# Patient Record
Sex: Female | Born: 1945 | Race: White | Hispanic: No | Smoking: Never smoker
Health system: Southern US, Community
[De-identification: ages and names within clinical notes are randomized; demographics above are authoritative.]

## PROBLEM LIST (undated history)

## (undated) DIAGNOSIS — K219 Gastro-esophageal reflux disease without esophagitis: Secondary | ICD-10-CM

## (undated) DIAGNOSIS — J9690 Respiratory failure, unspecified, unspecified whether with hypoxia or hypercapnia: Secondary | ICD-10-CM

## (undated) DIAGNOSIS — I517 Cardiomegaly: Secondary | ICD-10-CM

## (undated) DIAGNOSIS — S82852A Displaced trimalleolar fracture of left lower leg, initial encounter for closed fracture: Secondary | ICD-10-CM

## (undated) DIAGNOSIS — F429 Obsessive-compulsive disorder, unspecified: Secondary | ICD-10-CM

## (undated) DIAGNOSIS — T8142XA Infection following a procedure, deep incisional surgical site, initial encounter: Secondary | ICD-10-CM

## (undated) DIAGNOSIS — D649 Anemia, unspecified: Secondary | ICD-10-CM

## (undated) DIAGNOSIS — R7989 Other specified abnormal findings of blood chemistry: Secondary | ICD-10-CM

## (undated) DIAGNOSIS — J189 Pneumonia, unspecified organism: Secondary | ICD-10-CM

## (undated) DIAGNOSIS — R000 Tachycardia, unspecified: Secondary | ICD-10-CM

## (undated) DIAGNOSIS — A419 Sepsis, unspecified organism: Secondary | ICD-10-CM

## (undated) DIAGNOSIS — R6520 Severe sepsis without septic shock: Secondary | ICD-10-CM

## (undated) DIAGNOSIS — Z8614 Personal history of Methicillin resistant Staphylococcus aureus infection: Secondary | ICD-10-CM

## (undated) DIAGNOSIS — F1110 Opioid abuse, uncomplicated: Secondary | ICD-10-CM

## (undated) DIAGNOSIS — M1990 Unspecified osteoarthritis, unspecified site: Secondary | ICD-10-CM

## (undated) DIAGNOSIS — R7881 Bacteremia: Secondary | ICD-10-CM

## (undated) DIAGNOSIS — E876 Hypokalemia: Secondary | ICD-10-CM

## (undated) DIAGNOSIS — F419 Anxiety disorder, unspecified: Secondary | ICD-10-CM

## (undated) DIAGNOSIS — R42 Dizziness and giddiness: Secondary | ICD-10-CM

## (undated) DIAGNOSIS — D62 Acute posthemorrhagic anemia: Secondary | ICD-10-CM

## (undated) DIAGNOSIS — N179 Acute kidney failure, unspecified: Secondary | ICD-10-CM

## (undated) DIAGNOSIS — S72142A Displaced intertrochanteric fracture of left femur, initial encounter for closed fracture: Secondary | ICD-10-CM

## (undated) DIAGNOSIS — D72829 Elevated white blood cell count, unspecified: Secondary | ICD-10-CM

## (undated) DIAGNOSIS — I10 Essential (primary) hypertension: Secondary | ICD-10-CM

## (undated) DIAGNOSIS — B019 Varicella without complication: Secondary | ICD-10-CM

## (undated) DIAGNOSIS — R079 Chest pain, unspecified: Secondary | ICD-10-CM

## (undated) DIAGNOSIS — W19XXXA Unspecified fall, initial encounter: Secondary | ICD-10-CM

## (undated) DIAGNOSIS — R0602 Shortness of breath: Secondary | ICD-10-CM

## (undated) DIAGNOSIS — F329 Major depressive disorder, single episode, unspecified: Secondary | ICD-10-CM

## (undated) DIAGNOSIS — F32A Depression, unspecified: Secondary | ICD-10-CM

## (undated) DIAGNOSIS — R55 Syncope and collapse: Secondary | ICD-10-CM

## (undated) HISTORY — DX: Depression, unspecified: F32.A

## (undated) HISTORY — DX: Hypokalemia: E87.6

## (undated) HISTORY — DX: Major depressive disorder, single episode, unspecified: F32.9

## (undated) HISTORY — DX: Unspecified fall, initial encounter: W19.XXXA

## (undated) HISTORY — DX: Varicella without complication: B01.9

## (undated) HISTORY — DX: Personal history of Methicillin resistant Staphylococcus aureus infection: Z86.14

## (undated) HISTORY — DX: Other specified abnormal findings of blood chemistry: R79.89

## (undated) HISTORY — DX: Sepsis, unspecified organism: R65.20

## (undated) HISTORY — DX: Chest pain, unspecified: R07.9

## (undated) HISTORY — DX: Dizziness and giddiness: R42

## (undated) HISTORY — DX: Elevated white blood cell count, unspecified: D72.829

## (undated) HISTORY — DX: Anemia, unspecified: D64.9

## (undated) HISTORY — DX: Sepsis, unspecified organism: A41.9

## (undated) HISTORY — PX: APPENDECTOMY: SHX54

## (undated) HISTORY — DX: Shortness of breath: R06.02

## (undated) HISTORY — PX: JOINT REPLACEMENT: SHX530

## (undated) HISTORY — PX: CHOLECYSTECTOMY OPEN: SUR202

## (undated) HISTORY — DX: Syncope and collapse: R55

## (undated) HISTORY — DX: Displaced trimalleolar fracture of left lower leg, initial encounter for closed fracture: S82.852A

## (undated) HISTORY — DX: Respiratory failure, unspecified, unspecified whether with hypoxia or hypercapnia: J96.90

## (undated) SURGERY — Surgical Case
Anesthesia: *Unknown

---

## 1983-02-01 HISTORY — PX: TOTAL ABDOMINAL HYSTERECTOMY: SHX209

## 1999-03-19 ENCOUNTER — Other Ambulatory Visit: Admission: RE | Admit: 1999-03-19 | Discharge: 1999-03-19 | Payer: Self-pay | Admitting: Obstetrics & Gynecology

## 1999-03-23 ENCOUNTER — Encounter: Payer: Self-pay | Admitting: Obstetrics & Gynecology

## 1999-03-23 ENCOUNTER — Ambulatory Visit (HOSPITAL_COMMUNITY): Admission: RE | Admit: 1999-03-23 | Discharge: 1999-03-23 | Payer: Self-pay | Admitting: Obstetrics & Gynecology

## 1999-09-20 ENCOUNTER — Encounter: Admission: RE | Admit: 1999-09-20 | Discharge: 1999-09-20 | Payer: Self-pay | Admitting: Orthopaedic Surgery

## 1999-09-20 ENCOUNTER — Encounter: Payer: Self-pay | Admitting: Orthopaedic Surgery

## 2009-11-27 ENCOUNTER — Ambulatory Visit: Payer: Self-pay | Admitting: Ophthalmology

## 2009-12-08 ENCOUNTER — Ambulatory Visit: Payer: Self-pay | Admitting: Ophthalmology

## 2009-12-17 ENCOUNTER — Encounter: Admission: RE | Admit: 2009-12-17 | Discharge: 2009-12-17 | Payer: Self-pay | Admitting: Neurology

## 2011-06-15 DIAGNOSIS — W19XXXA Unspecified fall, initial encounter: Secondary | ICD-10-CM

## 2011-06-15 HISTORY — DX: Unspecified fall, initial encounter: W19.XXXA

## 2011-06-17 ENCOUNTER — Encounter: Payer: Self-pay | Admitting: Cardiology

## 2011-07-20 ENCOUNTER — Ambulatory Visit (INDEPENDENT_AMBULATORY_CARE_PROVIDER_SITE_OTHER): Payer: Medicare Other | Admitting: Cardiology

## 2011-07-20 VITALS — BP 130/54 | HR 74 | Ht 62.0 in | Wt 121.0 lb

## 2011-07-20 DIAGNOSIS — Z8249 Family history of ischemic heart disease and other diseases of the circulatory system: Secondary | ICD-10-CM

## 2011-07-20 DIAGNOSIS — I2089 Other forms of angina pectoris: Secondary | ICD-10-CM

## 2011-07-20 DIAGNOSIS — R55 Syncope and collapse: Secondary | ICD-10-CM

## 2011-07-20 DIAGNOSIS — R002 Palpitations: Secondary | ICD-10-CM

## 2011-07-20 DIAGNOSIS — E7800 Pure hypercholesterolemia, unspecified: Secondary | ICD-10-CM

## 2011-07-20 DIAGNOSIS — R079 Chest pain, unspecified: Secondary | ICD-10-CM

## 2011-07-20 DIAGNOSIS — I208 Other forms of angina pectoris: Secondary | ICD-10-CM

## 2011-07-20 NOTE — Assessment & Plan Note (Signed)
Her symptoms are very concerning for coronary disease. Her primary risk factors are her family history and mild hypercholesterolemia. We will schedule her for a stress echo to further stratify her risk. If abnormal we will need to proceed with cardiac catheterization. It is possible that her chest pain symptoms could be related to an arrhythmia. If her stress test is normal we will place an event monitor.

## 2011-07-20 NOTE — Progress Notes (Signed)
[name] Date of Birth: 1945/12/28 Medical Record #[id number]  History of Present Illness: [name] date is seen at the request of Elin Claggett [facility] for evaluation of chest pain and palpitations. She is a 66 year old white female who has a strong family history of early coronary disease. She reports that over the past 2 weeks she has had problems with her heart racing fast. This is associated with severe dizziness and near syncope. Also associated with sweating. This is worse with exertion. With these symptoms she is also experienced severe chest discomfort like something is sitting on her chest. This is a pressure in her left precordium. If she stops and rests her symptoms resolved within 15-20 minutes. The symptoms also are associated with exertion. She has noticed this when she is mowing grass when she walks out to feed her dogs. She has and prescribe nitroglycerin has tried this on 3 occasions with improvement in her chest pain symptoms. She complains that she get short of breath very easily. She feels like she has run a marathon. She denies any prior cardiac history. She denies a history of hypertension, diabetes, hyperlipidemia, or tobacco use. She reports she had a stress test 10 years ago for some similar symptoms and that it was unremarkable.  Current Outpatient Prescriptions on File Prior to Visit  Medication Sig Dispense Refill  . FLUVOXAMINE MALEATE PO Take 50 mg by mouth at bedtime.      . nitroGLYCERIN (NITROSTAT) 0.4 MG SL tablet Place 0.4 mg under the tongue every 5 (five) minutes as needed.      . sertraline (ZOLOFT) 50 MG tablet Take 50 mg by mouth daily.        Allergies  Allergen Reactions  . Sulfa Antibiotics     Past Medical History  Diagnosis Date  . Chicken pox   . Chest pain   . SOB (shortness of breath) on exertion   . Fall 06/15/2011    Caused discomfort in lower left rib and left lateral hip area  . H/O: hysterectomy 1985  . Depressive disorder   .  Pre-syncope   . Dizziness     Past Surgical History  Procedure Date  . Gallbladder surgery   . Appendectomy   . Total abdominal hysterectomy     History  Smoking status  . Never Smoker   Smokeless tobacco  . Not on file    History  Alcohol Use No    Family History  Problem Relation Age of Onset  . Heart attack Mother   . Colon cancer Father   . Diabetes Brother   . Heart attack Brother 19    MI    Review of Systems: The review of systems is positive for a recent cyst removal from her left anterior chest.  All other systems were reviewed and are negative.  Physical Exam: BP 130/54  Pulse 74  Ht 5\' 2"  (1.575 m)  Wt 121 lb (54.885 kg)  BMI 22.13 kg/m2 She is a pleasant, thin white female in no acute distress. She is normocephalic, atraumatic. Pupils are equal round and reactive to light accommodation. Extraocular movements are intact. Oropharynx is clear. Neck is supple no JVD, adenopathy, thyromegaly, or bruits. Lungs are clear. Cardiac exam reveals a regular rate and rhythm without gallop, murmur, or click. She has a bandage on her left anterior chest. Abdomen is soft and nontender without masses or bruits. Bowel sounds are positive. There is no hepatosplenomegaly. Femoral and pedal pulses are 2+ and  symmetric. She has no edema. Skin is warm and dry. She is alert oriented x3. Cranial nerves II through XII are intact.   LABORATORY DATA: ECG today is normal. Recent CBC reveals a white count of 7300, hemoglobin 11.9, platelet count 217,000. Total cholesterol was 186, triglycerides 71, LDL 119, HDL 53. Complete chemistry panel was normal. Recent x-ray left hip showed mild degenerative changes. X-ray of the left ribs showed a minimally displaced fracture of the ninth rib.  Assessment / Plan:

## 2011-07-20 NOTE — Patient Instructions (Signed)
We will schedule you for a stress Echo. If abnormal we will need to do a cardiac cath. If normal we will have you wear a monitor.  Take an ASA 81 mg daily.

## 2011-07-22 ENCOUNTER — Encounter: Payer: Self-pay | Admitting: Cardiology

## 2011-07-22 ENCOUNTER — Ambulatory Visit (HOSPITAL_COMMUNITY): Payer: Medicare Other | Attending: Cardiovascular Disease

## 2011-07-22 DIAGNOSIS — Z8249 Family history of ischemic heart disease and other diseases of the circulatory system: Secondary | ICD-10-CM | POA: Insufficient documentation

## 2011-07-22 DIAGNOSIS — R079 Chest pain, unspecified: Secondary | ICD-10-CM

## 2011-07-22 DIAGNOSIS — R072 Precordial pain: Secondary | ICD-10-CM

## 2011-07-22 DIAGNOSIS — R55 Syncope and collapse: Secondary | ICD-10-CM | POA: Insufficient documentation

## 2011-07-22 DIAGNOSIS — R002 Palpitations: Secondary | ICD-10-CM | POA: Insufficient documentation

## 2011-07-22 NOTE — Progress Notes (Signed)
Echocardiogram performed.  

## 2011-07-25 ENCOUNTER — Encounter: Payer: Self-pay | Admitting: Cardiology

## 2011-07-27 ENCOUNTER — Other Ambulatory Visit: Payer: Self-pay

## 2011-07-27 DIAGNOSIS — R002 Palpitations: Secondary | ICD-10-CM

## 2011-07-29 ENCOUNTER — Telehealth: Payer: Self-pay | Admitting: Cardiology

## 2011-07-29 NOTE — Telephone Encounter (Signed)
Please return call to patient at [phone].

## 2011-07-29 NOTE — Telephone Encounter (Signed)
Patient came to office event monitor was put on.Patient called back to tell me monitor serial # [id number].

## 2011-07-31 ENCOUNTER — Encounter: Payer: Self-pay | Admitting: Cardiology

## 2011-07-31 DIAGNOSIS — R002 Palpitations: Secondary | ICD-10-CM

## 2011-09-20 ENCOUNTER — Ambulatory Visit (INDEPENDENT_AMBULATORY_CARE_PROVIDER_SITE_OTHER): Payer: Medicare Other | Admitting: Cardiology

## 2011-09-20 ENCOUNTER — Encounter: Payer: Self-pay | Admitting: Cardiology

## 2011-09-20 VITALS — BP 140/68 | HR 70 | Ht 62.0 in | Wt 123.4 lb

## 2011-09-20 DIAGNOSIS — R002 Palpitations: Secondary | ICD-10-CM

## 2011-09-20 DIAGNOSIS — R079 Chest pain, unspecified: Secondary | ICD-10-CM

## 2011-09-20 NOTE — Patient Instructions (Signed)
Start regular aerobic walking daily to build up your stamina  I will see you back as needed.

## 2011-09-20 NOTE — Progress Notes (Signed)
[name] Date of Birth: 02-Sep-1945 Medical Record #[id number]  History of Present Illness: [name] date is seen  today to followup on her cardiac studies. She does note that when she were the event monitor she had several episodes were her heart felt like it was racing and she activated the monitor. She denies any significant chest pain today.  Current Outpatient Prescriptions on File Prior to Visit  Medication Sig Dispense Refill  . diazepam (VALIUM) 5 MG tablet Take 5 mg by mouth as directed.      Marland Kitchen FLUoxetine (PROZAC) 40 MG capsule Take 40 mg by mouth as directed.       Marland Kitchen FLUVOXAMINE MALEATE PO Take 50 mg by mouth at bedtime.      . nitroGLYCERIN (NITROSTAT) 0.4 MG SL tablet Place 0.4 mg under the tongue every 5 (five) minutes as needed.      . sertraline (ZOLOFT) 50 MG tablet Take 50 mg by mouth daily.        Allergies  Allergen Reactions  . Sulfa Antibiotics     Past Medical History  Diagnosis Date  . Chicken pox   . Chest pain   . SOB (shortness of breath) on exertion   . Fall 06/15/2011    Caused discomfort in lower left rib and left lateral hip area  . H/O: hysterectomy 1985  . Depressive disorder   . Pre-syncope   . Dizziness     Past Surgical History  Procedure Date  . Gallbladder surgery   . Appendectomy   . Total abdominal hysterectomy     History  Smoking status  . Never Smoker   Smokeless tobacco  . Not on file    History  Alcohol Use No    Family History  Problem Relation Age of Onset  . Heart attack Mother   . Colon cancer Father   . Diabetes Brother   . Heart attack Brother 35    MI    Review of Systems: As noted in history of present illness.  All other systems were reviewed and are negative.  Physical Exam: BP 140/68  Pulse 70  Ht 5\' 2"  (1.575 m)  Wt 55.974 kg (123 lb 6.4 oz)  BMI 22.57 kg/m2  SpO2 99% She is a pleasant, thin white female in no acute distress. She is normocephalic, atraumatic. Pupils are equal round  and reactive to light accommodation. Extraocular movements are intact. Oropharynx is clear. Neck is supple no JVD, adenopathy, thyromegaly, or bruits. Lungs are clear. Cardiac exam reveals a regular rate and rhythm without gallop, murmur, or click. She has a bandage on her left anterior chest. Abdomen is soft and nontender without masses or bruits. Bowel sounds are positive. There is no hepatosplenomegaly. Femoral and pedal pulses are 2+ and symmetric. She has no edema. Skin is warm and dry. She is alert oriented x3. Cranial nerves II through XII are intact.   LABORATORY DATA: Her event monitor showed multiple recordings withdrawal sinus rhythm with sinus tachycardia. Maximal pulse rate was 117.  Stress echo was normal. Ejection fraction was normal. Patient is able to exercise into stage III.  Assessment / Plan:  1. Palpitations. Event monitor demonstrates only sinus rhythm with sinus tachycardia. I've reassured her concerning these findings.  2. Chest pain. Stress echo was normal. I recommend continued risk factor modification. I would like for her to begin a regular aerobic walking program to try and build up her stamina and conditioning. Continue to work on risk  factor modification. If she notices a significant change in her pattern of chest pain I asked that she call me.  Peter Swaziland MD, [facility]

## 2013-07-23 ENCOUNTER — Emergency Department (HOSPITAL_COMMUNITY)
Admission: EM | Admit: 2013-07-23 | Discharge: 2013-07-23 | Disposition: A | Discharge status: Home / Self Care | Payer: Medicare Other | Attending: Emergency Medicine | Admitting: Emergency Medicine

## 2013-07-23 ENCOUNTER — Emergency Department (HOSPITAL_COMMUNITY): Payer: Medicare Other

## 2013-07-23 ENCOUNTER — Encounter (HOSPITAL_COMMUNITY): Payer: Self-pay | Admitting: Emergency Medicine

## 2013-07-23 DIAGNOSIS — R002 Palpitations: Secondary | ICD-10-CM | POA: Insufficient documentation

## 2013-07-23 DIAGNOSIS — R51 Headache: Secondary | ICD-10-CM | POA: Insufficient documentation

## 2013-07-23 DIAGNOSIS — F329 Major depressive disorder, single episode, unspecified: Secondary | ICD-10-CM | POA: Diagnosis not present

## 2013-07-23 DIAGNOSIS — Z79899 Other long term (current) drug therapy: Secondary | ICD-10-CM | POA: Insufficient documentation

## 2013-07-23 DIAGNOSIS — R5383 Other fatigue: Principal | ICD-10-CM

## 2013-07-23 DIAGNOSIS — R5381 Other malaise: Secondary | ICD-10-CM | POA: Diagnosis present

## 2013-07-23 DIAGNOSIS — Z8619 Personal history of other infectious and parasitic diseases: Secondary | ICD-10-CM | POA: Diagnosis not present

## 2013-07-23 DIAGNOSIS — R42 Dizziness and giddiness: Secondary | ICD-10-CM | POA: Insufficient documentation

## 2013-07-23 DIAGNOSIS — R110 Nausea: Secondary | ICD-10-CM | POA: Insufficient documentation

## 2013-07-23 DIAGNOSIS — R531 Weakness: Secondary | ICD-10-CM

## 2013-07-23 DIAGNOSIS — R55 Syncope and collapse: Secondary | ICD-10-CM | POA: Insufficient documentation

## 2013-07-23 DIAGNOSIS — F3289 Other specified depressive episodes: Secondary | ICD-10-CM | POA: Diagnosis not present

## 2013-07-23 LAB — URINALYSIS, ROUTINE W REFLEX MICROSCOPIC
Bilirubin Urine: NEGATIVE
Glucose, UA: NEGATIVE mg/dL
Ketones, ur: NEGATIVE mg/dL
NITRITE: NEGATIVE
Protein, ur: NEGATIVE mg/dL
SPECIFIC GRAVITY, URINE: 1.01 (ref 1.005–1.030)
UROBILINOGEN UA: 0.2 mg/dL (ref 0.0–1.0)
pH: 6 (ref 5.0–8.0)

## 2013-07-23 LAB — CBC WITH DIFFERENTIAL/PLATELET
Basophils Absolute: 0 10*3/uL (ref 0.0–0.1)
Basophils Relative: 0 % (ref 0–1)
EOS ABS: 0.1 10*3/uL (ref 0.0–0.7)
EOS PCT: 1 % (ref 0–5)
HEMATOCRIT: 36.7 % (ref 36.0–46.0)
Hemoglobin: 12.4 g/dL (ref 12.0–15.0)
LYMPHS ABS: 1.4 10*3/uL (ref 0.7–4.0)
LYMPHS PCT: 19 % (ref 12–46)
MCH: 30.3 pg (ref 26.0–34.0)
MCHC: 33.8 g/dL (ref 30.0–36.0)
MCV: 89.7 fL (ref 78.0–100.0)
MONO ABS: 0.6 10*3/uL (ref 0.1–1.0)
Monocytes Relative: 8 % (ref 3–12)
Neutro Abs: 5.4 10*3/uL (ref 1.7–7.7)
Neutrophils Relative %: 72 % (ref 43–77)
PLATELETS: 221 10*3/uL (ref 150–400)
RBC: 4.09 MIL/uL (ref 3.87–5.11)
RDW: 13.2 % (ref 11.5–15.5)
WBC: 7.6 10*3/uL (ref 4.0–10.5)

## 2013-07-23 LAB — COMPREHENSIVE METABOLIC PANEL
ALT: 12 U/L (ref 0–35)
AST: 17 U/L (ref 0–37)
Albumin: 4.2 g/dL (ref 3.5–5.2)
Alkaline Phosphatase: 131 U/L — ABNORMAL HIGH (ref 39–117)
BUN: 21 mg/dL (ref 6–23)
CALCIUM: 9.3 mg/dL (ref 8.4–10.5)
CO2: 26 mEq/L (ref 19–32)
Chloride: 101 mEq/L (ref 96–112)
Creatinine, Ser: 1.08 mg/dL (ref 0.50–1.10)
GFR calc Af Amer: 60 mL/min — ABNORMAL LOW (ref >90)
GFR, EST NON AFRICAN AMERICAN: 52 mL/min — AB (ref >90)
GLUCOSE: 117 mg/dL — AB (ref 70–99)
Potassium: 4.2 mEq/L (ref 3.7–5.3)
Sodium: 140 mEq/L (ref 137–147)
Total Bilirubin: 0.3 mg/dL (ref 0.3–1.2)
Total Protein: 7.5 g/dL (ref 6.0–8.3)

## 2013-07-23 LAB — URINE MICROSCOPIC-ADD ON

## 2013-07-23 LAB — TROPONIN I: Troponin I: <0.3 ng/mL (ref <0.30)

## 2013-07-23 MED ORDER — ACETAMINOPHEN 325 MG PO TABS
650.0000 mg | ORAL_TABLET | Freq: Once | ORAL | Status: AC
  Administered 2013-07-23: 650 mg via ORAL
  Filled 2013-07-23: qty 2

## 2013-07-23 MED ORDER — ONDANSETRON HCL 4 MG/2ML IJ SOLN
4.0000 mg | Freq: Once | INTRAMUSCULAR | Status: AC
  Administered 2013-07-23: 4 mg via INTRAVENOUS
  Filled 2013-07-23: qty 2

## 2013-07-23 MED ORDER — SODIUM CHLORIDE 0.9 % IV BOLUS (SEPSIS)
1000.0000 mL | Freq: Once | INTRAVENOUS | Status: AC
  Administered 2013-07-23: 1000 mL via INTRAVENOUS

## 2013-07-23 NOTE — ED Provider Notes (Signed)
CSN: [id number]     Arrival date & time 07/23/13  0857 History   First MD Initiated Contact with Patient 07/23/13 [phone]     Chief Complaint  Patient presents with  . Weakness     (Consider location/radiation/quality/duration/timing/severity/associated sxs/prior Treatment) HPI Comments: [name] is a 68 y.o. Female with a prior history of dizziness of unclear etiology presenting with dizziness and near syncope occuring 1 hour before arrival.  She woke this morning without symptoms but reports about after 20 minutes outside while feeding her dogs she developed lightheadedness "almost drunk feeling" and fell to the ground without loosing consciousness.  At the same time,  She developed a frontal headache, nausea and felt her heart was racing.  She denies chest pain and shortness of breath, also is without abdominal pain.  She has had no po intake this morning, but ate a normal dinner last night.  She denies any reason to be dehydrated, but was very hot while outside this morning.  She denies injury or pain secondary to the fall.  She has had no medicines prior to arrival.   Review of chart indicates she had cardiac w/u by Dr SwazilandJordan August 2013 for palpitations.  An event recorder documented several episodes of sinus tachycardia with maximum rate of 117.  Stress echo was normal. Ejection fraction was normal. Patient was able to exercise into stage III.      The history is provided by the patient.    Past Medical History  Diagnosis Date  . Chicken pox   . Chest pain   . SOB (shortness of breath) on exertion   . Fall 06/15/2011    Caused discomfort in lower left rib and left lateral hip area  . H/O: hysterectomy 1985  . Depressive disorder   . Pre-syncope   . Dizziness    Past Surgical History  Procedure Laterality Date  . Gallbladder surgery    . Appendectomy    . Total abdominal hysterectomy     Family History  Problem Relation Age of Onset  . Heart attack Mother   . Colon  cancer Father   . Diabetes Brother   . Heart attack Brother 1961    MI   History  Substance Use Topics  . Smoking status: Never Smoker   . Smokeless tobacco: Not on file  . Alcohol Use: No   OB History   Grav Para Term Preterm Abortions TAB SAB Ect Mult Living                 Review of Systems  Constitutional: Negative for fever.  HENT: Negative for congestion and sore throat.   Eyes: Negative.   Respiratory: Negative for chest tightness and shortness of breath.   Cardiovascular: Positive for palpitations. Negative for chest pain.  Gastrointestinal: Positive for nausea. Negative for vomiting and abdominal pain.  Genitourinary: Negative.   Musculoskeletal: Negative for arthralgias, joint swelling and neck pain.  Skin: Negative.  Negative for rash and wound.  Neurological: Positive for dizziness, weakness and headaches. Negative for light-headedness and numbness.  Psychiatric/Behavioral: Negative.       Allergies  Sulfa antibiotics  Home Medications   Prior to Admission medications   Medication Sig Start Date End Date Taking? Authorizing Shafter Jupin  diazepam (VALIUM) 5 MG tablet Take 5 mg by mouth as directed.    Historical Atlee Kluth, MD  FLUoxetine (PROZAC) 40 MG capsule Take 40 mg by mouth as directed.  09/05/11   Historical Kristain Hu, MD  FLUVOXAMINE  MALEATE PO Take 50 mg by mouth at bedtime.    Historical Khylan Sawyer, MD  nitroGLYCERIN (NITROSTAT) 0.4 MG SL tablet Place 0.4 mg under the tongue every 5 (five) minutes as needed.    Historical Colton Engdahl, MD  sertraline (ZOLOFT) 50 MG tablet Take 50 mg by mouth daily.    Historical Emi Lymon, MD   BP 131/49  Pulse 63  Temp(Src) 97.6 F (36.4 C) (Oral)  Resp 16  Ht 5\' 2"  (1.575 m)  Wt 119 lb (53.978 kg)  BMI 21.76 kg/m2  SpO2 100% Physical Exam  Nursing note and vitals reviewed. Constitutional: She is oriented to person, place, and time. She appears well-developed and well-nourished.  HENT:  Head: Normocephalic and  atraumatic.  Eyes: Conjunctivae and EOM are normal. Pupils are equal, round, and reactive to light.  Neck: Normal range of motion.  Cardiovascular: Normal rate, regular rhythm, normal heart sounds and intact distal pulses.   Pulmonary/Chest: Effort normal and breath sounds normal. She has no wheezes. She has no rales. She exhibits no tenderness.  Abdominal: Soft. Bowel sounds are normal. There is no tenderness.  Musculoskeletal: Normal range of motion. She exhibits no edema.  Neurological: She is alert and oriented to person, place, and time. She has normal strength. No sensory deficit. She exhibits normal muscle tone.  Equal grip strength.  Skin: Skin is warm and dry.  Psychiatric: She has a normal mood and affect.    ED Course  Procedures (including critical care time) Labs Review Labs Reviewed  CBC WITH DIFFERENTIAL  COMPREHENSIVE METABOLIC PANEL  TROPONIN I  URINALYSIS, ROUTINE W REFLEX MICROSCOPIC    Imaging Review No results found.   EKG Interpretation   Date/Time:  Tuesday July 23 2013 09:18:26 EDT Ventricular Rate:  67 PR Interval:  128 QRS Duration: 83 QT Interval:  393 QTC Calculation: 415 R Axis:   82 Text Interpretation:  Sinus rhythm Borderline right axis deviation  Confirmed by ZAMMIT  MD, JOSEPH ([id number]) on 07/23/2013 9:25:54 AM      MDM   Final diagnoses:  None    9:46 AM Husband now at bedside,  Stating he was out with his wife when the event happened.  He believes she passed out as she did not respond to him for a second.  He states they were in the shade and it was particularly hot where they were working.  10:54 AM Husband adds at this time that patient has a history of psych problems.  She is currently taking valium and zoloft for this but ran out of her valium early,  Last dose was taken 7 days ago.  She cannot get this refilled until the 28th. He also states that there have been a few beers missing from the refrig.  Patient denies consuming any  etoh.    UA, orthostatics, second troponin completed with no abnormal findings.  Discussed pt with Dr Estell HarpinZammit who agrees patient is stable for dc home.  She was given instruction to f/u with pcp this week for a recheck,  In the interim,  Advised to increase fluid intake,  Avoid heat.  Return here with any worsened sx in the interim.  The patient appears reasonably screened and/or stabilized for discharge and I doubt any other medical condition or other [facility] further screening, evaluation, or treatment in the ED at this time prior to discharge.   Burgess AmorJulie Idol, PA-C 07/23/13 1652

## 2013-07-23 NOTE — Discharge Instructions (Signed)
Near-Syncope Near-syncope (commonly known as near fainting) is sudden weakness, dizziness, or feeling like you might pass out. During an episode of near-syncope, you may also develop pale skin, have tunnel vision, or feel sick to your stomach (nauseous). Near-syncope may occur when getting up after sitting or while standing for a long time. It is caused by a sudden decrease in blood flow to the brain. This decrease can result from various causes or triggers, most of which are not serious. However, because near-syncope can sometimes be a sign of something serious, a medical evaluation is required. The specific cause is often not determined. HOME CARE INSTRUCTIONS  Monitor your condition for any changes. The following actions may help to alleviate any discomfort you are experiencing:  Have someone stay with you until you feel stable.  Lie down right away and prop your feet up if you start feeling like you might faint. Breathe deeply and steadily. Wait until all the symptoms have passed. Most of these episodes last only a few minutes. You may feel tired for several hours.   Drink enough fluids to keep your urine clear or pale yellow.   If you are taking blood pressure or heart medicine, get up slowly when seated or lying down. Take several minutes to sit and then stand. This can reduce dizziness.  Follow up with your health care provider as directed. SEEK IMMEDIATE MEDICAL CARE IF:   You have a severe headache.   You have unusual pain in the chest, abdomen, or back.   You are bleeding from the mouth or rectum, or you have black or tarry stool.   You have an irregular or very fast heartbeat.   You have repeated fainting or have seizure-like jerking during an episode.   You faint when sitting or lying down.   You have confusion.   You have difficulty walking.   You have severe weakness.   You have vision problems.  MAKE SURE YOU:   Understand these instructions.  Will  watch your condition.  Will get help right away if you are not doing well or get worse. Document Released: 01/17/2005 Document Revised: 01/22/2013 Document Reviewed: 06/22/2012 [facility] Patient Information 2015 [employer] [place]. This information is not intended to replace advice given to you by your health care provider. Make sure you discuss any questions you have with your health care provider.   Make sure you are drinking plenty of fluids.  Stay out of the heat.  followup with your doctor a recheck of your symptoms this week.

## 2013-07-23 NOTE — ED Notes (Signed)
Patient did state that she was dizzy upon sitting and standing.  Patient did not have any difficulty standing while vital signs being taken.

## 2013-07-23 NOTE — ED Notes (Signed)
Pt reports she woke up this morning with a feeling of weakness, "like I'm gonna fall out", and chest palpitations. Denies chest pain/pressure and SOB.

## 2013-07-24 NOTE — ED Provider Notes (Signed)
Medical screening examination/treatment/procedure(s) were performed by non-physician practitioner and as supervising physician I was immediately available for consultation/collaboration.   EKG Interpretation   Date/Time:  Tuesday July 23 2013 09:18:26 EDT Ventricular Rate:  67 PR Interval:  128 QRS Duration: 83 QT Interval:  393 QTC Calculation: 415 R Axis:   82 Text Interpretation:  Sinus rhythm Borderline right axis deviation  Confirmed by ZAMMIT  MD, JOSEPH ([id number]) on 07/23/2013 9:25:54 AM        Benny LennertJoseph L Zammit, MD 07/24/13 [phone]

## 2014-03-21 ENCOUNTER — Emergency Department: Payer: Self-pay | Admitting: Emergency Medicine

## 2014-05-18 ENCOUNTER — Inpatient Hospital Stay (HOSPITAL_COMMUNITY)
Admission: EM | Admit: 2014-05-18 | Discharge: 2014-05-18 | DRG: 312 | Disposition: A | Discharge status: Home / Self Care | Payer: Medicare Other | Attending: Emergency Medicine | Admitting: Emergency Medicine

## 2014-05-18 ENCOUNTER — Encounter (HOSPITAL_COMMUNITY): Payer: Self-pay | Admitting: Emergency Medicine

## 2014-05-18 DIAGNOSIS — F329 Major depressive disorder, single episode, unspecified: Secondary | ICD-10-CM | POA: Diagnosis present

## 2014-05-18 DIAGNOSIS — Z79899 Other long term (current) drug therapy: Secondary | ICD-10-CM | POA: Diagnosis not present

## 2014-05-18 DIAGNOSIS — E860 Dehydration: Secondary | ICD-10-CM | POA: Diagnosis present

## 2014-05-18 DIAGNOSIS — R0902 Hypoxemia: Secondary | ICD-10-CM | POA: Diagnosis present

## 2014-05-18 DIAGNOSIS — R55 Syncope and collapse: Secondary | ICD-10-CM | POA: Diagnosis not present

## 2014-05-18 DIAGNOSIS — F42 Obsessive-compulsive disorder: Secondary | ICD-10-CM | POA: Diagnosis present

## 2014-05-18 DIAGNOSIS — F419 Anxiety disorder, unspecified: Secondary | ICD-10-CM | POA: Diagnosis present

## 2014-05-18 HISTORY — DX: Obsessive-compulsive disorder, unspecified: F42.9

## 2014-05-18 HISTORY — DX: Opioid abuse, uncomplicated: F11.10

## 2014-05-18 HISTORY — DX: Anxiety disorder, unspecified: F41.9

## 2014-05-18 LAB — I-STAT TROPONIN, ED: TROPONIN I, POC: 0.01 ng/mL (ref 0.00–0.08)

## 2014-05-18 LAB — CBC WITH DIFFERENTIAL/PLATELET
Basophils Absolute: 0 10*3/uL (ref 0.0–0.1)
Basophils Relative: 0 % (ref 0–1)
Eosinophils Absolute: 0 10*3/uL (ref 0.0–0.7)
Eosinophils Relative: 0 % (ref 0–5)
HCT: 33.2 % — ABNORMAL LOW (ref 36.0–46.0)
Hemoglobin: 11.2 g/dL — ABNORMAL LOW (ref 12.0–15.0)
LYMPHS ABS: 0.7 10*3/uL (ref 0.7–4.0)
LYMPHS PCT: 9 % — AB (ref 12–46)
MCH: 30.5 pg (ref 26.0–34.0)
MCHC: 33.7 g/dL (ref 30.0–36.0)
MCV: 90.5 fL (ref 78.0–100.0)
Monocytes Absolute: 0.4 10*3/uL (ref 0.1–1.0)
Monocytes Relative: 6 % (ref 3–12)
NEUTROS PCT: 85 % — AB (ref 43–77)
Neutro Abs: 6.8 10*3/uL (ref 1.7–7.7)
PLATELETS: 224 10*3/uL (ref 150–400)
RBC: 3.67 MIL/uL — AB (ref 3.87–5.11)
RDW: 12.9 % (ref 11.5–15.5)
WBC: 8 10*3/uL (ref 4.0–10.5)

## 2014-05-18 LAB — I-STAT CHEM 8, ED
BUN: 17 mg/dL (ref 6–23)
Calcium, Ion: 1.12 mmol/L — ABNORMAL LOW (ref 1.13–1.30)
Chloride: 98 mmol/L (ref 96–112)
Creatinine, Ser: 0.9 mg/dL (ref 0.50–1.10)
Glucose, Bld: 128 mg/dL — ABNORMAL HIGH (ref 70–99)
HCT: 43 % (ref 36.0–46.0)
Hemoglobin: 14.6 g/dL (ref 12.0–15.0)
Potassium: 4.4 mmol/L (ref 3.5–5.1)
Sodium: 133 mmol/L — ABNORMAL LOW (ref 135–145)
TCO2: 20 mmol/L (ref 0–100)

## 2014-05-18 LAB — BASIC METABOLIC PANEL
ANION GAP: 13 (ref 5–15)
BUN: 13 mg/dL (ref 6–23)
CALCIUM: 8.7 mg/dL (ref 8.4–10.5)
CO2: 20 mmol/L (ref 19–32)
Chloride: 98 mmol/L (ref 96–112)
Creatinine, Ser: 0.95 mg/dL (ref 0.50–1.10)
GFR calc non Af Amer: 60 mL/min — ABNORMAL LOW (ref >90)
GFR, EST AFRICAN AMERICAN: 69 mL/min — AB (ref >90)
Glucose, Bld: 127 mg/dL — ABNORMAL HIGH (ref 70–99)
Potassium: 4.5 mmol/L (ref 3.5–5.1)
Sodium: 131 mmol/L — ABNORMAL LOW (ref 135–145)

## 2014-05-18 LAB — CBG MONITORING, ED: Glucose-Capillary: 96 mg/dL (ref 70–99)

## 2014-05-18 MED ORDER — SODIUM CHLORIDE 0.9 % IV BOLUS (SEPSIS)
1000.0000 mL | Freq: Once | INTRAVENOUS | Status: AC
  Administered 2014-05-18: 1000 mL via INTRAVENOUS

## 2014-05-18 NOTE — ED Notes (Signed)
Pt cbg 96 

## 2014-05-18 NOTE — ED Provider Notes (Signed)
CSN: [id number]     Arrival date & time 05/18/14  1141 History   First MD Initiated Contact with Patient 05/18/14 1141     Chief Complaint  Patient presents with  . Loss of Consciousness     (Consider location/radiation/quality/duration/timing/severity/associated sxs/prior Treatment) HPI Comments: Patient with no cardiac history -- presents with complaint of syncopal episode while at church today. Patient reports feeling lightheaded like she was going to pass out for several seconds and then awoke with several other people around her assisting her. Patient daughter at bedside states that she was told she was nonresponsive for approximately 5 minutes. No seizure-like activity noted. No head injury. She did not have chest pain, shortness of breath at any time. She does not have a history of congestive heart failure. Patient had a workup for palpitations in 2013 and had a normal stress test and an event monitor which only showed sinus tachycardia at that time. Patient with no other cardiac history. She had a similar episode of syncope and lightheadedness last year and again this February. She was treated for dehydration at that time. Patient states that she does not probably drink as much as she should. There is a question of alcohol use per the patient's daughter. Patient denies any recent alcohol use. Daughter is also concerned that the patient is not taking care of herself as well as she should. The onset of this condition was acute. The course is resolved. Aggravating factors: none. Alleviating factors: none.    Patient is a 69 y.o. female presenting with syncope. The history is provided by the patient and a relative.  Loss of Consciousness Associated symptoms: no chest pain, no diaphoresis, no fever, no headaches, no nausea, no palpitations, no seizures, no shortness of breath and no vomiting     Past Medical History  Diagnosis Date  . Chicken pox   . Chest pain   . SOB (shortness of breath)  on exertion   . Fall 06/15/2011    Caused discomfort in lower left rib and left lateral hip area  . H/O: hysterectomy 1985  . Depressive disorder   . Pre-syncope   . Dizziness   . OCD (obsessive compulsive disorder)   . Anxiety   . Opioid abuse    Past Surgical History  Procedure Laterality Date  . Gallbladder surgery    . Appendectomy    . Total abdominal hysterectomy     Family History  Problem Relation Age of Onset  . Heart attack Mother   . Colon cancer Father   . Diabetes Brother   . Heart attack Brother 10    MI   History  Substance Use Topics  . Smoking status: Never Smoker   . Smokeless tobacco: Not on file  . Alcohol Use: No   OB History    No data available     Review of Systems  Constitutional: Negative for fever and diaphoresis.  Eyes: Negative for redness.  Respiratory: Negative for cough and shortness of breath.   Cardiovascular: Positive for syncope. Negative for chest pain, palpitations and leg swelling.  Gastrointestinal: Negative for nausea, vomiting and abdominal pain.  Genitourinary: Negative for dysuria.  Musculoskeletal: Negative for back pain and neck pain.  Skin: Negative for rash.  Neurological: Positive for syncope and light-headedness. Negative for seizures and headaches.  Psychiatric/Behavioral: The patient is not nervous/anxious.     Allergies  Sulfa antibiotics  Home Medications   Prior to Admission medications   Medication Sig Start Date End  Date Taking? Authorizing Provider  Aspirin-Salicylamide-Caffeine (BC HEADACHE POWDER PO) Take 2 each by mouth as needed (headaches).   Yes Historical Provider, MD  FLUoxetine (PROZAC) 20 MG capsule Take 20 mg by mouth daily.   Yes Historical Provider, MD  naltrexone (DEPADE) 50 MG tablet Take 50 mg by mouth daily.   Yes Historical Provider, MD   BP 143/63 mmHg  Pulse 75  Temp(Src) 97.8 F (36.6 C) (Oral)  Resp 18  SpO2 95%   Physical Exam  Constitutional: She appears well-developed  and well-nourished.  HENT:  Head: Normocephalic and atraumatic.  Mouth/Throat: Mucous membranes are normal. Mucous membranes are not dry.  Eyes: Conjunctivae are normal.  Neck: Trachea normal and normal range of motion. Neck supple. Normal carotid pulses and no JVD present. No muscular tenderness present. Carotid bruit is not present. No tracheal deviation present.  Cardiovascular: Normal rate, regular rhythm, S1 normal, S2 normal, normal heart sounds and intact distal pulses.  Exam reveals no decreased pulses.   No murmur heard. Pulmonary/Chest: Effort normal. No respiratory distress. She has no wheezes. She exhibits no tenderness.  Abdominal: Soft. Normal aorta and bowel sounds are normal. There is no tenderness. There is no rebound and no guarding.  Musculoskeletal: Normal range of motion.  Neurological: She is alert.  Skin: Skin is warm and dry. She is not diaphoretic. No cyanosis. No pallor.  Psychiatric: She has a normal mood and affect.  Nursing note and vitals reviewed.   ED Course  Procedures (including critical care time) Labs Review Labs Reviewed  BASIC METABOLIC PANEL - Abnormal; Notable for the following:    Sodium 131 (*)    Glucose, Bld 127 (*)    GFR calc non Af Amer 60 (*)    GFR calc Af Amer 69 (*)    All other components within normal limits  CBC WITH DIFFERENTIAL/PLATELET - Abnormal; Notable for the following:    RBC 3.67 (*)    Hemoglobin 11.2 (*)    HCT 33.2 (*)    Neutrophils Relative % 85 (*)    Lymphocytes Relative 9 (*)    All other components within normal limits  I-STAT CHEM 8, ED - Abnormal; Notable for the following:    Sodium 133 (*)    Glucose, Bld 128 (*)    Calcium, Ion 1.12 (*)    All other components within normal limits  CBG MONITORING, ED  I-STAT TROPOININ, ED    Imaging Review No results found.   12:55 PM Patient seen and examined. Work-up initiated. Medications ordered.   Vital signs reviewed and are as follows: BP 143/63 mmHg   Pulse 75  Temp(Src) 97.8 F (36.6 C) (Oral)  Resp 18  SpO2 95%  ED ECG REPORT   Date: 05/18/2014  Rate: 85  Rhythm: normal sinus rhythm  QRS Axis: normal  Intervals: normal  ST/T Wave abnormalities: normal  Conduction Disutrbances:none  Narrative Interpretation:   Old EKG Reviewed: unchanged  I have personally reviewed the EKG tracing and agree with the computerized printout as noted.   3:42 PM Patient discussed with and seen by Dr. Gwendolyn Grant prior to discharge.   Patient has done well during ED stay. She is not orthostatic and not symptomatic. Three family members at bedside. We discussed results. They are comfortable with monitoring patient at home, ensuring that she eats and drinks well. They were willing sure that she follows up with PCP early this coming week for recheck. They were told to return to the emergency Department  immediately with additional episodes of syncope or lightheadedness, chest pain, shortness of breath, new symptoms or other concerns. Patient and family state understanding and agrees with plan.  MDM   Final diagnoses:  Syncope, unspecified syncope type   Patient with syncopal episode while at church with positive prodrome. SF syncope negative. Episode is not suggestive of cardiac syncope. Patient has had cardiac workup in the past couple of years. She has had several similar episodes to this. EKG unremarkable. Do not feel strongly that patient requires inpatient admission for observation at this time. She has not had any chest pain, shortness of breath, or other concerning accompanying symptoms. Patient has PCP follow-up. She has family that can monitor her and ensure that she hydrates and does not have return of symptoms. Will d/c to home.  No dangerous or life-threatening conditions suspected or identified by history, physical exam, and by work-up. No indications for hospitalization identified.     Renne CriglerJoshua Joni Norrod, PA-C 05/18/14 1549  Elwin MochaBlair Walden,  MD 05/18/14 [phone]

## 2014-05-18 NOTE — Discharge Instructions (Signed)
Please read and follow all provided instructions.  Your diagnoses today include:  1. Syncope, unspecified syncope type    Tests performed today include:  An EKG of your heart - no significant changes  Cardiac enzymes - a blood test for heart muscle damage  Blood counts and electrolytes  Vital signs. See below for your results today.   Medications prescribed:   None  Take any prescribed medications only as directed.  Follow-up instructions: Please follow-up with your primary care provider as soon as you can for further evaluation of your symptoms.   Please hydrate yourself well and rest over the next 24-48 hours.   Return instructions:  SEEK IMMEDIATE MEDICAL ATTENTION IF:  You have severe chest pain, especially if the pain is crushing or pressure-like and spreads to the arms, back, neck, or jaw, or if you have sweating, nausea (feeling sick to your stomach), or shortness of breath. THIS IS AN EMERGENCY. Don't wait to see if the pain will go away. Get medical help at once. Call 911 or 0 (operator). DO NOT drive yourself to the hospital.   Your chest pain gets worse and does not go away with rest.   You have an attack of chest pain lasting longer than usual, despite rest and treatment with the medications your caregiver has prescribed.   You wake from sleep with chest pain or shortness of breath.  You feel dizzy or faint again.   You have chest pain not typical of your usual pain for which you originally saw your caregiver.   You have any other emergent concerns regarding your health.    Your vital signs today were: BP 161/63 mmHg   Pulse 90   Temp(Src) 97.8 F (36.6 C) (Oral)   Resp 18   SpO2 99% If your blood pressure (BP) was elevated above 135/85 this visit, please have this repeated by your doctor within one month. --------------

## 2014-05-18 NOTE — ED Notes (Signed)
Pt from church via [facility] with c/o syncopal episode while sitting in Sunday school, no precipitating factors.  Pt has a hx of syncopy preceded by dizziness caused by dehydtration 3-4 months ago.  Pt given 300 mL NS, was not orthostatic per EMS.  Pt remembers waking up from people calling her name.  Pt in NAD, A&O.

## 2014-05-18 NOTE — ED Notes (Signed)
Pt reports feeling dizzy while sitting prior to the syncope for approx 10 mins.

## 2014-08-07 ENCOUNTER — Observation Stay (HOSPITAL_COMMUNITY)
Admission: EM | Admit: 2014-08-07 | Discharge: 2014-08-08 | Disposition: A | Discharge status: Home / Self Care | Payer: Medicare Other | Attending: Family Medicine | Admitting: Family Medicine

## 2014-08-07 ENCOUNTER — Encounter (HOSPITAL_COMMUNITY): Payer: Self-pay | Admitting: Emergency Medicine

## 2014-08-07 ENCOUNTER — Emergency Department (HOSPITAL_COMMUNITY): Payer: Medicare Other

## 2014-08-07 DIAGNOSIS — R112 Nausea with vomiting, unspecified: Secondary | ICD-10-CM | POA: Diagnosis not present

## 2014-08-07 DIAGNOSIS — R0789 Other chest pain: Secondary | ICD-10-CM | POA: Diagnosis not present

## 2014-08-07 DIAGNOSIS — F419 Anxiety disorder, unspecified: Secondary | ICD-10-CM | POA: Diagnosis not present

## 2014-08-07 DIAGNOSIS — F42 Obsessive-compulsive disorder: Secondary | ICD-10-CM | POA: Diagnosis not present

## 2014-08-07 DIAGNOSIS — E7800 Pure hypercholesterolemia, unspecified: Secondary | ICD-10-CM | POA: Diagnosis present

## 2014-08-07 DIAGNOSIS — Z79899 Other long term (current) drug therapy: Secondary | ICD-10-CM | POA: Insufficient documentation

## 2014-08-07 DIAGNOSIS — F329 Major depressive disorder, single episode, unspecified: Secondary | ICD-10-CM | POA: Insufficient documentation

## 2014-08-07 DIAGNOSIS — N179 Acute kidney failure, unspecified: Secondary | ICD-10-CM

## 2014-08-07 DIAGNOSIS — E871 Hypo-osmolality and hyponatremia: Secondary | ICD-10-CM

## 2014-08-07 DIAGNOSIS — R0602 Shortness of breath: Secondary | ICD-10-CM | POA: Diagnosis not present

## 2014-08-07 DIAGNOSIS — Z8619 Personal history of other infectious and parasitic diseases: Secondary | ICD-10-CM | POA: Insufficient documentation

## 2014-08-07 DIAGNOSIS — D649 Anemia, unspecified: Secondary | ICD-10-CM | POA: Diagnosis not present

## 2014-08-07 DIAGNOSIS — R079 Chest pain, unspecified: Principal | ICD-10-CM | POA: Insufficient documentation

## 2014-08-07 DIAGNOSIS — R739 Hyperglycemia, unspecified: Secondary | ICD-10-CM | POA: Diagnosis not present

## 2014-08-07 HISTORY — DX: Acute kidney failure, unspecified: N17.9

## 2014-08-07 LAB — BASIC METABOLIC PANEL
ANION GAP: 10 (ref 5–15)
BUN: 14 mg/dL (ref 6–20)
CHLORIDE: 97 mmol/L — AB (ref 101–111)
CO2: 23 mmol/L (ref 22–32)
Calcium: 8.8 mg/dL — ABNORMAL LOW (ref 8.9–10.3)
Creatinine, Ser: 1.29 mg/dL — ABNORMAL HIGH (ref 0.44–1.00)
GFR calc Af Amer: 48 mL/min — ABNORMAL LOW (ref >60)
GFR calc non Af Amer: 41 mL/min — ABNORMAL LOW (ref >60)
Glucose, Bld: 122 mg/dL — ABNORMAL HIGH (ref 65–99)
POTASSIUM: 4.7 mmol/L (ref 3.5–5.1)
Sodium: 130 mmol/L — ABNORMAL LOW (ref 135–145)

## 2014-08-07 LAB — IRON AND TIBC
IRON: 30 ug/dL (ref 28–170)
Saturation Ratios: 8 % — ABNORMAL LOW (ref 10.4–31.8)
TIBC: 389 ug/dL (ref 250–450)
UIBC: 359 ug/dL

## 2014-08-07 LAB — I-STAT TROPONIN, ED: Troponin i, poc: 0 ng/mL (ref 0.00–0.08)

## 2014-08-07 LAB — GLUCOSE, CAPILLARY: Glucose-Capillary: 122 mg/dL — ABNORMAL HIGH (ref 65–99)

## 2014-08-07 LAB — LIPID PANEL
CHOLESTEROL: 152 mg/dL (ref 0–200)
HDL: 76 mg/dL (ref >40)
LDL Cholesterol: 69 mg/dL (ref 0–99)
Total CHOL/HDL Ratio: 2 RATIO
Triglycerides: 36 mg/dL (ref <150)
VLDL: 7 mg/dL (ref 0–40)

## 2014-08-07 LAB — CBC
HCT: 34.3 % — ABNORMAL LOW (ref 36.0–46.0)
HEMATOCRIT: 34 % — AB (ref 36.0–46.0)
HEMOGLOBIN: 11.7 g/dL — AB (ref 12.0–15.0)
Hemoglobin: 11.5 g/dL — ABNORMAL LOW (ref 12.0–15.0)
MCH: 31.3 pg (ref 26.0–34.0)
MCH: 31.2 pg (ref 26.0–34.0)
MCHC: 34.1 g/dL (ref 30.0–36.0)
MCHC: 33.8 g/dL (ref 30.0–36.0)
MCV: 91.7 fL (ref 78.0–100.0)
MCV: 92.1 fL (ref 78.0–100.0)
PLATELETS: 277 10*3/uL (ref 150–400)
PLATELETS: 240 10*3/uL (ref 150–400)
RBC: 3.74 MIL/uL — ABNORMAL LOW (ref 3.87–5.11)
RBC: 3.69 MIL/uL — AB (ref 3.87–5.11)
RDW: 13.6 % (ref 11.5–15.5)
RDW: 13.7 % (ref 11.5–15.5)
WBC: 7.1 10*3/uL (ref 4.0–10.5)
WBC: 8.9 10*3/uL (ref 4.0–10.5)

## 2014-08-07 LAB — TROPONIN I

## 2014-08-07 LAB — CREATININE, SERUM
CREATININE: 1.14 mg/dL — AB (ref 0.44–1.00)
GFR calc Af Amer: 56 mL/min — ABNORMAL LOW (ref >60)
GFR calc non Af Amer: 48 mL/min — ABNORMAL LOW (ref >60)

## 2014-08-07 LAB — VITAMIN B12: Vitamin B-12: 194 pg/mL (ref 180–914)

## 2014-08-07 MED ORDER — ONDANSETRON HCL 4 MG/2ML IJ SOLN: 4.0000 mg | Freq: Four times a day (QID) | INTRAMUSCULAR | Status: DC | PRN

## 2014-08-07 MED ORDER — GI COCKTAIL ~~LOC~~: 30.0000 mL | Freq: Four times a day (QID) | ORAL | Status: DC | PRN

## 2014-08-07 MED ORDER — ASPIRIN EC 81 MG PO TBEC
81.0000 mg | DELAYED_RELEASE_TABLET | Freq: Every day | ORAL | Status: DC
  Administered 2014-08-08: 81 mg via ORAL
  Filled 2014-08-07: qty 1

## 2014-08-07 MED ORDER — MORPHINE SULFATE 2 MG/ML IJ SOLN: 2.0000 mg | INTRAMUSCULAR | Status: DC | PRN

## 2014-08-07 MED ORDER — ACETAMINOPHEN 325 MG PO TABS
650.0000 mg | ORAL_TABLET | ORAL | Status: DC | PRN
  Administered 2014-08-07 – 2014-08-08 (×2): 650 mg via ORAL
  Filled 2014-08-07 (×2): qty 2

## 2014-08-07 MED ORDER — ASPIRIN 325 MG PO TABS
325.0000 mg | ORAL_TABLET | ORAL | Status: AC
  Administered 2014-08-07: 325 mg via ORAL
  Filled 2014-08-07: qty 1

## 2014-08-07 MED ORDER — INSULIN ASPART 100 UNIT/ML ~~LOC~~ SOLN: 0.0000 [IU] | Freq: Three times a day (TID) | SUBCUTANEOUS | Status: DC

## 2014-08-07 MED ORDER — HYDROXYZINE HCL 25 MG PO TABS
50.0000 mg | ORAL_TABLET | Freq: Two times a day (BID) | ORAL | Status: DC | PRN
  Administered 2014-08-07 – 2014-08-08 (×2): 50 mg via ORAL
  Filled 2014-08-07 (×3): qty 2

## 2014-08-07 MED ORDER — MORPHINE SULFATE 4 MG/ML IJ SOLN
4.0000 mg | Freq: Once | INTRAMUSCULAR | Status: AC
  Administered 2014-08-07: 4 mg via INTRAVENOUS
  Filled 2014-08-07: qty 1

## 2014-08-07 MED ORDER — ZOLPIDEM TARTRATE 5 MG PO TABS: 5.0000 mg | ORAL_TABLET | Freq: Every evening | ORAL | Status: DC | PRN

## 2014-08-07 MED ORDER — IBUPROFEN 600 MG PO TABS
600.0000 mg | ORAL_TABLET | Freq: Four times a day (QID) | ORAL | Status: DC | PRN
  Administered 2014-08-07 – 2014-08-08 (×2): 600 mg via ORAL
  Filled 2014-08-07 (×2): qty 1

## 2014-08-07 MED ORDER — SODIUM CHLORIDE 0.9 % IV SOLN
INTRAVENOUS | Status: DC
  Administered 2014-08-07: 22:00:00 via INTRAVENOUS

## 2014-08-07 MED ORDER — FLUOXETINE HCL 20 MG PO CAPS
20.0000 mg | ORAL_CAPSULE | Freq: Every day | ORAL | Status: DC
  Administered 2014-08-08: 20 mg via ORAL
  Filled 2014-08-07 (×3): qty 1

## 2014-08-07 MED ORDER — LORAZEPAM 1 MG PO TABS
1.0000 mg | ORAL_TABLET | Freq: Once | ORAL | Status: AC
  Administered 2014-08-07: 1 mg via ORAL
  Filled 2014-08-07: qty 1

## 2014-08-07 NOTE — ED Notes (Signed)
Spoke with Dr. Welton FlakesKhan about patients continued chest pain since she has a telemetry bed ordered, advised that he does not feel it is cardiac and that if [facility] is concerned about taking her with chest pain upon this RN calling report to have them page him.

## 2014-08-07 NOTE — ED Notes (Signed)
Pt arrives from home via [facility] for c/o n/v/dizziness, sob, headache and left sided chest pain. Pt reports symptoms began yesterday and chest pain has gotten worse today. Pt alert, orientedx4, nad.

## 2014-08-07 NOTE — ED Notes (Signed)
Attempted report to [facility], patient has not been assigned to a nurse yet, secretary advised someone would call back to get report soon.

## 2014-08-07 NOTE — Progress Notes (Signed)
RN notified this provider about pt's severe anxiety and report of chest pain. Pt has already received Morphine IV 4mg  and Ativan 1mg . Pt history is pertinent for multiple provider visits with chest pain, diaphoresis, and palpitations with apparent anxiety and depression. Chart review clarifies that those visits resulted in ruling out cardiac origin. Pt has a history of psychotropic medication management with changes of meds indicating probably ineffectiveness of regimen and persistent depression/anxiety. Nursing staff feels pt is somatic/anxious. Additionally, family report history of such. Pt and family trying to avoid morphine.   Plan: -Vistaril 50mg  po bid prn severe anxiety -Ibuprofen 600mg  po q6h prn mild-moderate pain  Beau FannyWithrow, Jamaria Amborn C, FNP 08/07/2014    11:28PM

## 2014-08-07 NOTE — ED Provider Notes (Signed)
CSN: [id number]     Arrival date & time 08/07/14  1628 History   First MD Initiated Contact with Patient 08/07/14 1628     Chief Complaint  Patient presents with  . Chest Pain     (Consider location/radiation/quality/duration/timing/severity/associated sxs/prior Treatment) HPI Comments: Patient presents to the ED with a chief complaint of chest pain.  She states that the symptoms started yesterday.  They have been progressively worsening.  She reports associated SOB, dizziness, n/v, and diaphoresis.  She has taken aspirin today.  She has not taken anything else for her symptoms.  She states that the pain is described as a tightness.  She states that she has been out of her Prozac.    The history is provided by the patient. No language interpreter was used.    Past Medical History  Diagnosis Date  . Chicken pox   . Chest pain   . SOB (shortness of breath) on exertion   . Fall 06/15/2011    Caused discomfort in lower left rib and left lateral hip area  . H/O: hysterectomy 1985  . Depressive disorder   . Pre-syncope   . Dizziness   . OCD (obsessive compulsive disorder)   . Anxiety   . Opioid abuse    Past Surgical History  Procedure Laterality Date  . Gallbladder surgery    . Appendectomy    . Total abdominal hysterectomy     Family History  Problem Relation Age of Onset  . Heart attack Mother   . Colon cancer Father   . Diabetes Brother   . Heart attack Brother [id number]    MI   History  Substance Use Topics  . Smoking status: Never Smoker   . Smokeless tobacco: Not on file  . Alcohol Use: No   OB History    No data available     Review of Systems  Constitutional: Negative for fever and chills.  Respiratory: Positive for shortness of breath.   Cardiovascular: Positive for chest pain.  Gastrointestinal: Positive for nausea and vomiting. Negative for diarrhea and constipation.  Genitourinary: Negative for dysuria.  Neurological: Positive for dizziness.  All other  systems reviewed and are negative.     Allergies  Sulfa antibiotics  Home Medications   Prior to Admission medications   Medication Sig Start Date End Date Taking? Authorizing Provider  Aspirin-Salicylamide-Caffeine (BC HEADACHE POWDER PO) Take 2 each by mouth as needed (headaches).    Historical Provider, MD  FLUoxetine (PROZAC) 20 MG capsule Take 20 mg by mouth daily.    Historical Provider, MD  naltrexone (DEPADE) 50 MG tablet Take 50 mg by mouth daily.    Historical Provider, MD   BP 115/70 mmHg  Pulse 95  Temp(Src) 98.8 F (37.1 C) (Oral)  Resp 20  Ht 5\' 2"  (1.575 m)  Wt 110 lb (49.896 kg)  BMI 20.11 kg/m2  SpO2 100% Physical Exam  Constitutional: She is oriented to person, place, and time. She appears well-developed and well-nourished.  HENT:  Head: Normocephalic and atraumatic.  Eyes: Conjunctivae and EOM are normal. Pupils are equal, round, and reactive to light.  Neck: Normal range of motion. Neck supple.  Cardiovascular: Normal rate and regular rhythm.  Exam reveals no gallop and no friction rub.   No murmur heard. Pulmonary/Chest: Effort normal and breath sounds normal. No respiratory distress. She has no wheezes. She has no rales. She exhibits no tenderness.  Abdominal: Soft. Bowel sounds are normal. She exhibits no distension and no mass.  There is no tenderness. There is no rebound and no guarding.  Musculoskeletal: Normal range of motion. She exhibits no edema or tenderness.  Neurological: She is alert and oriented to person, place, and time.  Skin: Skin is warm and dry.  Psychiatric: She has a normal mood and affect. Her behavior is normal. Judgment and thought content normal.  Nursing note and vitals reviewed.   ED Course  Procedures (including critical care time) Results for orders placed or performed during the hospital encounter of 08/07/14  CBC  Result Value Ref Range   WBC 7.1 4.0 - 10.5 K/uL   RBC 3.74 (L) 3.87 - 5.11 MIL/uL   Hemoglobin 11.7 (L)  12.0 - 15.0 g/dL   HCT 40.9 (L) 81.1 - 91.4 %   MCV 91.7 78.0 - 100.0 fL   MCH 31.3 26.0 - 34.0 pg   MCHC 34.1 30.0 - 36.0 g/dL   RDW 78.2 95.6 - 21.3 %   Platelets 277 150 - 400 K/uL  Basic metabolic panel  Result Value Ref Range   Sodium 130 (L) 135 - 145 mmol/L   Potassium 4.7 3.5 - 5.1 mmol/L   Chloride 97 (L) 101 - 111 mmol/L   CO2 23 22 - 32 mmol/L   Glucose, Bld 122 (H) 65 - 99 mg/dL   BUN 14 6 - 20 mg/dL   Creatinine, Ser 0.86 (H) 0.44 - 1.00 mg/dL   Calcium 8.8 (L) 8.9 - 10.3 mg/dL   GFR calc non Af Amer 41 (L) >60 mL/min   GFR calc Af Amer 48 (L) >60 mL/min   Anion gap 10 5 - 15  Lipid panel  Result Value Ref Range   Cholesterol 152 0 - 200 mg/dL   Triglycerides 36 <578 mg/dL   HDL 76 >46 mg/dL   Total CHOL/HDL Ratio 2.0 RATIO   VLDL 7 0 - 40 mg/dL   LDL Cholesterol 69 0 - 99 mg/dL  Iron and TIBC  Result Value Ref Range   Iron 30 28 - 170 ug/dL   TIBC 962 952 - 841 ug/dL   Saturation Ratios 8 (L) 10.4 - 31.8 %   UIBC 359 ug/dL  Vitamin L24  Result Value Ref Range   Vitamin B-12 194 180 - 914 pg/mL  Troponin I-serum (0, 3, 6 hours)  Result Value Ref Range   Troponin I <0.03 <0.031 ng/mL  CBC  Result Value Ref Range   WBC 8.9 4.0 - 10.5 K/uL   RBC 3.69 (L) 3.87 - 5.11 MIL/uL   Hemoglobin 11.5 (L) 12.0 - 15.0 g/dL   HCT 40.1 (L) 02.7 - 25.3 %   MCV 92.1 78.0 - 100.0 fL   MCH 31.2 26.0 - 34.0 pg   MCHC 33.8 30.0 - 36.0 g/dL   RDW 66.4 40.3 - 47.4 %   Platelets 240 150 - 400 K/uL  Creatinine, serum  Result Value Ref Range   Creatinine, Ser 1.14 (H) 0.44 - 1.00 mg/dL   GFR calc non Af Amer 48 (L) >60 mL/min   GFR calc Af Amer 56 (L) >60 mL/min  Glucose, capillary  Result Value Ref Range   Glucose-Capillary 122 (H) 65 - 99 mg/dL  I-stat troponin, ED  (not at [facility], [facility])  Result Value Ref Range   Troponin i, poc 0.00 0.00 - 0.08 ng/mL   Comment 3           Dg Chest Port 1 View  08/07/2014   CLINICAL DATA:  Shortness of breath and  chest pain, left chest   EXAM: PORTABLE CHEST - 1 VIEW  COMPARISON:  July 23, 2013  FINDINGS: Lungs are borderline hyperexpanded. There is mild atelectasis in the left base. Lungs elsewhere clear. Heart size and pulmonary vascularity are normal. No adenopathy. No bone lesions.  IMPRESSION: Lungs borderline hyperexpanded. Mild atelectasis left base. Lungs otherwise clear.   Electronically Signed   By: Bretta Bang III M.D.   On: 08/07/2014 17:26     Imaging Review No results found.   EKG Interpretation   Date/Time:  Thursday August 07 2014 16:33:07 EDT Ventricular Rate:  94 PR Interval:  107 QRS Duration: 98 QT Interval:  359 QTC Calculation: 449 R Axis:   78 Text Interpretation:  Sinus rhythm Short PR interval RSR' in V1 or V2,  right VCD or RVH No significant change since last tracing Confirmed by  [facility]  MD, BLAIR [phone]) on 08/07/2014 4:45:27 PM      MDM   Final diagnoses:  Chest pain, unspecified chest pain type     Patient with chest pain, shortness of breath, and diaphoresis. Symptoms concerning for ACS. Will admit the patient for observation and chest pain rule out.    Roxy Horseman, PA-C 08/08/14 9604  Elwin Mocha, MD 08/08/14 [phone]

## 2014-08-07 NOTE — H&P (Signed)
[facility] History and Physical  Madison AbtsJudy L Sallie ZOX:[id number] DOB: 07/03/1945 DOA: 08/07/2014  Referring physician: Elwin MochaBlair Walden, MD PCP: Theodora BlowHall, Michelle D, FNP   Chief Complaint: Chest Pain  HPI: [name] is a 69 y.o. female with history of hyperlipidemia presents with chest pain. She states that the pain started yesterday and has been going on all day today as well. She states that the pain is on the left side of her chest. Pain seems to radiate to the left arm and also seems to go to the neck. She states there has been some dizziness and some shortness of breath. She states that she has had a headache also with the pain. She has not had any syncope. She did feel dizziness and faint. She states that she has had pain in the past and also has had dizzy spells in the past. She states the she had morphine in the ED as well as ativan and there has been no improvement. She did not have any diaphoresis and she actually looks quite comfortable. She does have chronic headaches and has been taking a lot of [employer] Powder. She states that she does take the [employer] Powder twice a day and this is almost daily. She does not smoke but is exposed to second hand smoke. She does drink beer regularly every chance she gets. Her daughter states that she does have OCD also.   Review of Systems:  Constitutional:  No weight loss, night sweats, Fevers +fatigue.  HEENT:  +headaches, itching, ear ache, nasal congestion, post nasal drip,  Cardio-vascular:  +chest pain, no swelling in lower extremities, anasarca, +dizziness, +palpitations  GI:  +heartburn, +indigestion, +nausea no diarrhea  Resp:  +shortness of breath No coughing up of blood.No wheezing  Skin:  no rash or lesions.  GU:  no dysuria, change in color of urine Musculoskeletal:  No joint pain or swelling Psych:  No change in mood or affect. +anxiety  Past Medical History  Diagnosis Date  . Chicken pox   . Chest pain   . SOB (shortness of  breath) on exertion   . Fall 06/15/2011    Caused discomfort in lower left rib and left lateral hip area  . H/O: hysterectomy 1985  . Depressive disorder   . Pre-syncope   . Dizziness   . OCD (obsessive compulsive disorder)   . Anxiety   . Opioid abuse    Past Surgical History  Procedure Laterality Date  . Gallbladder surgery    . Appendectomy    . Total abdominal hysterectomy     Social History:  reports that she has never smoked. She does not have any smokeless tobacco history on file. She reports that she does not drink alcohol or use illicit drugs.  Allergies  Allergen Reactions  . Sulfa Antibiotics     Hives     Family History  Problem Relation Age of Onset  . Heart attack Mother   . Colon cancer Father   . Diabetes Brother   . Heart attack Brother [id number]    MI     Prior to Admission medications   Medication Sig Start Date End Date Taking? Authorizing Provider  Aspirin-Salicylamide-Caffeine (BC HEADACHE POWDER PO) Take 2 each by mouth as needed (headaches).   Yes Historical Provider, MD  FLUoxetine (PROZAC) 20 MG capsule Take 20 mg by mouth daily.   Yes Historical Provider, MD   Physical Exam: Filed Vitals:   08/07/14 1815 08/07/14 1830 08/07/14 1900 08/07/14 1916  BP: 145/57 149/51 140/54 117/49  Pulse: 83 87 83 86  Temp:      TempSrc:      Resp:   20 15  Height:      Weight:      SpO2: 100% 99% 99% 100%    Wt Readings from Last 3 Encounters:  08/07/14 49.896 kg (110 lb)  07/23/13 53.978 kg (119 lb)  09/20/11 55.974 kg (123 lb 6.4 oz)    General:  Appears calm and comfortable Eyes: PERRL, normal lids, irises & conjunctiva ENT: grossly normal hearing, lips & tongue Neck: no LAD, masses or thyromegaly Cardiovascular: RRR, no m/r/g. No LE edema. Respiratory: CTA bilaterally, no w/r Abdomen: soft, ntnd Skin: no rash or induration seen on limited exam Musculoskeletal: grossly normal tone BUE/BLE Psychiatric: grossly normal mood and affect Neurologic:  grossly non-focal.          Labs on Admission:  Basic Metabolic Panel:  Recent Labs Lab 08/07/14 1714  NA 130*  K 4.7  CL 97*  CO2 23  GLUCOSE 122*  BUN 14  CREATININE 1.29*  CALCIUM 8.8*   Liver Function Tests: No results for input(s): AST, ALT, ALKPHOS, BILITOT, PROT, ALBUMIN in the last 168 hours. No results for input(s): LIPASE, AMYLASE in the last 168 hours. No results for input(s): AMMONIA in the last 168 hours. CBC:  Recent Labs Lab 08/07/14 1714  WBC 7.1  HGB 11.7*  HCT 34.3*  MCV 91.7  PLT 277   Cardiac Enzymes: No results for input(s): CKTOTAL, CKMB, CKMBINDEX, TROPONINI in the last 168 hours.  BNP (last 3 results) No results for input(s): BNP in the last 8760 hours.  ProBNP (last 3 results) No results for input(s): PROBNP in the last 8760 hours.  CBG: No results for input(s): GLUCAP in the last 168 hours.  Radiological Exams on Admission: Dg Chest Port 1 View  08/07/2014   CLINICAL DATA:  Shortness of breath and chest pain, left chest  EXAM: PORTABLE CHEST - 1 VIEW  COMPARISON:  July 23, 2013  FINDINGS: Lungs are borderline hyperexpanded. There is mild atelectasis in the left base. Lungs elsewhere clear. Heart size and pulmonary vascularity are normal. No adenopathy. No bone lesions.  IMPRESSION: Lungs borderline hyperexpanded. Mild atelectasis left base. Lungs otherwise clear.   Electronically Signed   By: Bretta Bang III M.D.   On: 08/07/2014 17:26      Assessment/Plan Principal Problem:   Chest pain Active Problems:   Hypercholesterolemia   Anemia   AKI (acute kidney injury)   Hyperglycemia   Hyponatremia   1. Chest Pain -atypical pain with no changes noted on ECG and negative enzymes and has had prior cardiac workup but due to her underlying Psych history will need further evaluation -will admit for observation -check serial enzymes -will get echo in am  2. Hyperlipidemia -will check lipid panel -not on any meds  presently  3. Anemia -check stool occult blood -will check iron studies B12 Folate  4. AKI -this is a new finding from prior labs -will hydrate and repeat labs family notes that she has had poor fluid intake  5. Hyperglycemia -not known to be diabetic -will check A1C -will monitor FSBS  6. Hyponatremia -started on IV NS  -repeat labs in am     Code Status: Full code (must indicate code status--if unknown or must be presumed, indicate so) DVT Prophylaxis:SCD Family Communication: none (indicate person spoken with, if applicable, with phone number if by telephone) Disposition Plan:  Home (indicate anticipated LOS)  Time spent: Obs  [facility] A [facility] Pager [phone]

## 2014-08-08 ENCOUNTER — Ambulatory Visit (HOSPITAL_BASED_OUTPATIENT_CLINIC_OR_DEPARTMENT_OTHER): Payer: Medicare Other

## 2014-08-08 DIAGNOSIS — D649 Anemia, unspecified: Secondary | ICD-10-CM | POA: Diagnosis not present

## 2014-08-08 DIAGNOSIS — R0789 Other chest pain: Secondary | ICD-10-CM

## 2014-08-08 DIAGNOSIS — E780 Pure hypercholesterolemia: Secondary | ICD-10-CM | POA: Diagnosis not present

## 2014-08-08 DIAGNOSIS — R079 Chest pain, unspecified: Secondary | ICD-10-CM | POA: Diagnosis not present

## 2014-08-08 DIAGNOSIS — F4322 Adjustment disorder with anxiety: Secondary | ICD-10-CM

## 2014-08-08 DIAGNOSIS — F419 Anxiety disorder, unspecified: Secondary | ICD-10-CM | POA: Diagnosis not present

## 2014-08-08 DIAGNOSIS — F450 Somatization disorder: Secondary | ICD-10-CM

## 2014-08-08 LAB — TROPONIN I
Troponin I: <0.03 ng/mL (ref <0.031)
Troponin I: <0.03 ng/mL (ref <0.031)

## 2014-08-08 LAB — GLUCOSE, CAPILLARY
GLUCOSE-CAPILLARY: 95 mg/dL (ref 65–99)
Glucose-Capillary: 95 mg/dL (ref 65–99)

## 2014-08-08 MED ORDER — FLUOXETINE HCL 20 MG PO CAPS: 20.0000 mg | ORAL_CAPSULE | Freq: Every day | ORAL | Status: DC

## 2014-08-08 MED ORDER — ASPIRIN 81 MG PO TBEC: 81.0000 mg | DELAYED_RELEASE_TABLET | Freq: Every day | ORAL | Status: DC

## 2014-08-08 MED ORDER — BOOST PLUS PO LIQD
237.0000 mL | Freq: Two times a day (BID) | ORAL | Status: DC
  Filled 2014-08-08 (×3): qty 237

## 2014-08-08 NOTE — Consult Note (Signed)
CARDIOLOGY CONSULT NOTE   Patient ID: [name] MRN: [id number], DOB/AGE: 70-02-1945   Admit date: 08/07/2014 Date of Consult: 08/08/2014   Primary Physician: Theodora Blow, FNP Primary Cardiologist: Dr. Peter Swaziland  Pt. Profile  69 year old woman admitted with atypical chest pain  Problem List  Past Medical History  Diagnosis Date  . Chicken pox   . Chest pain   . SOB (shortness of breath) on exertion   . Fall 06/15/2011    Caused discomfort in lower left rib and left lateral hip area  . H/O: hysterectomy 1985  . Depressive disorder   . Pre-syncope   . Dizziness   . OCD (obsessive compulsive disorder)   . Anxiety   . Opioid abuse     Past Surgical History  Procedure Laterality Date  . Gallbladder surgery    . Appendectomy    . Total abdominal hysterectomy       Allergies  Allergies  Allergen Reactions  . Sulfa Antibiotics     Hives     HPI   This 69 year old woman was admitted on 08/07/14 with chest pain.  She states that she initially noted that her heart was beating very rapidly and then she developed some episodic chest discomfort.  She also complained of a severe headache.  She does not have any history of known coronary disease.  She had a stress echocardiogram in 2013 which was normal and showed no evidence of ischemia.  On this admission her EKG shows no ischemic changes and troponins are normal 3.  Inpatient Medications  . aspirin EC  81 mg Oral Daily  . FLUoxetine  20 mg Oral Daily  . insulin aspart  0-15 Units Subcutaneous TID WC    Family History Family History  Problem Relation Age of Onset  . Heart attack Mother   . Colon cancer Father   . Diabetes Brother   . Heart attack Brother 49    MI     Social History History   Social History  . Marital Status: Married    Spouse Name: N/A  . Number of Children: 3  . Years of Education: N/A   Occupational History  . retail-retired    Social History Main Topics  . Smoking status:  Never Smoker   . Smokeless tobacco: Not on file  . Alcohol Use: No  . Drug Use: No  . Sexual Activity: Not on file   Other Topics Concern  . Not on file   Social History Narrative     Review of Systems  General:  No chills, fever, night sweats or weight changes.  Cardiovascular:  No chest pain, dyspnea on exertion, edema, orthopnea, palpitations, paroxysmal nocturnal dyspnea. Dermatological: No rash, lesions/masses Respiratory: No cough, dyspnea Urologic: No hematuria, dysuria Abdominal:   No nausea, vomiting, diarrhea, bright red blood per rectum, melena, or hematemesis Neurologic:  No visual changes, wkns, changes in mental status. All other systems reviewed and are otherwise negative except as noted above.  Physical Exam  Blood pressure 127/47, pulse 68, temperature 97.7 F (36.5 C), temperature source Oral, resp. rate 16, height  (1.575 m), weight 111 lb (50.349 kg), SpO2 100 %.  General: Pleasant, NAD.  Appears older than her stated age Psych: Normal affect. Neuro: Alert and oriented X 3. Moves all extremities spontaneously. HEENT: Normal  Neck: Supple without bruits or JVD. Lungs:  Resp regular and unlabored, CTA.  The patient is tender along the left anterior chest. Heart: RRR no s3,  s4, or murmurs. Abdomen: Soft, non-tender, non-distended, BS + x 4.  Extremities: No clubbing, cyanosis or edema. DP/PT/Radials 2+ and equal bilaterally.  Labs   Recent Labs  08/07/14 2130 08/08/14 0020 08/08/14 0219  TROPONINI <0.03 <0.03 <0.03   Lab Results  Component Value Date   WBC 8.9 08/07/2014   HGB 11.5* 08/07/2014   HCT 34.0* 08/07/2014   MCV 92.1 08/07/2014   PLT 240 08/07/2014     Recent Labs Lab 08/07/14 1714 08/07/14 2130  NA 130*  --   K 4.7  --   CL 97*  --   CO2 23  --   BUN 14  --   CREATININE 1.29* 1.14*  CALCIUM 8.8*  --   GLUCOSE 122*  --    Lab Results  Component Value Date   CHOL 152 08/07/2014   HDL 76 08/07/2014   LDLCALC 69  08/07/2014   TRIG 36 08/07/2014   No results found for: DDIMER  Radiology/Studies  Dg Chest Port 1 View  08/07/2014   CLINICAL DATA:  Shortness of breath and chest pain, left chest  EXAM: PORTABLE CHEST - 1 VIEW  COMPARISON:  July 23, 2013  FINDINGS: Lungs are borderline hyperexpanded. There is mild atelectasis in the left base. Lungs elsewhere clear. Heart size and pulmonary vascularity are normal. No adenopathy. No bone lesions.  IMPRESSION: Lungs borderline hyperexpanded. Mild atelectasis left base. Lungs otherwise clear.   Electronically Signed   By: Bretta BangWilliam  Woodruff III M.D.   On: 08/07/2014 17:26    ECG  Normal sinus rhythm.  No ischemic changes.  Personally reviewed  ASSESSMENT AND PLAN  1.  Atypical chest pain, low likelihood of coronary ischemia. 2.  History of OCD  Recommendation: She will get an echocardiogram today.  If echocardiogram is unremarkable and no segmental wall motion abnormalities, no further inpatient cardiac workup.  Outpatient follow-up with Dr. Peter SwazilandJordan.   Karie SchwalbeSigned, Loeta Herst MD  08/08/2014, 7:51 AM

## 2014-08-08 NOTE — Progress Notes (Signed)
  Echocardiogram 2D Echocardiogram has been performed.  Janalyn HarderWest, Clell Trahan R 08/08/2014, 11:19 AM

## 2014-08-08 NOTE — Progress Notes (Signed)
Initial Nutrition Assessment  DOCUMENTATION CODES:  Non-severe (moderate) malnutrition in context of social or environmental circumstances  INTERVENTION:   (Boost Plus BID)  NUTRITION DIAGNOSIS:  Malnutrition related to social / environmental circumstances as evidenced by mild depletion of body fat, mild depletion of muscle mass.  GOAL:  Patient will meet greater than or equal to 90% of their needs  MONITOR:  PO intake, Supplement acceptance, Labs, Weight trends, Skin, I & O's  REASON FOR ASSESSMENT:  Malnutrition Screening Tool    ASSESSMENT: [name] is a 69 y.o. female with history of hyperlipidemia presents with chest pain. She states that the pain started yesterday and has been going on all day today as well. She states that the pain is on the left side of her chest. Pain seems to radiate to the left arm and also seems to go to the neck. She states there has been some dizziness and some shortness of breath.  Pt admitted with chest pain.   Pt's biggest complaint at time of visit was a headache. She confirms poor appetite and weight loss PTA, however, is unsure of duration of quantity. She reports her UBW is 120# and started noticing weight loss in March, when her husband passed away.   Pt reports she consumed a half of a biscuit and eggs for breakfast. She endorses poor appetite admitting "I was barely eating" PTA. She consumed Boost twice daily PTA and would like to continue supplements during hospitalization. Discussed importance of consuming meals and supplements to promote healing and encouraged continuing supplements at home.  Boost BID  Nutrition-Focused physical exam completed. Findings are mild to moderate fat depletion, mild to moderate muscle depletion, and no edema.   Height:  Ht Readings from Last 1 Encounters:  08/07/14 5\' 2"  (1.575 m)    Weight:  Wt Readings from Last 1 Encounters:  08/08/14 111 lb (50.349 kg)    Ideal Body Weight:  59  kg  Wt Readings from Last 10 Encounters:  08/08/14 111 lb (50.349 kg)  07/23/13 119 lb (53.978 kg)  09/20/11 123 lb 6.4 oz (55.974 kg)  07/20/11 121 lb (54.885 kg)    BMI:  Body mass index is 20.3 kg/(m^2).  Estimated Nutritional Needs:  Kcal:  1500-1700  Protein:  70-80 grams  Fluid:  1.5-1.7 L  Skin:  Reviewed, no issues  Diet Order:  Diet Carb Modified Fluid consistency:: Thin; Room service appropriate?: Yes  EDUCATION NEEDS:  Education needs addressed   Intake/Output Summary (Last 24 hours) at 08/08/14 1212 Last data filed at 08/08/14 0830  Gross per 24 hour  Intake    240 ml  Output    600 ml  Net   -360 ml    Last BM:  08/08/14  Meridian Scherger A. Mayford KnifeWilliams, RD, LDN, CDE Pager: [phone] After hours Pager: [phone]

## 2014-08-08 NOTE — Discharge Summary (Signed)
Physician Discharge Summary  Melbourne AbtsJudy L Dowse ZOX:[id number] DOB: 04/26/1945 DOA: 08/07/2014  PCP: Theodora BlowHall, Michelle D, FNP  Admit date: 08/07/2014 Discharge date: 08/08/2014  Time spent: 40 minutes  Recommendations for Outpatient Follow-up:  1. Refilled Prozac this admission-Her anxiety was etiology of CP  Discharge Diagnoses:  Principal Problem:   Chest pain Active Problems:   Hypercholesterolemia   Anemia   AKI (acute kidney injury)   Hyperglycemia   Hyponatremia   Discharge Condition: fair  Diet recommendation: hh low salt  Filed Weights   08/07/14 1635 08/07/14 2035 08/08/14 0518  Weight: 49.896 kg (110 lb) 50.7 kg (111 lb 12.4 oz) 50.349 kg (111 lb)    History of present illness:  69 y/o ? known h/o manic predominant bipolar with prior h/o 2013 of Event monitor under Dr. SwazilandJOrdan for Tachycardia admitted 08/08/14 for CP r/o with somatic CP and subj ? palpitations SHe ruled out by CE's x 3 for MI Cardiology felt she was low overall riska as she had a nl Stress echo 2013 EKG was wnl NO WM anomaly on echo She ran out of her  Prozac 1 mo prior to admission and hasn't been seen by Dr. [?]Tsu? Her psychaitrist but has an upcoming appt July 26th She was sent home with an Rx of Prozec, 3 refills  Discharge Exam: Filed Vitals:   08/08/14 1144  BP: 139/58  Pulse: 77  Temp: 98.3 F (36.8 C)  Resp: 16    General: alert flat affect no rale rhnchi Cardiovascular:  s1 s 2slightly tachy Respiratory:  clear  Discharge Instructions   Discharge Instructions    Diet - low sodium heart healthy    Complete by:  As directed      Discharge instructions    Complete by:  As directed   NO GOODYS with Asapirin I have refilled your Prozac-try not to run out again if possible See Dr. Patty SermonsBrackbill or SwazilandJordan in follow up Take care and good luck     Increase activity slowly    Complete by:  As directed           Current Discharge Medication List    START taking these medications   Details   aspirin EC 81 MG EC tablet Take 1 tablet (81 mg total) by mouth daily. Qty: 30 tablet, Refills: 0      CONTINUE these medications which have CHANGED   Details  FLUoxetine (PROZAC) 20 MG capsule Take 1 capsule (20 mg total) by mouth daily. Qty: 30 capsule, Refills: 3      STOP taking these medications     Aspirin-Salicylamide-Caffeine (BC HEADACHE POWDER PO)        Allergies  Allergen Reactions  . Sulfa Antibiotics     Hives    Follow-up Information    Follow up with Ronny FlurryBrackbill, Tom, MD. Schedule an appointment as soon as possible for a visit in 1 week.   Specialty:  Cardiology   Contact information:   [address] [place] [address] [phone]       Follow up with Peter SwazilandJordan, MD. Schedule an appointment as soon as possible for a visit in 1 week.   Specialty:  Cardiology   Contact information:   [address] [place] [address] [phone]        The results of significant diagnostics from this hospitalization (including imaging, microbiology, ancillary and laboratory) are listed below for reference.    Significant Diagnostic Studies: Dg Chest Port 1  View  08/07/2014   CLINICAL DATA:  Shortness of breath and chest pain, left chest  EXAM: PORTABLE CHEST - 1 VIEW  COMPARISON:  July 23, 2013  FINDINGS: Lungs are borderline hyperexpanded. There is mild atelectasis in the left base. Lungs elsewhere clear. Heart size and pulmonary vascularity are normal. No adenopathy. No bone lesions.  IMPRESSION: Lungs borderline hyperexpanded. Mild atelectasis left base. Lungs otherwise clear.   Electronically Signed   By: Bretta Bang III M.D.   On: 08/07/2014 17:26    Microbiology: No results found for this or any previous visit (from the past 240 hour(s)).   Labs: Basic Metabolic Panel:  Recent Labs Lab 08/07/14 1714 08/07/14 2130  NA 130*  --   K 4.7  --   CL 97*  --   CO2 23  --   GLUCOSE 122*  --   BUN 14  --   CREATININE  1.29* 1.14*  CALCIUM 8.8*  --    Liver Function Tests: No results for input(s): AST, ALT, ALKPHOS, BILITOT, PROT, ALBUMIN in the last 168 hours. No results for input(s): LIPASE, AMYLASE in the last 168 hours. No results for input(s): AMMONIA in the last 168 hours. CBC:  Recent Labs Lab 08/07/14 1714 08/07/14 2130  WBC 7.1 8.9  HGB 11.7* 11.5*  HCT 34.3* 34.0*  MCV 91.7 92.1  PLT 277 240   Cardiac Enzymes:  Recent Labs Lab 08/07/14 2130 08/08/14 0020 08/08/14 0219  TROPONINI <0.03 <0.03 <0.03   BNP: BNP (last 3 results) No results for input(s): BNP in the last 8760 hours.  ProBNP (last 3 results) No results for input(s): PROBNP in the last 8760 hours.  CBG:  Recent Labs Lab 08/07/14 2057 08/08/14 0726 08/08/14 1142  GLUCAP 122* 95 95       Signed:  Rhetta Mura  [facility] 08/08/2014, 1:17 PM

## 2014-08-09 LAB — HEMOGLOBIN A1C
Hgb A1c MFr Bld: 5.3 % (ref 4.8–5.6)
MEAN PLASMA GLUCOSE: 105 mg/dL

## 2014-08-11 LAB — FOLATE RBC
FOLATE, RBC: 1604 ng/mL (ref >498)
Folate, Hemolysate: 548.7 ng/mL
HEMATOCRIT: 34.2 % (ref 34.0–46.6)

## 2014-11-20 LAB — HEPATIC FUNCTION PANEL
ALK PHOS: 309 U/L — AB (ref 25–125)
ALT: 28 U/L (ref 7–35)
AST: 25 U/L (ref 13–35)

## 2014-11-20 LAB — BASIC METABOLIC PANEL
BUN: 25 mg/dL — AB (ref 4–21)
CREATININE: 0.8 mg/dL (ref 0.5–1.1)
Glucose: 103 mg/dL
POTASSIUM: 3.9 mmol/L (ref 3.4–5.3)
SODIUM: 140 mmol/L (ref 137–147)

## 2014-11-20 LAB — CBC AND DIFFERENTIAL
HCT: 26 % — AB (ref 36–46)
HEMOGLOBIN: 8.6 g/dL — AB (ref 12.0–16.0)
Platelets: 368 10*3/uL (ref 150–399)
WBC: 11.8 10*3/mL

## 2014-11-25 ENCOUNTER — Non-Acute Institutional Stay (SKILLED_NURSING_FACILITY): Payer: Medicare Other | Admitting: Adult Health

## 2014-11-25 ENCOUNTER — Encounter: Payer: Self-pay | Admitting: Adult Health

## 2014-11-25 DIAGNOSIS — K219 Gastro-esophageal reflux disease without esophagitis: Secondary | ICD-10-CM

## 2014-11-25 DIAGNOSIS — I10 Essential (primary) hypertension: Secondary | ICD-10-CM

## 2014-11-25 DIAGNOSIS — S82852S Displaced trimalleolar fracture of left lower leg, sequela: Secondary | ICD-10-CM

## 2014-11-25 DIAGNOSIS — J189 Pneumonia, unspecified organism: Secondary | ICD-10-CM | POA: Diagnosis not present

## 2014-11-25 DIAGNOSIS — E43 Unspecified severe protein-calorie malnutrition: Secondary | ICD-10-CM | POA: Diagnosis not present

## 2014-11-25 DIAGNOSIS — F329 Major depressive disorder, single episode, unspecified: Secondary | ICD-10-CM

## 2014-11-25 DIAGNOSIS — F1010 Alcohol abuse, uncomplicated: Secondary | ICD-10-CM

## 2014-11-25 DIAGNOSIS — D62 Acute posthemorrhagic anemia: Secondary | ICD-10-CM | POA: Diagnosis not present

## 2014-11-25 DIAGNOSIS — F32A Depression, unspecified: Secondary | ICD-10-CM

## 2014-11-26 ENCOUNTER — Other Ambulatory Visit: Payer: Self-pay

## 2014-11-26 ENCOUNTER — Non-Acute Institutional Stay (SKILLED_NURSING_FACILITY): Payer: Medicare Other | Admitting: Internal Medicine

## 2014-11-26 DIAGNOSIS — E46 Unspecified protein-calorie malnutrition: Secondary | ICD-10-CM | POA: Diagnosis not present

## 2014-11-26 DIAGNOSIS — F329 Major depressive disorder, single episode, unspecified: Secondary | ICD-10-CM | POA: Diagnosis not present

## 2014-11-26 DIAGNOSIS — I10 Essential (primary) hypertension: Secondary | ICD-10-CM | POA: Insufficient documentation

## 2014-11-26 DIAGNOSIS — D62 Acute posthemorrhagic anemia: Secondary | ICD-10-CM | POA: Diagnosis not present

## 2014-11-26 DIAGNOSIS — Z452 Encounter for adjustment and management of vascular access device: Secondary | ICD-10-CM

## 2014-11-26 DIAGNOSIS — D72829 Elevated white blood cell count, unspecified: Secondary | ICD-10-CM | POA: Diagnosis not present

## 2014-11-26 DIAGNOSIS — S82852S Displaced trimalleolar fracture of left lower leg, sequela: Secondary | ICD-10-CM

## 2014-11-26 DIAGNOSIS — J189 Pneumonia, unspecified organism: Secondary | ICD-10-CM | POA: Diagnosis not present

## 2014-11-26 DIAGNOSIS — R2681 Unsteadiness on feet: Secondary | ICD-10-CM | POA: Diagnosis not present

## 2014-11-26 DIAGNOSIS — K219 Gastro-esophageal reflux disease without esophagitis: Secondary | ICD-10-CM | POA: Diagnosis not present

## 2014-11-26 DIAGNOSIS — F32A Depression, unspecified: Secondary | ICD-10-CM

## 2014-11-26 DIAGNOSIS — S82853A Displaced trimalleolar fracture of unspecified lower leg, initial encounter for closed fracture: Secondary | ICD-10-CM | POA: Insufficient documentation

## 2014-11-26 HISTORY — DX: Pneumonia, unspecified organism: J18.9

## 2014-11-26 MED ORDER — LORAZEPAM 0.5 MG PO TABS: 0.5000 mg | ORAL_TABLET | Freq: Four times a day (QID) | ORAL | Status: DC | PRN

## 2014-11-26 NOTE — Telephone Encounter (Signed)
Rx faxed to [facility] @ [phone], phone number [phone]  

## 2014-11-26 NOTE — Progress Notes (Signed)
Patient ID: [name], female   DOB: 10-22-1945, 69 y.o.   MRN: [id number]    DATE:  11/25/14  MRN:  [id number]  BIRTHDAY: 1945-12-29  Facility:  Nursing Home Location:  [facility]  Nursing Home Room Number: 1004-2  LEVEL OF CARE:  SNF (31)  Contact Information    Name Relation Home Work Mobile   Szychowicz,Elizabeth Daughter [phone]        Chief Complaint  Patient presents with  . Hospitalization Follow-up    Left trimalleolar fracture S/P ORIF, HCAP, anemia, hypertension, depression, alcohol abuse and protein calorie malnutrition    HISTORY OF PRESENT ILLNESS:  This is a 69 year old female who was been admitted to [facility] on 11/24/14 from [facility]. She has PMH of hypertension, osteoporosis, tobacco and alcohol abuse. She had a fall in her front yard and sustained a comminuted fracture of the trimalleolar with displacement of the ankle joint. She had ORIF of left trimalleolar fracture dislocation of the ankle on 11/12/14. On postop day 6, she had hypoxic respiratory failure and was on BiPAP, failing BiPAP, she was intubated. She was extubated after 2 days and was able to transition to O2 [place]. There was concern for HCAP and she was put on linezolid.  She has been admitted for a short-term rehabilitation.  PAST MEDICAL HISTORY:  Past Medical History  Diagnosis Date  . Chicken pox   . Chest pain   . SOB (shortness of breath) on exertion   . Fall 06/15/2011    Caused discomfort in lower left rib and left lateral hip area  . H/O: hysterectomy 1985  . Depressive disorder   . Pre-syncope   . Dizziness   . OCD (obsessive compulsive disorder)   . Anxiety   . Opioid abuse   . Hypokalemia   . Severe sepsis (HCC)   . Anemia   . Azotemia   . Left trimalleolar fracture   . Respiratory failure (HCC)   . History of MRSA infection   . Leukocytosis      CURRENT MEDICATIONS: Reviewed  Patient's Medications  New Prescriptions   No  medications on file  Previous Medications   HEPARIN 5000 UNIT/ML INJECTION    Inject 5,000 Units into the skin every 8 (eight) hours.   IPRATROPIUM-ALBUTEROL (DUONEB) 0.5-2.5 (3) MG/3ML SOLN    Take 3 mLs by nebulization every 6 (six) hours as needed.   LORAZEPAM (ATIVAN) 0.5 MG TABLET    Take 0.5 mg by mouth every 6 (six) hours as needed for anxiety.   METOPROLOL TARTRATE (LOPRESSOR) 25 MG TABLET    Take 12.5 mg by mouth 2 (two) times daily.   MULTIPLE VITAMIN (MULTIVITAMIN) TABLET    Take 1 tablet by mouth daily.   ONDANSETRON HCL (ZOFRAN IJ)    Inject as directed every 6 (six) hours as needed.   PANTOPRAZOLE (PROTONIX) 40 MG TABLET    Take 40 mg by mouth daily. Before breakfast   POTASSIUM CHLORIDE ER 20 MEQ TBCR    Take 40 mEq by mouth daily.   SERTRALINE (ZOLOFT) 100 MG TABLET    Take 100 mg by mouth daily.   THIAMINE (VITAMIN B-1) 100 MG TABLET    Take 100 mg by mouth daily.  Modified Medications   No medications on file  Discontinued Medications   LINEZOLID (ZYVOX) 600 MG TABLET    Take 600 mg by mouth every 12 (twelve) hours.   LORAZEPAM (ATIVAN) 1 MG TABLET  Take 1 mg by mouth every 4 (four) hours as needed for anxiety.     Allergies  Allergen Reactions  . Sulfa Antibiotics     Hives      REVIEW OF SYSTEMS:  GENERAL: no change in appetite, no fatigue, no weight changes, no fever, chills or weakness EYES: Denies change in vision, dry eyes, eye pain, itching or discharge EARS: Denies change in hearing, ringing in ears, or earache NOSE: Denies nasal congestion or epistaxis MOUTH and THROAT: Denies oral discomfort, gingival pain or bleeding, pain from teeth or hoarseness   RESPIRATORY: no cough, SOB, DOE, wheezing, hemoptysis CARDIAC: no chest pain, edema or palpitations GI: no abdominal pain, diarrhea, constipation, heart burn, nausea or vomiting GU: Denies dysuria, frequency, hematuria, incontinence, or discharge PSYCHIATRIC: Denies feeling of depression or anxiety. No  report of hallucinations, insomnia, paranoia, or agitation   PHYSICAL EXAMINATION  GENERAL APPEARANCE: Well nourished. In no acute distress. Normal body habitus HEAD: Normal in size and contour. No evidence of trauma EYES: Lids open and close normally. No blepharitis, entropion or ectropion. PERRL. Conjunctivae are clear and sclerae are white. Lenses are without opacity EARS: Pinnae are normal. Patient hears normal voice tunes of the examiner MOUTH and THROAT: Lips are without lesions. Oral mucosa is moist and without lesions. Tongue is normal in shape, size, and color and without lesions NECK: supple, trachea midline, no neck masses, no thyroid tenderness, no thyromegaly, +IJ triple lumen cath LYMPHATICS: no LAN in the neck, no supraclavicular LAN RESPIRATORY: breathing is even & unlabored, BS CTAB CARDIAC: RRR, no murmur,no extra heart sounds, no edema GI: abdomen soft, normal BS, no masses, no tenderness, no hepatomegaly, no splenomegaly EXTREMITIES:  Left leg on splint wrapped with ACE wrap PSYCHIATRIC: Alert and oriented X 3. Affect and behavior are appropriate  LABS/RADIOLOGY: Labs reviewed: 11/25/14  WBC 8.5 sodium 139 potassium 3.9 glucose 122 BUN 8 creatinine 0.65 calcium 8.3 total protein 5.1 albumin 2.73 total bilirubin 0.34 alkaline phosphatase 157 SGOT 17 SGPT 19 Basic Metabolic Panel:  Recent Labs  82/42/3504/17/16 1205 05/18/14 1222 08/07/14 1714 08/07/14 2130 11/20/14  NA 131* 133* 130*  --  140  K 4.5 4.4 4.7  --  3.9  CL 98 98 97*  --   --   CO2 20  --  23  --   --   GLUCOSE 127* 128* 122*  --   --   BUN 13 17 14   --  25*  CREATININE 0.95 0.90 1.29* 1.14* 0.8  CALCIUM 8.7  --  8.8*  --   --    Liver Function Tests:  Recent Labs  11/20/14  AST 25  ALT 28  ALKPHOS 309*   CBC:  Recent Labs  05/18/14 1322 08/07/14 1714 08/07/14 2130 08/08/14 1125 11/20/14  WBC 8.0 7.1 8.9  --  11.8  NEUTROABS 6.8  --   --   --   --   HGB 11.2* 11.7* 11.5*  --  8.6*  HCT  33.2* 34.3* 34.0* 34.2 26*  MCV 90.5 91.7 92.1  --   --   PLT 224 277 240  --  368   Lipid Panel:  Recent Labs  08/07/14 2130  HDL 76   Cardiac Enzymes:  Recent Labs  08/07/14 2130 08/08/14 0020 08/08/14 0219  TROPONINI <0.03 <0.03 <0.03   CBG:  Recent Labs  08/07/14 2057 08/08/14 0726 08/08/14 1142  GLUCAP 122* 95 95      ASSESSMENT/PLAN:  Left trimalleolar fracture with  displacement of the ankle joint S/P ORIF - for rehabilitation; physiatry consultation ordered and was started on oxycodone 5 mg 1-2 tabs by mouth every 4 hours when necessary and Tylenol 500 mg 1 tab by mouth every 6 hours when necessary for pain; heparin 5000 units subcutaneous every 8 hours for DVT prophylaxis  HCAP - no fever nor SOB was noted; WBC 8.5; she was on linezolid 10 days in the hospital so linezolid will be discontinued; start Robitussin-DM 100 mg/5 mL keep 10 mL = 200 mg by mouth every 4 hours when necessary; DuoNeb 0.5-2.5 mg/3 mL via neb every 6 hours when necessary  Hypertension - continue metoprolol tartrate 12.5 mg by mouth twice a day  Depression - son reported that patient has been on Seroquel and citalopram for 6 months already and has no untoward side effect; mood is stable  Anemia, acute blood loss - S/P transfusion of 1 unit packed RBC; hemoglobin 7.4 became 8.3 after distress fusion  Alcohol abuse - start Ativan 0.5 mg 1 tab by mouth every 6 hours when necessary; check BMP in 1 week  Protein calorie malnutrition 6, severe - albumin 2.73; RD consultation; start Procel 2 scoops by mouth twice a day  GERD - continue Protonix 40 mg by mouth daily    Goals of care:  Short-term rehabilitation    [facility], NP [facility] [phone]

## 2014-11-26 NOTE — Progress Notes (Signed)
Patient ID: [name], female   DOB: November 13, 1945, 69 y.o.   MRN: [id number]       [facility] PCP: Theodora Blow, FNP  Code Status: Full Code  Allergies  Allergen Reactions  . Sulfa Antibiotics     Hives     Chief Complaint  Patient presents with  . New Admit To SNF    New Admission     HPI:  69 y.o. patient is here for short term rehabilitation post hospital admission from 11/11/14-11/24/14 post fall with left comminuted fracture of trimalleolar area with displacement of ankle joint. She underwent ORIF. Post operatively, she had acute respiratory failure and required intubation and was treated for HCAP and possible ARDS. She is now on o2 by nasal canula and has completed her course of linezolid for MRSA in the nares. She has central line left behind on time of arrival to the facility and has it at present. She has been afebrile. Her blood pressure has been stable.    Review of Systems:  Constitutional: Negative for fever, diaphoresis. has cold intolerance HENT: Negative for headache, congestion, nasal discharge, left neck central line in place Eyes: Negative for eye pain, blurred vision, double vision and discharge.  Respiratory: Negative for shortness of breath at rest. On o2 by nasal canula and has cough without phlegm.  Cardiovascular: Negative for chest pain, palpitations, leg swelling.  Gastrointestinal: Negative for heartburn, nausea, vomiting, abdominal pain.  Genitourinary: Negative for dysuria, flank pain.  Musculoskeletal: Negative for back pain, falls. Skin: Negative for itching, rash.  Neurological: positive for generalized weakness and has positional dizziness. Negative for tingling, focal weakness Psychiatric/Behavioral: Negative for depression, memory loss.    Past Medical History  Diagnosis Date  . Chicken pox   . Chest pain   . SOB (shortness of breath) on exertion   . Fall 06/15/2011    Caused discomfort in lower left rib and left  lateral hip area  . H/O: hysterectomy 1985  . Depressive disorder   . Pre-syncope   . Dizziness   . OCD (obsessive compulsive disorder)   . Anxiety   . Opioid abuse   . Hypokalemia   . Severe sepsis (HCC)   . Anemia   . Azotemia   . Left trimalleolar fracture   . Respiratory failure (HCC)   . History of MRSA infection   . Leukocytosis    Past Surgical History  Procedure Laterality Date  . Gallbladder surgery    . Appendectomy    . Total abdominal hysterectomy     Social History:   reports that she has never smoked. She does not have any smokeless tobacco history on file. She reports that she does not drink alcohol or use illicit drugs.  Family History  Problem Relation Age of Onset  . Heart attack Mother   . Colon cancer Father   . Diabetes Brother   . Heart attack Brother 20    MI    Medications:   Medication List       This list is accurate as of: 11/26/14  1:01 PM.  Always use your most recent med list.               acetaminophen 500 MG tablet  Commonly known as:  TYLENOL  Take one tablet by mouth every 6 hours as needed for mild pain     guaiFENesin-dextromethorphan 100-10 MG/5ML syrup  Commonly known as:  ROBITUSSIN DM  10ml by mouth every 4  hours as needed for cough     heparin 5000 UNIT/ML injection  Inject 5,000 Units into the skin every 8 (eight) hours.     ipratropium-albuterol 0.5-2.5 (3) MG/3ML Soln  Commonly known as:  DUONEB  Take 3 mLs by nebulization every 6 (six) hours as needed.     LORazepam 0.5 MG tablet  Commonly known as:  ATIVAN  Take 1 tablet (0.5 mg total) by mouth every 6 (six) hours as needed for anxiety.     metoprolol tartrate 25 MG tablet  Commonly known as:  LOPRESSOR  Take 12.5 mg by mouth 2 (two) times daily.     oxyCODONE 5 MG immediate release tablet  Commonly known as:  Oxy IR/ROXICODONE  Take one to two tablets by mouth every 4 hours as needed for moderate to severe pain     pantoprazole 40 MG tablet    Commonly known as:  PROTONIX  Take 40 mg by mouth daily. Before breakfast     PROCEL PO  Two scoops by mouth twice daily     sertraline 100 MG tablet  Commonly known as:  ZOLOFT  Take 100 mg by mouth daily.         Physical Exam: Filed Vitals:   70/26/16 1212  BP: 136/60  Pulse: 108  Temp: 97.8 F (36.6 C)  TempSrc: Oral  Resp: 16  Height: 5\' 2"  (1.575 m)  Weight: 124 lb (56.246 kg)  SpO2: 96%    General- elderly female, thin built, frail, in no acute distress Head- normocephalic, atraumatic Nose- normal nasal mucosa, no maxillary or frontal sinus tenderness, no nasal discharge Throat- moist mucus membrane Eyes- PERRLA, EOMI, no pallor, no icterus, no discharge, normal conjunctiva, normal sclera Neck- no cervical lymphadenopathy, no thyromegaly, no jugular vein distension, has left IJ central line Cardiovascular- normal s1,s2, no murmurs, palpable dorsalis pedis and radial pulses, no leg edema Respiratory- bilateral clear to auscultation, no wheeze, no rhonchi, no crackles, no use of accessory muscles, on o2 Abdomen- bowel sounds present, soft, non tender Musculoskeletal- NWB to LLE, able to move all other 3 extremities, on wheelchair Neurological- no focal deficit, alert and oriented to person, place and time Skin- warm and dry, left leg in cast, able to move her toes and has good capillary refill Psychiatry- normal mood and affect    Labs reviewed: Basic Metabolic Panel:  Recent Labs  69/62/9504/17/16 1205 05/18/14 1222 08/07/14 1714 08/07/14 2130 11/20/14  NA 131* 133* 130*  --  140  K 4.5 4.4 4.7  --  3.9  CL 98 98 97*  --   --   CO2 20  --  23  --   --   GLUCOSE 127* 128* 122*  --   --   BUN 13 17 14   --  25*  CREATININE 0.95 0.90 1.29* 1.14* 0.8  CALCIUM 8.7  --  8.8*  --   --    Liver Function Tests:  Recent Labs  11/20/14  AST 25  ALT 28  ALKPHOS 309*   CBC:  Recent Labs  05/18/14 1322 08/07/14 1714 08/07/14 2130 08/08/14 1125 11/20/14   WBC 8.0 7.1 8.9  --  11.8  NEUTROABS 6.8  --   --   --   --   HGB 11.2* 11.7* 11.5*  --  8.6*  HCT 33.2* 34.3* 34.0* 34.2 26*  MCV 90.5 91.7 92.1  --   --   PLT 224 277 240  --  368   Cardiac  Enzymes:  Recent Labs  08/07/14 2130 08/08/14 0020 08/08/14 0219  TROPONINI <0.03 <0.03 <0.03   BNP: Invalid input(s): POCBNP CBG:  Recent Labs  08/07/14 2057 08/08/14 0726 08/08/14 1142  GLUCAP 122* 95 95    Radiological Exams: Dg Chest Port 1 View  08/07/2014  CLINICAL DATA:  Shortness of breath and chest pain, left chest EXAM: PORTABLE CHEST - 1 VIEW COMPARISON:  July 23, 2013 FINDINGS: Lungs are borderline hyperexpanded. There is mild atelectasis in the left base. Lungs elsewhere clear. Heart size and pulmonary vascularity are normal. No adenopathy. No bone lesions. IMPRESSION: Lungs borderline hyperexpanded. Mild atelectasis left base. Lungs otherwise clear. Electronically Signed   By: Bretta Bang III M.D.   On: 08/07/2014 17:26    Assessment/Plan  Unsteady gait With recent left ankle fracture. Will have patient work with PT/OT as tolerated to regain strength and restore function.  Fall precautions are in place.  Left ankle fracture S/p ORIF. NWB LLE. Continue heprain sq for dvt prophylaxis and tylenol for mild and oxycodone 5 mg q4h prn for moderate to severe pain. Will have her work with physical therapy and occupational therapy team to help with gait training and muscle strengthening exercises.fall precautions. Skin care. Encourage to be out of bed. To work with physiatry  Leukocytosis Likely from her HCAP. Afebrile at present. Monitor wbc and temp curve  Blood loss anemia S/p 1 u prbc transfusion in the hospital. Monitor cbc  Left neck central line Removed it under aseptic precautions and pressure dressing applied.   HCAP Advised to use incentive spirometer. Completed zyvox. Continue prn robitussin and duoneb with o2, wean o2 as tolerated to keep o2 sat >  90%  gerd Stable, continue pantoprazole 40 mg daily  Protein calorie malnutrition Continue procel supplement, dietary follow up, monitor weight and po intake  HTN Stable, continue lopressor 12.5 mg bid and monitor bp daily for now  Chronic depression  Continue zoloft 100 mg daily for now and monitor   Goals of care: short term rehabilitation   Labs/tests ordered: cbc with diff, cmp 12/01/14  Family/ staff Communication: reviewed care plan with patient and nursing supervisor    Oneal Grout, MD  [facility] [phone] (Monday-Friday 8 am - 5 pm) [phone] (afterhours)

## 2014-12-12 LAB — BASIC METABOLIC PANEL
BUN: 9 mg/dL (ref 4–21)
Creatinine: 1 mg/dL (ref 0.5–1.1)
GLUCOSE: 111 mg/dL
Potassium: 4.9 mmol/L (ref 3.4–5.3)
SODIUM: 137 mmol/L (ref 137–147)

## 2014-12-12 LAB — CBC AND DIFFERENTIAL
HEMATOCRIT: 31 % — AB (ref 36–46)
HEMOGLOBIN: 9.9 g/dL — AB (ref 12.0–16.0)
PLATELETS: 434 10*3/uL — AB (ref 150–399)
WBC: 6.2 10^3/mL

## 2014-12-16 ENCOUNTER — Ambulatory Visit (INDEPENDENT_AMBULATORY_CARE_PROVIDER_SITE_OTHER): Payer: Medicare Other | Admitting: Cardiovascular Disease

## 2014-12-16 ENCOUNTER — Encounter: Payer: Self-pay | Admitting: Cardiovascular Disease

## 2014-12-16 VITALS — BP 110/70 | HR 111 | Ht 62.0 in

## 2014-12-16 DIAGNOSIS — R9431 Abnormal electrocardiogram [ECG] [EKG]: Secondary | ICD-10-CM | POA: Diagnosis not present

## 2014-12-16 DIAGNOSIS — R000 Tachycardia, unspecified: Secondary | ICD-10-CM | POA: Insufficient documentation

## 2014-12-16 DIAGNOSIS — I517 Cardiomegaly: Secondary | ICD-10-CM | POA: Diagnosis not present

## 2014-12-16 DIAGNOSIS — I10 Essential (primary) hypertension: Secondary | ICD-10-CM

## 2014-12-16 HISTORY — DX: Cardiomegaly: I51.7

## 2014-12-16 NOTE — Patient Instructions (Signed)
Medication Instructions:  INCREASE Metoprolol to 25 mg twice daily   Labwork: None Ordered   Testing/Procedures: None Ordered   Follow-Up: Your physician recommends that you schedule a follow-up appointment in: as needed with Dr. Elease HashimotoNahser.    If you need a refill on your cardiac medications before your next appointment, please call your pharmacy.   Thank you for choosing [facility]! Eligha BridegroomMichelle Hasnain Manheim, RN [phone]

## 2014-12-16 NOTE — Progress Notes (Signed)
Cardiology Office Note   Date:  12/16/2014   ID:  [name], DOB 01/24/1946, MRN [id number]  [facility]  [facility]  Cardiologist:   Chizara Mena, Deloris PingPhilip J, MD   Chief Complaint  Patient presents with  . Abnormal ECG   Problem list 1. History of ankle fracture 2. Sepsis syndrome 3.HCAP and ARDS     History of Present Illness: [name] is a 69 y.o. female who presents for evaluation of an abnormal ECG Was seen with Marisue IvanLiz ( daughter )  [place] the [place]  With a left ankle fracture. Just after surgery , she had respiratory failure, intubation, ARDS.  On the vent for 2 days  Has gone to rehab Reported some heart racing last week it felt like her bike her heart might jump out of her chest. She had an EKG which revealed sinus tachycardia and some right ventricular hypertrophy.     Past Medical History  Diagnosis Date  . Chicken pox   . Chest pain   . SOB (shortness of breath) on exertion   . Fall 06/15/2011    Caused discomfort in lower left rib and left lateral hip area  . H/O: hysterectomy 1985  . Depressive disorder   . Pre-syncope   . Dizziness   . OCD (obsessive compulsive disorder)   . Anxiety   . Opioid abuse   . Hypokalemia   . Severe sepsis (HCC)   . Anemia   . Azotemia   . Left trimalleolar fracture   . Respiratory failure (HCC)   . History of MRSA infection   . Leukocytosis     Past Surgical History  Procedure Laterality Date  . Gallbladder surgery    . Appendectomy    . Total abdominal hysterectomy       Current Outpatient Prescriptions  Medication Sig Dispense Refill  . acetaminophen (TYLENOL) 500 MG tablet Take one tablet by mouth every 6 hours as needed for mild pain    . guaiFENesin-dextromethorphan (ROBITUSSIN DM) 100-10 MG/5ML syrup 10ml by mouth every 4 hours as needed for cough    . heparin 5000 UNIT/ML injection Inject 5,000 Units into the skin every 8 (eight) hours.    Marland Kitchen.  ipratropium-albuterol (DUONEB) 0.5-2.5 (3) MG/3ML SOLN Take 3 mLs by nebulization every 6 (six) hours as needed.     Marland Kitchen. LORazepam (ATIVAN) 0.5 MG tablet Take 1 tablet (0.5 mg total) by mouth every 6 (six) hours as needed for anxiety. 120 tablet 5  . metoprolol tartrate (LOPRESSOR) 25 MG tablet Take 12.5 mg by mouth 2 (two) times daily.    Marland Kitchen. oxyCODONE (OXY IR/ROXICODONE) 5 MG immediate release tablet Take one to two tablets by mouth every 4 hours as needed for moderate to severe pain    . pantoprazole (PROTONIX) 40 MG tablet Take 40 mg by mouth daily. Before breakfast    . Protein (PROCEL PO) Two scoops by mouth twice daily    . sertraline (ZOLOFT) 100 MG tablet Take 100 mg by mouth daily.     No current facility-administered medications for this visit.    Allergies:   Sulfa antibiotics    Social History:  The patient  reports that she has never smoked. She does not have any smokeless tobacco history on file. She reports that she does not drink alcohol or use illicit drugs.   Family History:  The patient's family history includes Colon cancer in her father; Diabetes in her brother;  Heart attack in her mother; Heart attack (age of onset: 70) in her brother.    ROS:  Please see the history of present illness.    Review of Systems: Constitutional:  denies fever, chills, diaphoresis, appetite change and fatigue.  HEENT: denies photophobia, eye pain, redness, hearing loss, ear pain, congestion, sore throat, rhinorrhea, sneezing, neck pain, neck stiffness and tinnitus.  Respiratory: denies SOB, DOE, cough, chest tightness, and wheezing.  Cardiovascular: admits to   palpitations    Gastrointestinal: denies nausea, vomiting, abdominal pain, diarrhea, constipation, blood in stool.  Genitourinary: denies dysuria, urgency, frequency, hematuria, flank pain and difficulty urinating.  Musculoskeletal: denies  myalgias, back pain, joint swelling, arthralgias and gait problem.   Skin: denies pallor, rash  and wound.  Neurological: denies dizziness, seizures, syncope, weakness, light-headedness, numbness and headaches.   Hematological: denies adenopathy, easy bruising, personal or family bleeding history.  Psychiatric/ Behavioral: denies suicidal ideation, mood changes, confusion, nervousness, sleep disturbance and agitation.       All other systems are reviewed and negative.    PHYSICAL EXAM: VS:  BP 110/70 mmHg  Pulse 111  Ht  (1.575 m)  Wt  , BMI There is no weight on file to calculate BMI. GEN: Well nourished, well developed, in no acute distress HEENT: normal Neck: no JVD, carotid bruits, or masses Cardiac: RRR;  Tachycardia ,  no murmurs, rubs, or gallops,no edema  Respiratory:  clear to auscultation bilaterally, normal work of breathing GI: soft, nontender, nondistended, + BS MS: left leg is a cast.  Skin: warm and dry, no rash Neuro:  Strength and sensation are intact Psych: normal  EKG:  EKG is ordered today. The ekg ordered today demonstrates sinus tachycardia at 111. Right ventricular hypertrophy. Right ventricular hypertrophy has been seen on previous EKGs   Recent Labs: 11/20/2014: ALT 28; BUN 25*; Creatinine 0.8; Hemoglobin 8.6*; Platelets 368; Potassium 3.9; Sodium 140    Lipid Panel    Component Value Date/Time   CHOL 152 08/07/2014 2130   TRIG 36 08/07/2014 2130   HDL 76 08/07/2014 2130   CHOLHDL 2.0 08/07/2014 2130   VLDL 7 08/07/2014 2130   LDLCALC 69 08/07/2014 2130      Wt Readings from Last 3 Encounters:  11/26/14 124 lb (56.246 kg)  11/25/14 124 lb (56.246 kg)  08/08/14 111 lb (50.349 kg)      Other studies Reviewed: Additional studies/ records that were reviewed today include: . Review of the above records demonstrates:    ASSESSMENT AND PLAN:  1.  Right ventricular hypertrophy: This is been seen on previous EKGs. This appears to be stable  2. Sinus tachycardia: We will increase her metoprolol to 25 mg twice a day. Her medical  doctor can continue to titrate up as needed. I will see her on an  as-needed basis.   Current medicines are reviewed at length with the patient today.  The patient does not have concerns regarding medicines.  The following changes have been made:  no change  Labs/ tests ordered today include:  No orders of the defined types were placed in this encounter.     Disposition:   FU with me as needed     Yecheskel Kurek, Deloris Ping, MD  12/16/2014 10:17 AM    [facility] [address] [place], [place], [place]  [address] Phone: [phone]; Fax: [phone]   [facility]  [address] [place], [place]  [address] [phone]  Fax [phone]

## 2014-12-25 ENCOUNTER — Encounter (HOSPITAL_COMMUNITY): Payer: Self-pay | Admitting: *Deleted

## 2014-12-25 ENCOUNTER — Emergency Department (HOSPITAL_COMMUNITY): Payer: Medicare Other

## 2014-12-25 ENCOUNTER — Inpatient Hospital Stay (HOSPITAL_COMMUNITY): Payer: Medicare Other

## 2014-12-25 ENCOUNTER — Inpatient Hospital Stay (HOSPITAL_COMMUNITY): Payer: Medicare Other | Admitting: Anesthesiology

## 2014-12-25 ENCOUNTER — Inpatient Hospital Stay (HOSPITAL_COMMUNITY)
Admission: EM | Admit: 2014-12-25 | Discharge: 2014-12-28 | DRG: 481 | Disposition: A | Discharge status: Skilled Nursing Facility | Payer: Medicare Other | Attending: Internal Medicine | Admitting: Internal Medicine

## 2014-12-25 ENCOUNTER — Encounter (HOSPITAL_COMMUNITY)
Admission: EM | Disposition: A | Discharge status: Skilled Nursing Facility | Payer: Self-pay | Source: Home / Self Care | Attending: Internal Medicine

## 2014-12-25 DIAGNOSIS — Z90710 Acquired absence of both cervix and uterus: Secondary | ICD-10-CM

## 2014-12-25 DIAGNOSIS — R52 Pain, unspecified: Secondary | ICD-10-CM

## 2014-12-25 DIAGNOSIS — Z882 Allergy status to sulfonamides status: Secondary | ICD-10-CM

## 2014-12-25 DIAGNOSIS — F329 Major depressive disorder, single episode, unspecified: Secondary | ICD-10-CM | POA: Diagnosis not present

## 2014-12-25 DIAGNOSIS — S72142A Displaced intertrochanteric fracture of left femur, initial encounter for closed fracture: Secondary | ICD-10-CM | POA: Diagnosis not present

## 2014-12-25 DIAGNOSIS — R938 Abnormal findings on diagnostic imaging of other specified body structures: Secondary | ICD-10-CM | POA: Diagnosis not present

## 2014-12-25 DIAGNOSIS — F32A Depression, unspecified: Secondary | ICD-10-CM | POA: Diagnosis present

## 2014-12-25 DIAGNOSIS — S82853A Displaced trimalleolar fracture of unspecified lower leg, initial encounter for closed fracture: Secondary | ICD-10-CM | POA: Diagnosis present

## 2014-12-25 DIAGNOSIS — I5032 Chronic diastolic (congestive) heart failure: Secondary | ICD-10-CM | POA: Diagnosis present

## 2014-12-25 DIAGNOSIS — N179 Acute kidney failure, unspecified: Secondary | ICD-10-CM | POA: Diagnosis present

## 2014-12-25 DIAGNOSIS — T148XXA Other injury of unspecified body region, initial encounter: Secondary | ICD-10-CM

## 2014-12-25 DIAGNOSIS — D62 Acute posthemorrhagic anemia: Secondary | ICD-10-CM | POA: Diagnosis not present

## 2014-12-25 DIAGNOSIS — R9389 Abnormal findings on diagnostic imaging of other specified body structures: Secondary | ICD-10-CM

## 2014-12-25 DIAGNOSIS — W1830XA Fall on same level, unspecified, initial encounter: Secondary | ICD-10-CM | POA: Diagnosis present

## 2014-12-25 DIAGNOSIS — Z8701 Personal history of pneumonia (recurrent): Secondary | ICD-10-CM

## 2014-12-25 DIAGNOSIS — Z79899 Other long term (current) drug therapy: Secondary | ICD-10-CM

## 2014-12-25 DIAGNOSIS — I10 Essential (primary) hypertension: Secondary | ICD-10-CM | POA: Diagnosis present

## 2014-12-25 DIAGNOSIS — S82852D Displaced trimalleolar fracture of left lower leg, subsequent encounter for closed fracture with routine healing: Secondary | ICD-10-CM

## 2014-12-25 DIAGNOSIS — Y92000 Kitchen of unspecified non-institutional (private) residence as  the place of occurrence of the external cause: Secondary | ICD-10-CM

## 2014-12-25 DIAGNOSIS — S72002A Fracture of unspecified part of neck of left femur, initial encounter for closed fracture: Secondary | ICD-10-CM | POA: Diagnosis present

## 2014-12-25 DIAGNOSIS — F419 Anxiety disorder, unspecified: Secondary | ICD-10-CM | POA: Diagnosis not present

## 2014-12-25 HISTORY — PX: INTRAMEDULLARY (IM) NAIL INTERTROCHANTERIC: SHX5875

## 2014-12-25 HISTORY — DX: Displaced intertrochanteric fracture of left femur, initial encounter for closed fracture: S72.142A

## 2014-12-25 LAB — COMPREHENSIVE METABOLIC PANEL
ALT: 23 U/L (ref 14–54)
AST: 28 U/L (ref 15–41)
Albumin: 3.5 g/dL (ref 3.5–5.0)
Alkaline Phosphatase: 120 U/L (ref 38–126)
Anion gap: 12 (ref 5–15)
BILIRUBIN TOTAL: 0.8 mg/dL (ref 0.3–1.2)
BUN: 16 mg/dL (ref 6–20)
CALCIUM: 9.7 mg/dL (ref 8.9–10.3)
CO2: 25 mmol/L (ref 22–32)
CREATININE: 1.27 mg/dL — AB (ref 0.44–1.00)
Chloride: 97 mmol/L — ABNORMAL LOW (ref 101–111)
GFR calc Af Amer: 48 mL/min — ABNORMAL LOW (ref >60)
GFR, EST NON AFRICAN AMERICAN: 42 mL/min — AB (ref >60)
Glucose, Bld: 141 mg/dL — ABNORMAL HIGH (ref 65–99)
POTASSIUM: 5.1 mmol/L (ref 3.5–5.1)
Sodium: 134 mmol/L — ABNORMAL LOW (ref 135–145)
TOTAL PROTEIN: 7.1 g/dL (ref 6.5–8.1)

## 2014-12-25 LAB — CBC WITH DIFFERENTIAL/PLATELET
BASOS ABS: 0 10*3/uL (ref 0.0–0.1)
BASOS PCT: 0 %
EOS ABS: 0 10*3/uL (ref 0.0–0.7)
EOS PCT: 0 %
HCT: 35 % — ABNORMAL LOW (ref 36.0–46.0)
Hemoglobin: 11.6 g/dL — ABNORMAL LOW (ref 12.0–15.0)
LYMPHS PCT: 9 %
Lymphs Abs: 1.1 10*3/uL (ref 0.7–4.0)
MCH: 29.8 pg (ref 26.0–34.0)
MCHC: 33.1 g/dL (ref 30.0–36.0)
MCV: 90 fL (ref 78.0–100.0)
Monocytes Absolute: 0.6 10*3/uL (ref 0.1–1.0)
Monocytes Relative: 5 %
Neutro Abs: 10.6 10*3/uL — ABNORMAL HIGH (ref 1.7–7.7)
Neutrophils Relative %: 86 %
PLATELETS: 361 10*3/uL (ref 150–400)
RBC: 3.89 MIL/uL (ref 3.87–5.11)
RDW: 15.1 % (ref 11.5–15.5)
WBC: 12.3 10*3/uL — AB (ref 4.0–10.5)

## 2014-12-25 LAB — URINALYSIS, ROUTINE W REFLEX MICROSCOPIC
Bilirubin Urine: NEGATIVE
GLUCOSE, UA: NEGATIVE mg/dL
Hgb urine dipstick: NEGATIVE
Ketones, ur: NEGATIVE mg/dL
LEUKOCYTES UA: NEGATIVE
NITRITE: NEGATIVE
PH: 5.5 (ref 5.0–8.0)
Protein, ur: NEGATIVE mg/dL
SPECIFIC GRAVITY, URINE: 1.02 (ref 1.005–1.030)

## 2014-12-25 LAB — TYPE AND SCREEN
ABO/RH(D): A POS
Antibody Screen: NEGATIVE

## 2014-12-25 LAB — PROTIME-INR
INR: 1.1 (ref 0.00–1.49)
PROTHROMBIN TIME: 14.4 s (ref 11.6–15.2)

## 2014-12-25 LAB — APTT: aPTT: 30 s (ref 24–37)

## 2014-12-25 LAB — ABO/RH: ABO/RH(D): A POS

## 2014-12-25 SURGERY — FIXATION, FRACTURE, INTERTROCHANTERIC, WITH INTRAMEDULLARY ROD
Anesthesia: General | Site: Hip | Laterality: Left

## 2014-12-25 MED ORDER — ONDANSETRON HCL 4 MG PO TABS
4.0000 mg | ORAL_TABLET | Freq: Four times a day (QID) | ORAL | Status: DC | PRN
  Administered 2014-12-26 (×2): 4 mg via ORAL
  Filled 2014-12-25 (×2): qty 1

## 2014-12-25 MED ORDER — LACTATED RINGERS IV SOLN
INTRAVENOUS | Status: DC | PRN
  Administered 2014-12-25: 17:00:00 via INTRAVENOUS

## 2014-12-25 MED ORDER — NEOSTIGMINE METHYLSULFATE 10 MG/10ML IV SOLN
INTRAVENOUS | Status: DC | PRN
  Administered 2014-12-25: 3 mg via INTRAVENOUS

## 2014-12-25 MED ORDER — SUGAMMADEX SODIUM 200 MG/2ML IV SOLN
INTRAVENOUS | Status: DC | PRN
  Administered 2014-12-25: 100 mg via INTRAVENOUS

## 2014-12-25 MED ORDER — SODIUM CHLORIDE 0.9 % IV SOLN
INTRAVENOUS | Status: AC
  Administered 2014-12-26: 06:00:00 via INTRAVENOUS

## 2014-12-25 MED ORDER — PROPOFOL 10 MG/ML IV BOLUS
INTRAVENOUS | Status: AC
  Filled 2014-12-25: qty 20

## 2014-12-25 MED ORDER — HYDROCODONE-ACETAMINOPHEN 5-325 MG PO TABS
1.0000 | ORAL_TABLET | Freq: Four times a day (QID) | ORAL | Status: DC | PRN
Start: 2014-12-25 — End: 2014-12-26
  Administered 2014-12-25 – 2014-12-26 (×3): 1 via ORAL
  Filled 2014-12-25 (×3): qty 1

## 2014-12-25 MED ORDER — 0.9 % SODIUM CHLORIDE (POUR BTL) OPTIME
TOPICAL | Status: DC | PRN
  Administered 2014-12-25: 1000 mL

## 2014-12-25 MED ORDER — DOCUSATE SODIUM 100 MG PO CAPS
100.0000 mg | ORAL_CAPSULE | Freq: Two times a day (BID) | ORAL | Status: DC
  Administered 2014-12-26 (×2): 100 mg via ORAL
  Filled 2014-12-25 (×5): qty 1

## 2014-12-25 MED ORDER — MAGNESIUM HYDROXIDE 400 MG/5ML PO SUSP: 30.0000 mL | Freq: Every day | ORAL | Status: DC | PRN

## 2014-12-25 MED ORDER — FENTANYL CITRATE (PF) 100 MCG/2ML IJ SOLN
25.0000 ug | INTRAMUSCULAR | Status: DC | PRN
  Administered 2014-12-25 (×2): 25 ug via INTRAVENOUS

## 2014-12-25 MED ORDER — FENTANYL CITRATE (PF) 100 MCG/2ML IJ SOLN
INTRAMUSCULAR | Status: DC | PRN
  Administered 2014-12-25 (×3): 25 ug via INTRAVENOUS
  Administered 2014-12-25: 50 ug via INTRAVENOUS
  Administered 2014-12-25: 25 ug via INTRAVENOUS
  Administered 2014-12-25: 100 ug via INTRAVENOUS

## 2014-12-25 MED ORDER — PHENOL 1.4 % MT LIQD: 1.0000 | OROMUCOSAL | Status: DC | PRN

## 2014-12-25 MED ORDER — CEFAZOLIN SODIUM-DEXTROSE 2-3 GM-% IV SOLR
INTRAVENOUS | Status: DC | PRN
  Administered 2014-12-25: 2 g via INTRAVENOUS

## 2014-12-25 MED ORDER — FENTANYL CITRATE (PF) 250 MCG/5ML IJ SOLN
INTRAMUSCULAR | Status: AC
  Filled 2014-12-25: qty 5

## 2014-12-25 MED ORDER — ENOXAPARIN SODIUM 40 MG/0.4ML ~~LOC~~ SOLN
40.0000 mg | SUBCUTANEOUS | Status: DC
  Administered 2014-12-26 – 2014-12-28 (×3): 40 mg via SUBCUTANEOUS
  Filled 2014-12-25 (×3): qty 0.4

## 2014-12-25 MED ORDER — LIDOCAINE HCL (CARDIAC) 20 MG/ML IV SOLN
INTRAVENOUS | Status: DC | PRN
  Administered 2014-12-25: 60 mg via INTRAVENOUS

## 2014-12-25 MED ORDER — LORAZEPAM 2 MG/ML IJ SOLN: 0.5000 mg | Freq: Two times a day (BID) | INTRAMUSCULAR | Status: DC | PRN

## 2014-12-25 MED ORDER — FENTANYL CITRATE (PF) 100 MCG/2ML IJ SOLN
INTRAMUSCULAR | Status: AC
  Filled 2014-12-25: qty 2

## 2014-12-25 MED ORDER — ROCURONIUM BROMIDE 100 MG/10ML IV SOLN
INTRAVENOUS | Status: DC | PRN
  Administered 2014-12-25: 50 mg via INTRAVENOUS

## 2014-12-25 MED ORDER — ONDANSETRON HCL 4 MG PO TABS: 4.0000 mg | ORAL_TABLET | Freq: Four times a day (QID) | ORAL | Status: DC | PRN

## 2014-12-25 MED ORDER — OXYCODONE-ACETAMINOPHEN 5-325 MG PO TABS
1.0000 | ORAL_TABLET | ORAL | Status: DC | PRN
  Administered 2014-12-25: 2 via ORAL
  Filled 2014-12-25: qty 2

## 2014-12-25 MED ORDER — MORPHINE SULFATE (PF) 2 MG/ML IV SOLN
1.0000 mg | Freq: Once | INTRAVENOUS | Status: AC
  Administered 2014-12-25: 1 mg via INTRAVENOUS
  Filled 2014-12-25: qty 1

## 2014-12-25 MED ORDER — ONDANSETRON HCL 4 MG/2ML IJ SOLN
4.0000 mg | Freq: Once | INTRAMUSCULAR | Status: DC | PRN
Start: 2014-12-25 — End: 2014-12-25

## 2014-12-25 MED ORDER — ACETAMINOPHEN 650 MG RE SUPP: 650.0000 mg | Freq: Four times a day (QID) | RECTAL | Status: DC | PRN

## 2014-12-25 MED ORDER — ONDANSETRON HCL 4 MG/2ML IJ SOLN
4.0000 mg | Freq: Four times a day (QID) | INTRAMUSCULAR | Status: DC | PRN
  Administered 2014-12-27 – 2014-12-28 (×2): 4 mg via INTRAVENOUS
  Filled 2014-12-25 (×2): qty 2

## 2014-12-25 MED ORDER — PHENYLEPHRINE HCL 10 MG/ML IJ SOLN
INTRAMUSCULAR | Status: DC | PRN
  Administered 2014-12-25 (×5): 80 ug via INTRAVENOUS

## 2014-12-25 MED ORDER — ONDANSETRON HCL 4 MG/2ML IJ SOLN
INTRAMUSCULAR | Status: DC | PRN
  Administered 2014-12-25: 4 mg via INTRAVENOUS

## 2014-12-25 MED ORDER — PROPOFOL 10 MG/ML IV BOLUS
INTRAVENOUS | Status: DC | PRN
  Administered 2014-12-25: 100 mg via INTRAVENOUS

## 2014-12-25 MED ORDER — IPRATROPIUM-ALBUTEROL 0.5-2.5 (3) MG/3ML IN SOLN: 3.0000 mL | RESPIRATORY_TRACT | Status: DC | PRN

## 2014-12-25 MED ORDER — BISACODYL 10 MG RE SUPP: 10.0000 mg | Freq: Every day | RECTAL | Status: DC | PRN

## 2014-12-25 MED ORDER — SENNA 8.6 MG PO TABS
1.0000 | ORAL_TABLET | Freq: Two times a day (BID) | ORAL | Status: DC
  Administered 2014-12-26 (×2): 8.6 mg via ORAL
  Filled 2014-12-25 (×4): qty 1

## 2014-12-25 MED ORDER — SUGAMMADEX SODIUM 200 MG/2ML IV SOLN
INTRAVENOUS | Status: AC
  Filled 2014-12-25: qty 2

## 2014-12-25 MED ORDER — MENTHOL 3 MG MT LOZG: 1.0000 | LOZENGE | OROMUCOSAL | Status: DC | PRN

## 2014-12-25 MED ORDER — ONDANSETRON HCL 4 MG/2ML IJ SOLN: 4.0000 mg | Freq: Four times a day (QID) | INTRAMUSCULAR | Status: DC | PRN

## 2014-12-25 MED ORDER — ACETAMINOPHEN 325 MG PO TABS: 650.0000 mg | ORAL_TABLET | Freq: Four times a day (QID) | ORAL | Status: DC | PRN

## 2014-12-25 MED ORDER — GLYCOPYRROLATE 0.2 MG/ML IJ SOLN
INTRAMUSCULAR | Status: DC | PRN
  Administered 2014-12-25: .4 mg via INTRAVENOUS

## 2014-12-25 MED ORDER — METOPROLOL TARTRATE 12.5 MG HALF TABLET
12.5000 mg | ORAL_TABLET | Freq: Two times a day (BID) | ORAL | Status: DC
  Administered 2014-12-25 – 2014-12-28 (×6): 12.5 mg via ORAL
  Filled 2014-12-25 (×6): qty 1

## 2014-12-25 MED ORDER — SERTRALINE HCL 100 MG PO TABS
100.0000 mg | ORAL_TABLET | Freq: Every day | ORAL | Status: DC
  Administered 2014-12-26 – 2014-12-28 (×3): 100 mg via ORAL
  Filled 2014-12-25 (×3): qty 1

## 2014-12-25 MED ORDER — PANTOPRAZOLE SODIUM 40 MG PO TBEC
40.0000 mg | DELAYED_RELEASE_TABLET | Freq: Every day | ORAL | Status: DC
  Administered 2014-12-26 – 2014-12-28 (×3): 40 mg via ORAL
  Filled 2014-12-25 (×3): qty 1

## 2014-12-25 MED ORDER — ONDANSETRON HCL 4 MG/2ML IJ SOLN
4.0000 mg | Freq: Once | INTRAMUSCULAR | Status: AC
  Administered 2014-12-25: 4 mg via INTRAVENOUS
  Filled 2014-12-25: qty 2

## 2014-12-25 MED ORDER — MORPHINE SULFATE (PF) 2 MG/ML IV SOLN
1.0000 mg | INTRAVENOUS | Status: DC | PRN
  Administered 2014-12-25 – 2014-12-26 (×7): 1 mg via INTRAVENOUS
  Filled 2014-12-25 (×7): qty 1

## 2014-12-25 SURGICAL SUPPLY — 40 items
BIT DRILL CANN LG 4.3MM (BIT) ×1 IMPLANT
BLADE SURG 15 STRL LF DISP TIS (BLADE) ×1 IMPLANT
BLADE SURG 15 STRL SS (BLADE) ×2
CANISTER SUCT 3000ML PPV (MISCELLANEOUS) ×3 IMPLANT
CHLORAPREP W/TINT 26ML (MISCELLANEOUS) ×3 IMPLANT
COVER MAYO STAND STRL (DRAPES) ×3 IMPLANT
COVER PERINEAL POST (MISCELLANEOUS) ×3 IMPLANT
COVER SURGICAL LIGHT HANDLE (MISCELLANEOUS) ×3 IMPLANT
DRAPE STERI IOBAN 125X83 (DRAPES) ×3 IMPLANT
DRAPE U-SHAPE 47X51 STRL (DRAPES) ×3 IMPLANT
DRILL BIT CANN LG 4.3MM (BIT) ×3
DRSG MEPILEX BORDER 4X4 (GAUZE/BANDAGES/DRESSINGS) ×6 IMPLANT
DRSG MEPILEX BORDER 4X8 (GAUZE/BANDAGES/DRESSINGS) IMPLANT
ELECT REM PT RETURN 9FT ADLT (ELECTROSURGICAL) ×3
ELECTRODE REM PT RTRN 9FT ADLT (ELECTROSURGICAL) ×1 IMPLANT
GLOVE BIO SURGEON STRL SZ7 (GLOVE) ×3 IMPLANT
GLOVE BIO SURGEON STRL SZ8 (GLOVE) ×3 IMPLANT
GLOVE BIOGEL PI IND STRL 8 (GLOVE) ×1 IMPLANT
GLOVE BIOGEL PI INDICATOR 8 (GLOVE) ×2
GOWN STRL REUS W/ TWL LRG LVL3 (GOWN DISPOSABLE) ×1 IMPLANT
GOWN STRL REUS W/ TWL XL LVL3 (GOWN DISPOSABLE) ×2 IMPLANT
GOWN STRL REUS W/TWL LRG LVL3 (GOWN DISPOSABLE) ×2
GOWN STRL REUS W/TWL XL LVL3 (GOWN DISPOSABLE) ×4
GUIDEPIN 3.2X17.5 THRD DISP (PIN) ×3 IMPLANT
KIT BASIN OR (CUSTOM PROCEDURE TRAY) ×3 IMPLANT
KIT ROOM TURNOVER OR (KITS) ×3 IMPLANT
LINER BOOT UNIVERSAL DISP (MISCELLANEOUS) ×3 IMPLANT
NAIL HIP FRACT 130D 11X180 (Screw) ×3 IMPLANT
NS IRRIG 1000ML POUR BTL (IV SOLUTION) ×3 IMPLANT
PACK GENERAL/GYN (CUSTOM PROCEDURE TRAY) ×3 IMPLANT
PAD ARMBOARD 7.5X6 YLW CONV (MISCELLANEOUS) ×6 IMPLANT
SCREW BONE CORTICAL 5.0X32 (Screw) ×3 IMPLANT
SCREW LAG HIP NAIL 10.5X95 (Screw) ×3 IMPLANT
SPONGE LAP 18X18 X RAY DECT (DISPOSABLE) ×3 IMPLANT
STAPLER VISISTAT 35W (STAPLE) ×9 IMPLANT
SUT MNCRL AB 3-0 PS2 18 (SUTURE) ×3 IMPLANT
SUT VIC AB 0 CT1 27 (SUTURE) ×2
SUT VIC AB 0 CT1 27XBRD ANBCTR (SUTURE) ×1 IMPLANT
TRAY FOLEY CATH 16FRSI W/METER (SET/KITS/TRAYS/PACK) ×3 IMPLANT
WATER STERILE IRR 1000ML POUR (IV SOLUTION) ×3 IMPLANT

## 2014-12-25 NOTE — Transfer of Care (Signed)
Immediate Anesthesia Transfer of Care Note  Patient: [name]  Procedure(s) Performed: Procedure(s): INTRAMEDULLARY (IM) NAIL INTERTROCHANTRIC (Left)  Patient Location: PACU  Anesthesia Type:General  Level of Consciousness: awake, alert  and oriented  Airway & Oxygen Therapy: Patient Spontanous Breathing and Patient connected to nasal cannula oxygen  Post-op Assessment: Report given to RN and Post -op Vital signs reviewed and stable  Post vital signs: Reviewed and stable  Last Vitals:  Filed Vitals:   12/25/14 1558 12/25/14 1600  BP:  118/57  Pulse: 91 98  Resp: 17 20    Complications: No apparent anesthesia complications

## 2014-12-25 NOTE — ED Notes (Signed)
Pt in from [facility] where EMS reports she fell from standing position to wooden kitchen floor, pt c/o L upper thigh pain, no shortening or rotation present, pt has L leg immobilizer d/t hx of L ankle fracture, pt rcvd 250 mcg Fentanyl PTA, pt rcvd 250 ml NS pta, A&O x4

## 2014-12-25 NOTE — ED Provider Notes (Signed)
CSN: [id number]     Arrival date & time 12/25/14  1222 History   First MD Initiated Contact with Patient 12/25/14 1224     Chief Complaint  Patient presents with  . Fall     (Consider location/radiation/quality/duration/timing/severity/associated sxs/prior Treatment) HPI 69 year old female with recent trimalleolar fracture status post repair 6 weeks ago at [facility] house for rehabilitation who was at her daughter's house and fell injuring her left hip. She states that she tripped over a chair and daughter concurs that this was a mechanical fall. She denies injury besides to her left hip. She was unable to get up after the fall due to pain. She did not strike her head and denies any alteration in her level of consciousness. She denies headache, neck pain, chest pain, dyspnea, abdominal pain, nausea, vomiting, peripheral numbness or tingling distal to the injury. Past Medical History  Diagnosis Date  . Chicken pox   . Chest pain   . SOB (shortness of breath) on exertion   . Fall 06/15/2011    Caused discomfort in lower left rib and left lateral hip area  . H/O: hysterectomy 1985  . Depressive disorder   . Pre-syncope   . Dizziness   . OCD (obsessive compulsive disorder)   . Anxiety   . Opioid abuse   . Hypokalemia   . Severe sepsis (HCC)   . Anemia   . Azotemia   . Left trimalleolar fracture   . Respiratory failure (HCC)   . History of MRSA infection   . Leukocytosis    Past Surgical History  Procedure Laterality Date  . Gallbladder surgery    . Appendectomy    . Total abdominal hysterectomy     Family History  Problem Relation Age of Onset  . Heart attack Mother   . Colon cancer Father   . Diabetes Brother   . Heart attack Brother 77    MI   Social History  Substance Use Topics  . Smoking status: Never Smoker   . Smokeless tobacco: None  . Alcohol Use: No   OB History    No data available     Review of Systems  All other systems reviewed and are  negative.     Allergies  Sulfa antibiotics  Home Medications   Prior to Admission medications   Medication Sig Start Date End Date Taking? Authorizing Provider  acetaminophen (TYLENOL) 500 MG tablet Take one tablet by mouth every 6 hours as needed for mild pain   Yes Historical Provider, MD  guaiFENesin-dextromethorphan (ROBITUSSIN DM) 100-10 MG/5ML syrup 10ml by mouth every 4 hours as needed for cough   Yes Historical Provider, MD  heparin 5000 UNIT/ML injection Inject 5,000 Units into the skin every 8 (eight) hours.   Yes Historical Provider, MD  ipratropium-albuterol (DUONEB) 0.5-2.5 (3) MG/3ML SOLN Take 3 mLs by nebulization every 6 (six) hours as needed.    Yes Historical Provider, MD  LORazepam (ATIVAN) 0.5 MG tablet Take 1 tablet (0.5 mg total) by mouth every 6 (six) hours as needed for anxiety. 11/26/14  Yes Kimber Relic, MD  metoprolol tartrate (LOPRESSOR) 25 MG tablet Take 12.5 mg by mouth 2 (two) times daily.   Yes Historical Provider, MD  oxyCODONE (OXY IR/ROXICODONE) 5 MG immediate release tablet Take one to two tablets by mouth every 4 hours as needed for moderate to severe pain   Yes Historical Provider, MD  pantoprazole (PROTONIX) 40 MG tablet Take 40 mg by mouth daily. Before breakfast  Yes Historical Provider, MD  Protein (PROCEL PO) Two scoops by mouth twice daily   Yes Historical Provider, MD  sertraline (ZOLOFT) 100 MG tablet Take 100 mg by mouth daily.   Yes Historical Provider, MD   BP 121/55 mmHg  Pulse 87  Resp 24  Ht 5\' 2"  (1.575 m)  Wt 51.256 kg  BMI 20.66 kg/m2  SpO2 99% Physical Exam  Constitutional: She is oriented to person, place, and time. She appears well-developed and well-nourished. No distress.  HENT:  Head: Normocephalic and atraumatic.  Right Ear: External ear normal.  Left Ear: External ear normal.  Nose: Nose normal.  Mouth/Throat: Oropharynx is clear and moist.  Eyes: Conjunctivae and EOM are normal. Pupils are equal, round, and  reactive to light.  Neck: Normal range of motion. Neck supple.  Cardiovascular: Normal rate, regular rhythm, normal heart sounds and intact distal pulses.   Pulmonary/Chest: Effort normal and breath sounds normal.  Abdominal: Soft. Bowel sounds are normal.  Musculoskeletal:       Legs: Patient with tenderness palpation left lateral hip. No skin laceration or contusion noted. Sensation is intact distal to injury. Dorsal pedalis total pulse is intact. Patient has had recent ORIF left ankle. Left ankle is appears slightly swollen. There is some healing skin on the anterior aspect of the left ankle without surrounding erythema  Neurological: She is alert and oriented to person, place, and time. No cranial nerve deficit. Coordination normal.  Skin: Skin is warm and dry.  Psychiatric: She has a normal mood and affect.  Nursing note and vitals reviewed.   ED Course  Procedures (including critical care time) Labs Review Labs Reviewed  COMPREHENSIVE METABOLIC PANEL - Abnormal; Notable for the following:    Sodium 134 (*)    Chloride 97 (*)    Glucose, Bld 141 (*)    Creatinine, Ser 1.27 (*)    GFR calc non Af Amer 42 (*)    GFR calc Af Amer 48 (*)    All other components within normal limits  CBC WITH DIFFERENTIAL/PLATELET - Abnormal; Notable for the following:    WBC 12.3 (*)    Hemoglobin 11.6 (*)    HCT 35.0 (*)    Neutro Abs 10.6 (*)    All other components within normal limits  URINE CULTURE  APTT  PROTIME-INR  TYPE AND SCREEN    Imaging Review Dg Chest 1 View  12/25/2014  CLINICAL DATA:  Fall today with left femoral fracture EXAM: CHEST  1 VIEW COMPARISON:  08/07/2014 FINDINGS: Cardiac shadow is within normal limits. Patient is significantly rotated to the left accentuating the mediastinal markings. Some mild spiculated density is noted in the right lung base which may be accentuated by the patient rotation. Frontal and lateral view of the chest is recommended when the patient's  clinical condition improves to allow for optimum imaging. IMPRESSION: Changes in the right lung base as described above. Follow-up two view chest x-Rena Hunke is recommended when patient's condition improves. Electronically Signed   By: Alcide CleverMark  Lukens M.D.   On: 12/25/2014 13:21   Dg Hip Unilat With Pelvis 2-3 Views Left  12/25/2014  CLINICAL DATA:  Left hip pain after falling today. Initial encounter. EXAM: DG HIP (WITH OR WITHOUT PELVIS) 2-3V LEFT COMPARISON:  None. FINDINGS: The bones are demineralized. There is a comminuted and angulated intertrochanteric left femur fracture. No associated dislocation. There are asymmetric degenerative changes of the left hip. IMPRESSION: Acute intertrochanteric left femur fracture with mild displacement and angulation.  No dislocation. Electronically Signed   By: Carey Bullocks M.D.   On: 12/25/2014 13:18   I have personally reviewed and evaluated these images and lab results as part of my medical decision-making.   EKG Interpretation None      MDM   Final diagnoses:  Pain    69 year old female mechanical fall left intertrochanteric fracture. Patient with recent malleolar fracture after which daughter reports she had pneumonia. Chest x-Leather Estis is clear. I have discussed the patient's care with Dr. Victorino Dike. He plans take the patient to the operating room today. Hospitalists are consulted for medical clearance prior to surgery. I have discussed this with the patient and her daughter. Her last by mouth intake was at 9 AM. She has been on subcutaneous heparin and received her last dose at 6 AM. She is instructed regarding remaining nothing by mouth and voices understanding.    Margarita Grizzle, MD 12/25/14 [phone]

## 2014-12-25 NOTE — Consult Note (Signed)
Reason for Consult:left hip pain Referring Physician:  Dr. Diamond Nickel is an 69 y.o. female.  HPI: 69 y/o female with PMH of recent pneumonia after surgery to fix and ankle fracture fell this morning at her daughter's house.  She was unable to bear weight after the fall and c/o left hip pain.  She denies any h/o broken hip before.  She under went ORIF of the left ankle in [place] 6 weeks ago.  She ahs been WB in a cam boot on the left for a few days.  She c/o aching pain in the left hip that is worse with motion and better with rest.  No recent f/c/n/v/wt loss.  She is in rehab at [facility].  She takes heparin for DVT prophylaxis and last had a dose early this morning.  She last ate at 0700.   Past Medical History  Diagnosis Date  . Chicken pox   . Chest pain   . SOB (shortness of breath) on exertion   . Fall 06/15/2011    Caused discomfort in lower left rib and left lateral hip area  . H/O: hysterectomy 1985  . Depressive disorder   . Pre-syncope   . Dizziness   . OCD (obsessive compulsive disorder)   . Anxiety   . Opioid abuse   . Hypokalemia   . Severe sepsis ([place])   . Anemia   . Azotemia   . Left trimalleolar fracture   . Respiratory failure ([place])   . History of MRSA infection   . Leukocytosis     Past Surgical History  Procedure Laterality Date  . Gallbladder surgery    . Appendectomy    . Total abdominal hysterectomy      Family History  Problem Relation Age of Onset  . Heart attack Mother   . Colon cancer Father   . Diabetes Brother   . Heart attack Brother 54    MI    Social History:  reports that she has never smoked. She does not have any smokeless tobacco history on file. She reports that she does not drink alcohol or use illicit drugs.  Allergies:  Allergies  Allergen Reactions  . Sulfa Antibiotics     Hives     Medications: I have reviewed the patient's current medications.  Results for orders placed or performed during the hospital  encounter of 12/25/14 (from the past 48 hour(s))  APTT     Status: None   Collection Time: 12/25/14  1:13 PM  Result Value Ref Range   aPTT 30 24 - 37 seconds  Comprehensive metabolic panel     Status: Abnormal   Collection Time: 12/25/14  1:13 PM  Result Value Ref Range   Sodium 134 (L) 135 - 145 mmol/L   Potassium 5.1 3.5 - 5.1 mmol/L   Chloride 97 (L) 101 - 111 mmol/L   CO2 25 22 - 32 mmol/L   Glucose, Bld 141 (H) 65 - 99 mg/dL   BUN 16 6 - 20 mg/dL   Creatinine, Ser 1.27 (H) 0.44 - 1.00 mg/dL   Calcium 9.7 8.9 - 10.3 mg/dL   Total Protein 7.1 6.5 - 8.1 g/dL   Albumin 3.5 3.5 - 5.0 g/dL   AST 28 15 - 41 U/L   ALT 23 14 - 54 U/L   Alkaline Phosphatase 120 38 - 126 U/L   Total Bilirubin 0.8 0.3 - 1.2 mg/dL   GFR calc non Af Amer 42 (L) >60 mL/min  GFR calc Af Amer 48 (L) >60 mL/min    Comment: (NOTE) The eGFR has been calculated using the CKD EPI equation. This calculation has not been validated in all clinical situations. eGFR's persistently <60 mL/min signify possible Chronic Kidney Disease.    Anion gap 12 5 - 15  CBC WITH DIFFERENTIAL     Status: Abnormal   Collection Time: 12/25/14  1:13 PM  Result Value Ref Range   WBC 12.3 (H) 4.0 - 10.5 K/uL   RBC 3.89 3.87 - 5.11 MIL/uL   Hemoglobin 11.6 (L) 12.0 - 15.0 g/dL   HCT 35.0 (L) 36.0 - 46.0 %   MCV 90.0 78.0 - 100.0 fL   MCH 29.8 26.0 - 34.0 pg   MCHC 33.1 30.0 - 36.0 g/dL   RDW 15.1 11.5 - 15.5 %   Platelets 361 150 - 400 K/uL   Neutrophils Relative % 86 %   Neutro Abs 10.6 (H) 1.7 - 7.7 K/uL   Lymphocytes Relative 9 %   Lymphs Abs 1.1 0.7 - 4.0 K/uL   Monocytes Relative 5 %   Monocytes Absolute 0.6 0.1 - 1.0 K/uL   Eosinophils Relative 0 %   Eosinophils Absolute 0.0 0.0 - 0.7 K/uL   Basophils Relative 0 %   Basophils Absolute 0.0 0.0 - 0.1 K/uL  Protime-INR     Status: None   Collection Time: 12/25/14  1:13 PM  Result Value Ref Range   Prothrombin Time 14.4 11.6 - 15.2 seconds   INR 1.10 0.00 - 1.49   Type and screen [place]     Status: None   Collection Time: 12/25/14  1:25 PM  Result Value Ref Range   ABO/RH(D) A POS    Antibody Screen NEG    Sample Expiration 12/28/2014   ABO/Rh     Status: None   Collection Time: 12/25/14  1:25 PM  Result Value Ref Range   ABO/RH(D) A POS   Urinalysis, Routine w reflex microscopic (not at [facility])     Status: Abnormal   Collection Time: 12/25/14  3:55 PM  Result Value Ref Range   Color, Urine AMBER (A) YELLOW    Comment: BIOCHEMICALS MAY BE AFFECTED BY COLOR   APPearance CLEAR CLEAR   Specific Gravity, Urine 1.020 1.005 - 1.030   pH 5.5 5.0 - 8.0   Glucose, UA NEGATIVE NEGATIVE mg/dL   Hgb urine dipstick NEGATIVE NEGATIVE   Bilirubin Urine NEGATIVE NEGATIVE   Ketones, ur NEGATIVE NEGATIVE mg/dL   Protein, ur NEGATIVE NEGATIVE mg/dL   Nitrite NEGATIVE NEGATIVE   Leukocytes, UA NEGATIVE NEGATIVE    Comment: MICROSCOPIC NOT DONE ON URINES WITH NEGATIVE PROTEIN, BLOOD, LEUKOCYTES, NITRITE, OR GLUCOSE <1000 mg/dL.    Dg Chest 1 View  12/25/2014  CLINICAL DATA:  Fall today with left femoral fracture EXAM: CHEST  1 VIEW COMPARISON:  08/07/2014 FINDINGS: Cardiac shadow is within normal limits. Patient is significantly rotated to the left accentuating the mediastinal markings. Some mild spiculated density is noted in the right lung base which may be accentuated by the patient rotation. Frontal and lateral view of the chest is recommended when the patient's clinical condition improves to allow for optimum imaging. IMPRESSION: Changes in the right lung base as described above. Follow-up two view chest x-ray is recommended when patient's condition improves. Electronically Signed   By: Inez Catalina M.D.   On: 12/25/2014 13:21   Dg Hip Unilat With Pelvis 2-3 Views Left  12/25/2014  CLINICAL DATA:  Left hip pain after falling today. Initial encounter. EXAM: DG HIP (WITH OR WITHOUT PELVIS) 2-3V LEFT COMPARISON:  None. FINDINGS: The bones  are demineralized. There is a comminuted and angulated intertrochanteric left femur fracture. No associated dislocation. There are asymmetric degenerative changes of the left hip. IMPRESSION: Acute intertrochanteric left femur fracture with mild displacement and angulation. No dislocation. Electronically Signed   By: Richardean Sale M.D.   On: 2014-12-26 13:18    ROS:  No recent f/c/n/v/wt loss PE:  Blood pressure 118/57, pulse 98, resp. rate 20, height '5\' 2"'  (1.575 m), weight 51.256 kg (113 lb), SpO2 98 %. wn wd elderly woman in nad.  A and O x 4.   Mood and affect normal.  EOMI.  resp unlabored.  L hip with healthy skin.  No lymphadenopathy.  Brisk cap refill at the left toes.  L LE slightly short.  sens to LT intact at the L LE.  5/5 strength in pF and DF of the left toes.  Assessment/Plan: L proximal femur intertroch fracture - to OR for open treatment of left hip IT fx with intramedullary nailing.  The risks and benefits of the alternative treatment options have been discussed in detail.  The patient wishes to proceed with surgery and specifically understands risks of bleeding, infection, nerve damage, blood clots, need for additional surgery, amputation and death.   Wylene Simmer Dec 26, 2014, 4:38 PM

## 2014-12-25 NOTE — Op Note (Signed)
[name]:  [name], [name]                ACCOUNT NO.:  [id number]  MEDICAL RECORD NO.:  [id number]  LOCATION:  [id number]                        FACILITY:  [facility]  PHYSICIAN:  Toni ArthursJohn Baylor Teegarden, MD        DATE OF BIRTH:  Oct 16, 1945  DATE OF PROCEDURE:  12/25/2014 DATE OF DISCHARGE:  12/25/2014                              OPERATIVE REPORT   PREOPERATIVE DIAGNOSIS:  Left proximal femur intertrochanteric fracture.  POSTOPERATIVE DIAGNOSIS:  Left proximal femur intertrochanteric fracture.  PROCEDURE:  Open treatment of left hip intertrochanteric fracture with intramedullary nailing.  ANESTHESIA:  General.  ESTIMATED BLOOD LOSS:  50 mL.  TOURNIQUET TIME:  Zero.  COMPLICATIONS:  None apparent.  DISPOSITION:  Extubated, awake, and stable to recovery.  INDICATIONS FOR PROCEDURE:  The patient is a 69 year old female with past medical history significant for a recent pneumonia.  She also had pneumonia occurred after an ankle fracture 6 weeks ago that was treated operatively.  She is currently residing at [facility].  She went home with her daughter today for Thanksgiving and fell at her daughter's home.  She was brought to the emergency room where radiographs revealed a left proximal femur intertrochanteric fracture.  She presents now for operative treatment of this unstable injury.  She understands the risks and benefits, the alternative treatment options, and elects surgical treatment.  She specifically understands the risks of bleeding, infection, nerve damage, blood clots, need for additional surgery, continued pain, nonunion, amputation, and death.  PROCEDURE IN DETAIL:  After preoperative consent was obtained and the correct operative site was identified the patient was brought to the operating room and placed supine on the operating table.  General anesthesia was induced.  Preoperative antibiotics were administered. Surgical time-out was taken.  Left lower extremity was positioned in  a traction boot.  The right lower extremity was positioned in a well leg holder.  A reduction maneuver was performed with traction external rotation and abduction followed by internal rotation and abduction.  AP and lateral radiographs confirmed appropriate reduction of the fracture. Left lower extremity was then prepped and draped in standard sterile fashion.  A longitudinal incision was then made at the tip of the greater trochanter.  Sharp dissection was carried down through skin and subcutaneous tissue.  The gluteal fascia was split in line with its fibers and the tip of the greater trochanter was palpated.  A guide pin was inserted at the tip of the trochanter and advanced into the medullary canal.  AP and lateral radiographs confirmed appropriate position of the guide pin.  The guide pin was then over reamed.  An 11 mm Biomet AFFIXUS nail was inserted.  This was a short nail that was seated at the tip of the trochanter.  AP and lateral radiographs confirmed appropriate depth of the nail.  The targeting guide was then used to appropriately target the femoral head.  A small incision was made and a guide pin was inserted through the lateral femoral cortex in the femoral head.  AP and lateral radiographs confirmed appropriate alignment of the pin.  The guide pin was then over reamed and a 95 mm lag screw was inserted.  It was  compressed and the fracture site was noted to compress appropriately.  The screw was then secured to the nail with the proximal screw allowing it to slide within the hole.  Targeting guide was then used to insert a single interlocking screw at the distal end of the nail in the static position.  AP and lateral radiographs confirmed appropriate position and length of all hardware.  Wounds were irrigated copiously.  The IP band was closed with 0 Vicryl. Subcutaneous tissues were approximated with inverted simple sutures of 3- 0 Monocryl.  Staples were used to close  the 3 skin incisions.  Sterile dressings were applied.  The patient was awakened from anesthesia and transported to the recovery room in stable condition.  FOLLOWUP PLAN:  The patient will be weightbearing as tolerated on the left lower extremity.  She will be able to start DVT prophylaxis tomorrow.  She will have physical therapy, occupational therapy, and social Work consults.     Toni Arthurs, MD     JH/MEDQ  D:  12/25/2014  T:  12/25/2014  Job:  [id number]

## 2014-12-25 NOTE — Anesthesia Preprocedure Evaluation (Addendum)
Anesthesia Evaluation  Patient identified by MRN, date of birth, ID band Patient awake    Reviewed: Allergy & Precautions, NPO status , Patient's Chart, lab work & pertinent test results, reviewed documented beta blocker date and time   History of Anesthesia Complications Negative for: history of anesthetic complications  Airway Mallampati: II  TM Distance: >3 FB Neck ROM: Full    Dental  (+) Dental Advisory Given, Upper Dentures, Edentulous Upper   Pulmonary pneumonia, resolved,    Pulmonary exam normal breath sounds clear to auscultation       Cardiovascular hypertension, Pt. on medications and Pt. on home beta blockers (-) angina(-) CAD, (-) Past MI and (-) CHF Normal cardiovascular exam Rhythm:Regular Rate:Normal  History of sinus tachycardia  TTE 08/08/14: Study Conclusions  - Left ventricle: The cavity size was normal. Wall thickness wasnormal. Systolic function was normal. The estimated ejection fraction was in the range of 60% to 65%. Wall motion was normal;there were no regional wall motion abnormalities. Dopplerparameters are consistent with abnormal left ventricularrelaxation (grade 1 diastolic dysfunction). - Mitral valve: Calcified annulus.   Neuro/Psych PSYCHIATRIC DISORDERS Anxiety Depression negative neurological ROS     GI/Hepatic Neg liver ROS, GERD  Medicated,  Endo/Other  negative endocrine ROS  Renal/GU Renal disease (AKI)     Musculoskeletal   Abdominal   Peds  Hematology  (+) Blood dyscrasia, anemia ,   Anesthesia Other Findings Day of surgery medications reviewed with the patient.  Reproductive/Obstetrics negative OB ROS                           Anesthesia Physical Anesthesia Plan  ASA: III  Anesthesia Plan: General   Post-op Pain Management:    Induction: Intravenous  Airway Management Planned: Oral ETT  Additional Equipment:   Intra-op Plan:    Post-operative Plan: Possible Post-op intubation/ventilation  Informed Consent: I have reviewed the patients History and Physical, chart, labs and discussed the procedure including the risks, benefits and alternatives for the proposed anesthesia with the patient or authorized representative who has indicated his/her understanding and acceptance.   Dental advisory given  Plan Discussed with: CRNA  Anesthesia Plan Comments: (Risks/benefits of general anesthesia discussed with patient including risk of damage to teeth, lips, gum, and tongue, nausea/vomiting, allergic reactions to medications, and the possibility of heart attack, stroke and death.  All patient questions answered.  Patient wishes to proceed.)       Anesthesia Quick Evaluation

## 2014-12-25 NOTE — Anesthesia Procedure Notes (Signed)
Procedure Name: Intubation Date/Time: 12/25/2014 5:21 PM Performed by: Daiva EvesAVENEL, Colton Engdahl W Pre-anesthesia Checklist: Patient identified, Timeout performed, Emergency Drugs available, Suction available and Patient being monitored Patient Re-evaluated:Patient Re-evaluated prior to inductionOxygen Delivery Method: Circle system utilized Preoxygenation: Pre-oxygenation with 100% oxygen Intubation Type: IV induction Ventilation: Mask ventilation without difficulty Laryngoscope Size: Mac and 3 Grade View: Grade II Tube type: Subglottic suction tube Tube size: 7.0 mm Number of attempts: 1 Airway Equipment and Method: Stylet Placement Confirmation: ETT inserted through vocal cords under direct vision,  positive ETCO2 and CO2 detector Secured at: 22 cm Tube secured with: Tape Dental Injury: Teeth and Oropharynx as per pre-operative assessment

## 2014-12-25 NOTE — Progress Notes (Signed)
Orthopedic Tech Progress Note Patient Details:  [name] 09/17/1945 [id number]  Ortho Devices Ortho Device/Splint Location: applied ohf to bed Ortho Device/Splint Interventions: Ordered, Application   Jennye MoccasinHughes, Ruqaya Strauss Craig 12/25/2014, 9:55 PM

## 2014-12-25 NOTE — H&P (Signed)
[facility] History and Physical  Madison AbtsJudy L Evrard ZOX:[id number] DOB: 08/26/1945 DOA: 12/25/2014  Referring physician: Emergency Department PCP: [facility]   CHIEF COMPLAINT:  Hip fracture following a fall today   HPI: [name] is a 69 y.o. female, relatively healthy, who presents with a left femur fracture after a fall today. Patient had a recent left ankle fracture repaired in [place] approx one month ago. Post-op course complicated by bilateral PNA / respiratory failure requiring mechanical ventilation per daughter. Daughter says there was a question of whether the pneumonia was present prior to surgery . Patient has been at [facility] since surgery 4 weeks ago. Just this past Tuesday patient began bearing weight on her left ankle  Patient was at daughter's home this morning for Thanksgiving when she fell on left hip. The fall occurred while patient was attempting to sit down and the chair came out from under her.  Other than left hip pain, patient has no complaints. No cough or chest pain. She has had some occasional shortness of breath during physical therapy sessions. She has had a few nebulizers at the nursing home over the last 4 weeks.  Bowels moving normally. No urinary symptoms. Weight stable.    ED COURSE:   Hip x-ray reveals acute intertrochanteric left femur fracture  Labs:   Sodium 134, BUN 16, creatinine 1.27, LFTs normal, WBC 12.3 hemoglobin 11.6 INR 1.1  CXR:  Mild spiculated density noted in the right lung base which may be accentuated by the patient's rotation    EKG:    Sinus rhythm Incomplete right bundle branch block No significant change since last tracing 08/07/14 QT 459 Confirmed by RAY MD, DANIELLE ([id number]   Medications  oxyCODONE-acetaminophen (PERCOCET/ROXICET) 5-325 MG per tablet 1-2 tablet (2 tablets Oral Given 12/25/14 1403)  ondansetron (ZOFRAN) injection 4 mg (4 mg Intravenous Given 12/25/14  1253)   Review of Systems  Constitutional: Negative.   HENT: Negative.   Eyes: Negative.   Respiratory: Positive for shortness of breath.   Cardiovascular: Negative.   Gastrointestinal: Negative.   Genitourinary: Negative.   Musculoskeletal: Positive for falls.  Skin: Negative.   Endo/Heme/Allergies: Negative.   Psychiatric/Behavioral: Negative.     Past Medical History  Diagnosis Date  . Chicken pox   . SOB (shortness of breath) on exertion   . Fall 06/15/2011    Caused discomfort in lower left rib and left lateral hip area  . H/O: hysterectomy 1985  . Depressive disorder   . Pre-syncope   . Dizziness   . OCD (obsessive compulsive disorder)   . Anxiety   . Opioid abuse   . Hypokalemia   . Severe sepsis (HCC)   . Anemia   . Azotemia   . Left trimalleolar fracture   . Respiratory failure (HCC)   . History of MRSA infection   . Leukocytosis    Past Surgical History  Procedure Laterality Date  . Gallbladder surgery    . Appendectomy    . Total abdominal hysterectomy      SOCIAL HISTORY:  reports that she has never smoked. She does not have any smokeless tobacco history on file. She reports that she does not drink alcohol or use illicit drugs. [facility]:   [facility]   Assistive devices:   Just began bearing weight on left ankle post fracture repair .   Allergies  Allergen Reactions  . Sulfa Antibiotics     Hives  Family History  Problem Relation Age of Onset  . Heart attack Mother   . Colon cancer Father   . Diabetes Brother   . Heart attack Brother 64    MI    Prior to Admission medications   Medication Sig Start Date End Date Taking? Authorizing Provider  acetaminophen (TYLENOL) 500 MG tablet Take one tablet by mouth every 6 hours as needed for mild pain   Yes Historical Provider, MD  guaiFENesin-dextromethorphan (ROBITUSSIN DM) 100-10 MG/5ML syrup 10ml by mouth every 4 hours as needed for cough   Yes Historical Provider, MD  heparin  5000 UNIT/ML injection Inject 5,000 Units into the skin every 8 (eight) hours.   Yes Historical Provider, MD  ipratropium-albuterol (DUONEB) 0.5-2.5 (3) MG/3ML SOLN Take 3 mLs by nebulization every 6 (six) hours as needed.    Yes Historical Provider, MD  LORazepam (ATIVAN) 0.5 MG tablet Take 1 tablet (0.5 mg total) by mouth every 6 (six) hours as needed for anxiety. 11/26/14  Yes Kimber Relic, MD  metoprolol tartrate (LOPRESSOR) 25 MG tablet Take 12.5 mg by mouth 2 (two) times daily.   Yes Historical Provider, MD  oxyCODONE (OXY IR/ROXICODONE) 5 MG immediate release tablet Take one to two tablets by mouth every 4 hours as needed for moderate to severe pain   Yes Historical Provider, MD  pantoprazole (PROTONIX) 40 MG tablet Take 40 mg by mouth daily. Before breakfast   Yes Historical Provider, MD  Protein (PROCEL PO) Two scoops by mouth twice daily   Yes Historical Provider, MD  sertraline (ZOLOFT) 100 MG tablet Take 100 mg by mouth daily.   Yes Historical Provider, MD   PHYSICAL EXAM: Filed Vitals:   12/25/14 1245 12/25/14 1330 12/25/14 1345 12/25/14 1400  BP: 107/53 118/54 121/55 124/58  Pulse: 86 87  98  Resp:   24 25  Height:      Weight:      SpO2: 100% 99%  97%    Wt Readings from Last 3 Encounters:  12/25/14 51.256 kg (113 lb)  11/26/14 56.246 kg (124 lb)  11/25/14 56.246 kg (124 lb)    General:  Pleasant white female. Appears calm and comfortable Eyes: PER, normal lids, irises & conjunctiva ENT: grossly normal hearing, lips & tongue Neck: no LAD, no masses Cardiovascular: RRR, no murmurs. No LE edema.  Respiratory: Respirations even and unlabored. Normal respiratory effort. Lungs CTA bilaterally, no wheezes / rales .   Abdomen: soft, non-distended, non-tender, active bowel sounds. No obvious masses.  Skin: no rash seen on limited exam Musculoskeletal: grossly normal tone BUE/BLE Psychiatric: grossly normal mood and affect, speech fluent and appropriate Neurologic: grossly  non-focal.         LABS ON ADMISSION:    Basic Metabolic Panel:  Recent Labs Lab 12/25/14 1313  NA 134*  K 5.1  CL 97*  CO2 25  GLUCOSE 141*  BUN 16  CREATININE 1.27*  CALCIUM 9.7   Liver Function Tests:  Recent Labs Lab 12/25/14 1313  AST 28  ALT 23  ALKPHOS 120  BILITOT 0.8  PROT 7.1  ALBUMIN 3.5    CBC:  Recent Labs Lab 12/25/14 1313  WBC 12.3*  NEUTROABS 10.6*  HGB 11.6*  HCT 35.0*  MCV 90.0  PLT 361    CREATININE: 1.27 mg/dL ABNORMAL (08/65/78 4696) Estimated creatinine clearance - 32.6 mL/min  Radiological Exams on Admission: Dg Chest 1 View  12/25/2014  CLINICAL DATA:  Fall today with left femoral fracture EXAM: CHEST  1 VIEW COMPARISON:  08/07/2014 FINDINGS: Cardiac shadow is within normal limits. Patient is significantly rotated to the left accentuating the mediastinal markings. Some mild spiculated density is noted in the right lung base which may be accentuated by the patient rotation. Frontal and lateral view of the chest is recommended when the patient's clinical condition improves to allow for optimum imaging. IMPRESSION: Changes in the right lung base as described above. Follow-up two view chest x-ray is recommended when patient's condition improves. Electronically Signed   By: Alcide Clever M.D.   On: 12/25/2014 13:21   Dg Hip Unilat With Pelvis 2-3 Views Left  12/25/2014  CLINICAL DATA:  Left hip pain after falling today. Initial encounter. EXAM: DG HIP (WITH OR WITHOUT PELVIS) 2-3V LEFT COMPARISON:  None. FINDINGS: The bones are demineralized. There is a comminuted and angulated intertrochanteric left femur fracture. No associated dislocation. There are asymmetric degenerative changes of the left hip. IMPRESSION: Acute intertrochanteric left femur fracture with mild displacement and angulation. No dislocation. Electronically Signed   By: Carey Bullocks M.D.   On: 12/25/2014 13:18   Echocardiogram July 2016 Study Conclusions  - Left  ventricle: The cavity size was normal. Wall thickness was normal. Systolic function was normal. The estimated ejection fraction was in the range of 60% to 65%. Wall motion was normal; there were no regional wall motion abnormalities. Doppler parameters are consistent with abnormal left ventricular relaxation (grade 1 diastolic dysfunction). - Mitral valve: Calcified annulus.  ASSESSMENT / PLAN    Left intertrochanteric left femur fracture post fall today.  -Orthopedics would like to repair fracture today. [facility] has been asked to give pre-op medical clearance -Will admit to telemetry -Keep NPO until surgery  AKI. Baseline creatinine 0.8, up to 1.27 today. -Gentle IV hydration with 75 cc/hr x 12 hours -AM BMET  Recent pneumonia (post-op after ankle surgery in [place], [place] one month ago). Per daughter, patient was on ventilator for two days. Currently asymptomatic, normal 02 sats but CXR suggests a mild spiculated density in right lung base but patient's rotation may have interfered with study.  -obtain stat chest CTscan to better characterize  CXR findings    Hx of heart palpitations / sinus tachycardia. Saw Cardiology 12/16/14, HR 115.  Metoprolol was increased to  BID. HR stable 80-90's.  -monitor on telemetry -continue Metoprolol   Left Trimalleolar fracture with displacement of the ankle joint S/P ORIF in [place] approximately 4 weeks ago. Has been undergoing PT at [facility].    Chronic depression, resume home anti-depressant in am  History of anxiety. -takes ativan at home. Will given prn IV ativan. Can change to PO after surgery.       History of grade I diastolic dysfunction on echo July 2016. No evidence for acute cardiac decompensation    CONSULTANTS:   Orthopedic Surgery  Code Status: full DVT Prophylaxis: Apply SCDs Family Communication:   Patient alert, oriented and understands plan of care.  Disposition Plan: Discharge back to Rehab  in 3-4 days   Time spent: 60 minutes Willette Cluster  NP [facility] Pager [phone]

## 2014-12-25 NOTE — Discharge Instructions (Addendum)
Activity-You may bear weight on your left leg as tolerated in the CAM walker boot.  You may shower 3 days after your hip surgery.  Place a bandaid or dry dressing over your incisions daily until the staples are removed.  Follow with Primary MD [facility] in 7 days   Get CBC, CMP, 2 view Chest X ray checked  by Primary MD next visit.   Disposition SNF  Diet: Heart Healthy   with feeding assistance and aspiration precautions.  For Heart failure patients - Check your Weight same time everyday, if you gain over 2 pounds, or you develop in leg swelling, experience more shortness of breath or chest pain, call your Primary MD immediately. Follow Cardiac Low Salt Diet and 1.5 lit/day fluid restriction.   On your next visit with your primary care physician please Get Medicines reviewed and adjusted.   Please request your Prim.MD to go over all Hospital Tests and Procedure/Radiological results at the follow up, please get all Hospital records sent to your Prim MD by signing hospital release before you go home.   If you experience worsening of your admission symptoms, develop shortness of breath, life threatening emergency, suicidal or homicidal thoughts you must seek medical attention immediately by calling 911 or calling your MD immediately  if symptoms less severe.  You Must read complete instructions/literature along with all the possible adverse reactions/side effects for all the Medicines you take and that have been prescribed to you. Take any new Medicines after you have completely understood and accpet all the possible adverse reactions/side effects.   Do not drive, operating heavy machinery, perform activities at heights, swimming or participation in water activities or provide baby sitting services if your were admitted for syncope or siezures until you have seen by Primary MD or a Neurologist and advised to do so again.  Do not drive when taking Pain medications.     Do not take more than prescribed Pain, Sleep and Anxiety Medications  Special Instructions: If you have smoked or chewed Tobacco  in the last 2 yrs please stop smoking, stop any regular Alcohol  and or any Recreational drug use.  Wear Seat belts while driving.   Please note  You were cared for by a hospitalist during your hospital stay. If you have any questions about your discharge medications or the care you received while you were in the hospital after you are discharged, you can call the unit and asked to speak with the hospitalist on call if the hospitalist that took care of you is not available. Once you are discharged, your primary care physician will handle any further medical issues. Please note that NO REFILLS for any discharge medications will be authorized once you are discharged, as it is imperative that you return to your primary care physician (or establish a relationship with a primary care physician if you do not have one) for your aftercare needs so that they can reassess your need for medications and monitor your lab values.

## 2014-12-25 NOTE — ED Notes (Signed)
Spoke with CT to facilitate CT chest prior to transport to OR, Victorino DikeHewitt, MD at bedside aware of plan, informed consent obtained

## 2014-12-25 NOTE — Anesthesia Postprocedure Evaluation (Signed)
Anesthesia Post Note  Patient: Madison AbtsJudy L [name]  Procedure(s) Performed: Procedure(s) (LRB): INTRAMEDULLARY (IM) NAIL INTERTROCHANTRIC (Left)  Patient location during evaluation: PACU Anesthesia Type: General Level of consciousness: awake and alert and oriented Pain management: pain level controlled Vital Signs Assessment: post-procedure vital signs reviewed and stable Respiratory status: spontaneous breathing, nonlabored ventilation, respiratory function stable and patient connected to nasal cannula oxygen Cardiovascular status: blood pressure returned to baseline and stable Postop Assessment: No signs of nausea or vomiting Anesthetic complications: no    Last Vitals:  Filed Vitals:   12/25/14 1815 12/25/14 1821  BP: 165/102 143/56  Pulse: 92 89  Temp:    Resp: 16 20    Last Pain:  Filed Vitals:   12/25/14 1822  PainSc: 10-Worst pain ever                 Cecile HearingStephen Edward Ariez Neilan

## 2014-12-25 NOTE — Brief Op Note (Signed)
12/25/2014  6:11 PM  PATIENT:  [name]  69 y.o. female  PRE-OPERATIVE DIAGNOSIS:  Left proximal femur intertrochanteric fracture  POST-OPERATIVE DIAGNOSIS:  same  Procedure(s):  Open treatment of left hip intertrochanteric fracture with intramedullary nailing  SURGEON:  Toni ArthursJohn Jammal Sarr, MD  ASSISTANT: n/a  ANESTHESIA:   General  EBL:  50 cc   TOURNIQUET:  n/a  COMPLICATIONS:  None apparent  DISPOSITION:  Extubated, awake and stable to recovery.  DICTATION ID:  [id number]

## 2014-12-26 DIAGNOSIS — S72002A Fracture of unspecified part of neck of left femur, initial encounter for closed fracture: Secondary | ICD-10-CM

## 2014-12-26 DIAGNOSIS — R52 Pain, unspecified: Secondary | ICD-10-CM | POA: Diagnosis present

## 2014-12-26 DIAGNOSIS — S82852D Displaced trimalleolar fracture of left lower leg, subsequent encounter for closed fracture with routine healing: Secondary | ICD-10-CM | POA: Diagnosis not present

## 2014-12-26 DIAGNOSIS — F329 Major depressive disorder, single episode, unspecified: Secondary | ICD-10-CM | POA: Diagnosis not present

## 2014-12-26 DIAGNOSIS — I5032 Chronic diastolic (congestive) heart failure: Secondary | ICD-10-CM | POA: Diagnosis present

## 2014-12-26 DIAGNOSIS — Z882 Allergy status to sulfonamides status: Secondary | ICD-10-CM | POA: Diagnosis not present

## 2014-12-26 DIAGNOSIS — Z8701 Personal history of pneumonia (recurrent): Secondary | ICD-10-CM | POA: Diagnosis not present

## 2014-12-26 DIAGNOSIS — Y92000 Kitchen of unspecified non-institutional (private) residence as  the place of occurrence of the external cause: Secondary | ICD-10-CM | POA: Diagnosis not present

## 2014-12-26 DIAGNOSIS — Z8679 Personal history of other diseases of the circulatory system: Secondary | ICD-10-CM

## 2014-12-26 DIAGNOSIS — W1830XA Fall on same level, unspecified, initial encounter: Secondary | ICD-10-CM | POA: Diagnosis present

## 2014-12-26 DIAGNOSIS — Z79899 Other long term (current) drug therapy: Secondary | ICD-10-CM | POA: Diagnosis not present

## 2014-12-26 DIAGNOSIS — Z90710 Acquired absence of both cervix and uterus: Secondary | ICD-10-CM | POA: Diagnosis not present

## 2014-12-26 DIAGNOSIS — D62 Acute posthemorrhagic anemia: Secondary | ICD-10-CM | POA: Diagnosis not present

## 2014-12-26 DIAGNOSIS — S72142A Displaced intertrochanteric fracture of left femur, initial encounter for closed fracture: Secondary | ICD-10-CM | POA: Diagnosis present

## 2014-12-26 DIAGNOSIS — I10 Essential (primary) hypertension: Secondary | ICD-10-CM | POA: Diagnosis not present

## 2014-12-26 DIAGNOSIS — N179 Acute kidney failure, unspecified: Secondary | ICD-10-CM | POA: Diagnosis present

## 2014-12-26 DIAGNOSIS — F419 Anxiety disorder, unspecified: Secondary | ICD-10-CM | POA: Diagnosis present

## 2014-12-26 LAB — BASIC METABOLIC PANEL
Anion gap: 11 (ref 5–15)
BUN: 17 mg/dL (ref 6–20)
CHLORIDE: 102 mmol/L (ref 101–111)
CO2: 22 mmol/L (ref 22–32)
CREATININE: 1.35 mg/dL — AB (ref 0.44–1.00)
Calcium: 8.6 mg/dL — ABNORMAL LOW (ref 8.9–10.3)
GFR calc Af Amer: 45 mL/min — ABNORMAL LOW (ref >60)
GFR calc non Af Amer: 39 mL/min — ABNORMAL LOW (ref >60)
GLUCOSE: 118 mg/dL — AB (ref 65–99)
POTASSIUM: 4.3 mmol/L (ref 3.5–5.1)
SODIUM: 135 mmol/L (ref 135–145)

## 2014-12-26 LAB — URINE CULTURE: Culture: NO GROWTH

## 2014-12-26 LAB — CBC
HCT: 29.6 % — ABNORMAL LOW (ref 36.0–46.0)
HEMOGLOBIN: 9.5 g/dL — AB (ref 12.0–15.0)
MCH: 29 pg (ref 26.0–34.0)
MCHC: 32.1 g/dL (ref 30.0–36.0)
MCV: 90.2 fL (ref 78.0–100.0)
Platelets: 296 10*3/uL (ref 150–400)
RBC: 3.28 MIL/uL — AB (ref 3.87–5.11)
RDW: 15.3 % (ref 11.5–15.5)
WBC: 8.1 10*3/uL (ref 4.0–10.5)

## 2014-12-26 MED ORDER — SODIUM CHLORIDE 0.9 % IV SOLN
INTRAVENOUS | Status: DC
  Administered 2014-12-26 (×2): via INTRAVENOUS

## 2014-12-26 MED ORDER — POLYETHYLENE GLYCOL 3350 17 G PO PACK
17.0000 g | PACK | Freq: Every day | ORAL | Status: DC
Start: 2014-12-26 — End: 2014-12-28
  Filled 2014-12-26: qty 1

## 2014-12-26 MED ORDER — ACETAMINOPHEN 500 MG PO TABS
1000.0000 mg | ORAL_TABLET | Freq: Three times a day (TID) | ORAL | Status: DC
  Administered 2014-12-26 – 2014-12-28 (×5): 1000 mg via ORAL
  Filled 2014-12-26 (×6): qty 2

## 2014-12-26 MED ORDER — OXYCODONE HCL 5 MG PO TABS
5.0000 mg | ORAL_TABLET | Freq: Four times a day (QID) | ORAL | Status: DC | PRN
  Administered 2014-12-26 – 2014-12-27 (×3): 10 mg via ORAL
  Filled 2014-12-26 (×4): qty 2

## 2014-12-26 MED ORDER — METHOCARBAMOL 500 MG PO TABS
500.0000 mg | ORAL_TABLET | Freq: Four times a day (QID) | ORAL | Status: DC | PRN
  Administered 2014-12-26 – 2014-12-28 (×8): 500 mg via ORAL
  Filled 2014-12-26 (×9): qty 1

## 2014-12-26 MED ORDER — LORAZEPAM 0.5 MG PO TABS
0.5000 mg | ORAL_TABLET | Freq: Four times a day (QID) | ORAL | Status: DC | PRN
  Administered 2014-12-26 – 2014-12-27 (×2): 0.5 mg via ORAL
  Filled 2014-12-26 (×2): qty 1

## 2014-12-26 MED ORDER — MORPHINE SULFATE (PF) 2 MG/ML IV SOLN
1.0000 mg | INTRAVENOUS | Status: DC | PRN
  Administered 2014-12-26 – 2014-12-28 (×12): 2 mg via INTRAVENOUS
  Filled 2014-12-26 (×12): qty 1

## 2014-12-26 NOTE — Evaluation (Signed)
Physical Therapy Evaluation Patient Details Name: [name] MRN: [id number] DOB: Jan 12, 1946 Today's Date: 12/26/2014   History of Present Illness  Pt admitted after fall with left femur fx s/p IM nail. Recent Left ankle ORIF 4weeks ago. PMHx: depression, dizziness, anxiety, OCD  Clinical Impression  [name] is limited by hip pain despite premedication, repositioning and ice with additional meds requested from nursing. Pt with decreased mobility, transfers and gait who requires assist for transfers and unable to tolerate gait at this time. Pt will benefit from acute therapy to maximize mobility, function, gait and independence to decrease burden of care with planned return to [facility] to continue her rehab.    Follow Up Recommendations SNF    Equipment Recommendations  None recommended by PT    Recommendations for Other Services       Precautions / Restrictions Precautions Precautions: Fall Required Braces or Orthoses: Other Brace/Splint Other Brace/Splint: CAM boot for LLE Restrictions Weight Bearing Restrictions: Yes LLE Weight Bearing: Weight bearing as tolerated      Mobility  Bed Mobility Overal bed mobility: Needs Assistance Bed Mobility: Supine to Sit     Supine to sit: Min guard     General bed mobility comments: cues for sequence with increased time  Transfers Overall transfer level: Needs assistance Equipment used: Rolling walker (2 wheeled) Transfers: Sit to/from [employer] Sit to Stand: Min assist Stand pivot transfers: Min assist       General transfer comment: cues for hand placement, sequence, RW positioning and safety with pivot to left from bed to [facility] then additional pivot to right to recliner. Pt required assist for pericare in standing  Ambulation/Gait                Stairs            Wheelchair Mobility    Modified Rankin (Stroke Patients Only)       Balance Overall balance assessment: Needs  assistance Sitting-balance support: Feet supported Sitting balance-Leahy Scale: Fair     Standing balance support: Bilateral upper extremity supported Standing balance-Leahy Scale: Poor Standing balance comment: RW for support                             Pertinent Vitals/Pain Pain Assessment: No/denies pain Pain Score: 8  Pain Location: left hip Pain Descriptors / Indicators: Aching;Guarding Pain Intervention(s): Limited activity within patient's tolerance;Monitored during session;Premedicated before session;Repositioned;Ice applied    Home Living Family/patient expects to be discharged to:: Skilled nursing facility                      Prior Function Level of Independence: Needs assistance   Gait / Transfers Assistance Needed: pt just started walking on left ankle a few days prior to admission  ADL's / Homemaking Assistance Needed: min assist all since ankle fx        Hand Dominance        Extremity/Trunk Assessment   Upper Extremity Assessment: Overall WFL for tasks assessed           Lower Extremity Assessment: LLE deficits/detail   LLE Deficits / Details: decreased strength secondary to pain  Cervical / Trunk Assessment: Normal  Communication   Communication: No difficulties  Cognition Arousal/Alertness: Awake/alert Behavior During Therapy: WFL for tasks assessed/performed Overall Cognitive Status: Within Functional Limits for tasks assessed  General Comments General comments (skin integrity, edema, etc.): Pt with incresed nausea, pain, and dizziness when performing sit to stand; RN notified    Exercises        Assessment/Plan    PT Assessment Patient needs continued PT services  PT Diagnosis Difficulty walking;Generalized weakness;Acute pain   PT Problem List Decreased strength;Decreased activity tolerance;Decreased balance;Pain;Decreased knowledge of use of DME;Decreased knowledge of  precautions;Decreased mobility  PT Treatment Interventions Gait training;DME instruction;Functional mobility training;Therapeutic activities;Therapeutic exercise;Patient/family education   PT Goals (Current goals can be found in the Care Plan section) Acute Rehab PT Goals Patient Stated Goal[facility] Goal Formulation: With patient Time For Goal Achievement: 01/02/15 Potential to Achieve Goals: Fair    Frequency Min 3X/week   Barriers to discharge Decreased caregiver support      Co-evaluation               End of Session Equipment Utilized During Treatment: Gait belt;Other (comment) (CAM boot left) Activity Tolerance: Patient tolerated treatment well Patient left: in chair;with call bell/phone within reach;with chair alarm set Nurse Communication: Mobility status;Precautions;Weight bearing status    Functional Assessment Tool Used: clinical judgement Functional Limitation: Mobility: Walking and moving around Mobility: Walking and Moving Around Current Status ([id number]([id number]): At least 1 percent but less than 20 percent impaired, limited or restricted Mobility: Walking and Moving Around Goal Status [phone]([id number]): At least 1 percent but less than 20 percent impaired, limited or restricted    Time: 0912-0923 PT Time Calculation (min) (ACUTE ONLY): 11 min   Charges:   PT Evaluation $Initial PT Evaluation Tier I: 1 Procedure     PT G Codes:   PT G-Codes **NOT FOR INPATIENT CLASS** Functional Assessment Tool Used: clinical judgement Functional Limitation: Mobility: Walking and moving around Mobility: Walking and Moving Around Current Status ([id number]([id number]): At least 1 percent but less than 20 percent impaired, limited or restricted Mobility: Walking and Moving Around Goal Status [phone](G8979): At least 1 percent but less than 20 percent impaired, limited or restricted    [name], [name] [name] 12/26/2014, 10:48 AM  Delaney MeigsMaija Tabor [name] [name], PT [phone]

## 2014-12-26 NOTE — Evaluation (Signed)
Occupational Therapy Evaluation Patient Details Name: [name] MRN: [id number] DOB: 1945/03/30 Today's Date: 12/26/2014    History of Present Illness Pt admitted after fall with left femur fx s/p IM nail. Recent Left ankle ORIF 4weeks ago. PMHx: depression, dizziness, anxiety, OCD   Clinical Impression   Pt reports she was independent with ADLs prior to fall 4 weeks ago. Pt is currently overall min assist for ADLs and mobility. Eval limited by pain, nausea, and dizziness; RN notified. Recommending pt return to SNF for further rehab prior to returning home in order to maximize independence and safety with ADLs and mobility. All further OT needs can be met at the next venue of care. Please re-consult if change in medical status occurs. Thank you for this referral.     Follow Up Recommendations  SNF;Supervision/Assistance - 24 hour    Equipment Recommendations  None recommended by OT (defer to next venue)    Recommendations for Other Services       Precautions / Restrictions Precautions Precautions: Fall Required Braces or Orthoses: Other Brace/Splint Other Brace/Splint: CAM boot for LLE Restrictions Weight Bearing Restrictions: Yes LLE Weight Bearing: Weight bearing as tolerated      Mobility Bed Mobility               General bed mobility comments: Pt OOB in chair  Transfers Overall transfer level: Needs assistance Equipment used: Rolling walker (2 wheeled) Transfers: Sit to/from Stand Sit to Stand: Min assist         General transfer comment: Min assist to boost up from chair. VC for technique and hand placement.    Balance Overall balance assessment: Needs assistance Sitting-balance support: Feet supported Sitting balance-Leahy Scale: Fair     Standing balance support: Bilateral upper extremity supported Standing balance-Leahy Scale: Poor Standing balance comment: RW for support                            ADL Overall ADL's : Needs  assistance/impaired Eating/Feeding: Set up;Sitting   Grooming: Set up;Sitting       Lower Body Bathing: Minimal assistance;Sit to/from stand       Lower Body Dressing: Minimal assistance;Sit to/from stand   Toilet Transfer: Minimal assistance;Stand-pivot;BSC;RW   Toileting- Clothing Manipulation and Hygiene: Minimal assistance;Sit to/from stand       Functional mobility during ADLs: Minimal assistance;Rolling walker General ADL Comments: No family present during OT eval. Session limited by pain, nausea, and dizziness upon standing. Unable to perform functional mobility at this time.      Vision     Perception     Praxis      Pertinent Vitals/Pain Pain Assessment: 0-10 Pain Score: 10-Worst pain ever Pain Location: L hip Pain Descriptors / Indicators: Aching;Grimacing;Guarding Pain Intervention(s): Limited activity within patient's tolerance;Monitored during session;Repositioned;Ice applied;Patient requesting pain meds-RN notified     Hand Dominance     Extremity/Trunk Assessment Upper Extremity Assessment Upper Extremity Assessment: Overall WFL for tasks assessed   Lower Extremity Assessment Lower Extremity Assessment: Defer to PT evaluation   Cervical / Trunk Assessment Cervical / Trunk Assessment: Normal   Communication Communication Communication: No difficulties   Cognition Arousal/Alertness: Awake/alert Behavior During Therapy: WFL for tasks assessed/performed Overall Cognitive Status: Within Functional Limits for tasks assessed                     General Comments       Exercises  Shoulder Instructions      Home Living Family/patient expects to be discharged to:: Skilled nursing facility                                        Prior Functioning/Environment Level of Independence: Needs assistance  Gait / Transfers Assistance Needed: pt just started walking on left ankle a few days prior to admission ADL's /  Homemaking Assistance Needed: min assist all since ankle fx        OT Diagnosis: Generalized weakness;Acute pain   OT Problem List:     OT Treatment/Interventions:      OT Goals(Current goals can be found in the care plan section) Acute Rehab OT Goals Patient Stated Goal: to go back to rehab OT Goal Formulation: With patient  OT Frequency:     Barriers to D/C:            Co-evaluation              End of Session Equipment Utilized During Treatment: Gait belt;Rolling walker;Other (comment) (CAM boot) Nurse Communication: Patient requests pain meds;Other (comment) (Pt with nausea and dizziness )  Activity Tolerance: Patient limited by pain;Other (comment) (Limited by nausea and dizziness ) Patient left: in chair;with call bell/phone within reach   Time: 1022-1035 OT Time Calculation (min): 13 min Charges:  OT General Charges $OT Visit: 1 Procedure OT Evaluation $Initial OT Evaluation Tier I: 1 Procedure G-Codes: OT G-codes **NOT FOR INPATIENT CLASS** Functional Assessment Tool Used: Clinical judgement Functional Limitation: Self care Self Care Current Status (T7322): At least 20 percent but less than 40 percent impaired, limited or restricted Self Care Goal Status (G2542): At least 20 percent but less than 40 percent impaired, limited or restricted Self Care Discharge Status [id number]): At least 20 percent but less than 40 percent impaired, limited or restricted   Binnie Kand M.S., OTR/L Pager: [phone]  12/26/2014, 10:43 AM

## 2014-12-26 NOTE — Progress Notes (Signed)
Utilization review completed.  

## 2014-12-26 NOTE — Progress Notes (Signed)
PATIENT DETAILS Name: [name] Age: 69 y.o. Sex: female Date of Birth: [birth year] Admit Date: 12/25/2014 Admitting Physician Alba Cory, MD PCP:[facility]  Subjective: Pain at the operative site.  Assessment/Plan: Active Problems: Fracture, intertrochanteric, left femur: Secondary to a mechanical fall. Underwent intramedullary nailing on 11/24. Doing well postoperatively. Continue supportive care-recommendations from orthopedics for weightbearing as tolerated, on Lovenox for DVT prophylaxis. Suspect will need SNF on discharge  ARF: Likely Pre-azotemia, gently hydrate and recheck in a.m.  Depression/anxiety: Continue Zoloft, and prn Ativan  History of sinus tachycardia: Stable, recently seen by cardiology. Continue metoprolol.  Chronic diastolic heart failure: Compensated.  Left Trimalleolar fracture with displacement of the ankle joint S/P ORIF in [place] approximately 4 weeks ago  Recent pneumonia with ARDS: Reviewed documentation from skilled nursing facility on 10/26 (Dr. Burnice Logan had PNA/ARDS requiring intubation post-operatively after getting Left ankle ORIF. CT chest on 11/24 shows chronic inflammatory changes in the upper lobes-afebrile/no leukocytosis-not toxic appearing-could be residual from recent pneumonia-monitor off antibiotics-stable for outpatient follow-up  Concern of possible left renal mass: Seen on CT chest-will need MRI at some point-suspect to be done as an outpatient  Disposition: Remain inpatient-SNF in next 1-2 days  Antimicrobial agents  See below  Anti-infectives    None      DVT Prophylaxis: Prophylactic Lovenox  Code Status: Full code  Family Communication None at bedside  Procedures: 11/24>> intramedullary nailing.  CONSULTS:  orthopedic surgery  Time spent 30 minutes-Greater than 50% of this time was spent in counseling, explanation of diagnosis, planning of  further management, and coordination of care.  MEDICATIONS: Scheduled Meds: . docusate sodium  100 mg Oral BID  . enoxaparin (LOVENOX) injection  40 mg Subcutaneous Q24H  . metoprolol tartrate  12.5 mg Oral BID  . pantoprazole  40 mg Oral QAC breakfast  . senna  1 tablet Oral BID  . sertraline  100 mg Oral Daily   Continuous Infusions: . sodium chloride 75 mL/hr at 12/26/14 0610   PRN Meds:.acetaminophen **OR** acetaminophen, bisacodyl, HYDROcodone-acetaminophen, ipratropium-albuterol, LORazepam, magnesium hydroxide, menthol-cetylpyridinium **OR** phenol, morphine injection, ondansetron **OR** ondansetron (ZOFRAN) IV    PHYSICAL EXAM: Vital signs in last 24 hours: Filed Vitals:   12/25/14 1900 12/25/14 1957 12/26/14 0003 12/26/14 0455  BP:  122/51 97/58 107/40  Pulse: 89  101 85  Temp: 98 F (36.7 C)  98.2 F (36.8 C) 98.7 F (37.1 C)  TempSrc:   Oral Oral  Resp: Height:      Weight:      SpO2: 100%  100% 99%    Weight change:  Filed Weights   12/25/14 1226  Weight: 51.256 kg (113 lb)   Body mass index is 20.66 kg/(m^2).   Gen Exam: Awake and alert with clear speech. Neck: Supple, No JVD.   Chest: B/L Clear.   CVS: S1 S2 Regular, no murmurs.  Abdomen: soft, BS +, non tender, non distended.  Extremities: no edema, lower extremities warm to touch. Neurologic: Non Focal.   Skin: No Rash.   Wounds: N/A.    Intake/Output from previous day:  Intake/Output Summary (Last 24 hours) at 12/26/14 0728 Last data filed at 12/26/14 0455  Gross per 24 hour  Intake    800 ml  Output    775 ml  Net     25 ml     LAB  RESULTS: CBC  Recent Labs Lab 12/25/14 1313 12/26/14 0400  WBC 12.3* 8.1  HGB 11.6* 9.5*  HCT 35.0* 29.6*  PLT 361 296  MCV 90.0 90.2  MCH 29.8 29.0  MCHC 33.1 32.1  RDW 15.1 15.3  LYMPHSABS 1.1  --   MONOABS 0.6  --   EOSABS 0.0  --   BASOSABS 0.0  --     Chemistries   Recent Labs Lab 12/25/14 1313 12/26/14 0400  NA 134*  135  K 5.1 4.3  CL 97* 102  CO2 25 22  GLUCOSE 141* 118*  BUN 16 17  CREATININE 1.27* 1.35*  CALCIUM 9.7 8.6*    CBG: No results for input(s): GLUCAP in the last 168 hours.  GFR Estimated Creatinine Clearance: 30.7 mL/min (by C-G formula based on Cr of 1.35).  Coagulation profile  Recent Labs Lab 12/25/14 1313  INR 1.10    Cardiac Enzymes No results for input(s): CKMB, TROPONINI, MYOGLOBIN in the last 168 hours.  Invalid input(s): CK  Invalid input(s): POCBNP No results for input(s): DDIMER in the last 72 hours. No results for input(s): HGBA1C in the last 72 hours. No results for input(s): CHOL, HDL, LDLCALC, TRIG, CHOLHDL, LDLDIRECT in the last 72 hours. No results for input(s): TSH, T4TOTAL, T3FREE, THYROIDAB in the last 72 hours.  Invalid input(s): FREET3 No results for input(s): VITAMINB12, FOLATE, FERRITIN, TIBC, IRON, RETICCTPCT in the last 72 hours. No results for input(s): LIPASE, AMYLASE in the last 72 hours.  Urine Studies No results for input(s): UHGB, CRYS in the last 72 hours.  Invalid input(s): UACOL, UAPR, USPG, UPH, UTP, UGL, UKET, UBIL, UNIT, UROB, ULEU, UEPI, UWBC, URBC, UBAC, CAST, UCOM, BILUA  MICROBIOLOGY: No results found for this or any previous visit (from the past 240 hour(s)).  RADIOLOGY STUDIES/RESULTS: Dg Chest 1 View  12/25/2014  CLINICAL DATA:  Fall today with left femoral fracture EXAM: CHEST  1 VIEW COMPARISON:  08/07/2014 FINDINGS: Cardiac shadow is within normal limits. Patient is significantly rotated to the left accentuating the mediastinal markings. Some mild spiculated density is noted in the right lung base which may be accentuated by the patient rotation. Frontal and lateral view of the chest is recommended when the patient's clinical condition improves to allow for optimum imaging. IMPRESSION: Changes in the right lung base as described above. Follow-up two view chest x-ray is recommended when patient's condition improves.  Electronically Signed   By: Alcide CleverMark  Lukens M.D.   On: 12/25/2014 13:21   Ct Chest Wo Contrast  12/25/2014  CLINICAL DATA:  Left hip fracture. Abnormal chest radiographs. Surgical clearance. Initial encounter. EXAM: CT CHEST WITHOUT CONTRAST TECHNIQUE: Multidetector CT imaging of the chest was performed following the standard protocol without IV contrast. COMPARISON:  Chest radiographs 12/25/2014, 08/07/2014 and 07/23/2013. FINDINGS: Mediastinum/Nodes: There are no enlarged mediastinal, hilar or axillary lymph nodes.Hilar assessment is mildly limited by the lack of intravenous contrast. There is a small hiatal hernia. The trachea and thyroid gland appear unremarkable. The heart size is normal. There is no pericardial effusion. Coronary artery calcifications are noted. Lungs/Pleura: There are moderate size dependent pleural effusions bilaterally. There is volume loss, bronchiectasis and peribronchial streaky densities in both upper lobes. There are a lesser linear densities in the lower lobes and right middle lobe. No suspicious pulmonary nodules are present at the lung bases. There are small subpleural nodules in both lower lobes, measuring 5 mm on the right (image number 18) and on the left (image 26). Upper abdomen:  The visualized liver appears unremarkable. There is mild extrahepatic biliary dilatation status post cholecystectomy. No adrenal mass. There is a complex appearing soft tissue mass projecting from the lower pole of the left kidney, measuring at least 3.2 cm transverse on image 61. This is incompletely visualized, but has a density of 36 HU. There are other smaller low-density and hyperdense left renal lesions. Musculoskeletal/Chest wall: There is no chest wall mass or suspicious osseous finding. No acute osseous findings. There is Schmorl's node formation in the superior endplate of the T8 vertebral body. IMPRESSION: 1. The radiographic densities appear inflammatory with predominately upper lobe  involvement. There is associated volume loss and bronchiectasis, suggesting chronic inflammation. This could be infectious, secondary to sarcoidosis or previous radiation therapy. Correlate clinically. 2. No findings suspicious for thoracic neoplasm. 3. Moderate size bilateral pleural effusions. 4. Concern of possible left renal mass, incompletely characterized without contrast. This requires further evaluation to exclude renal cell carcinoma. If the patient is able, this would be best accomplished with renal MRI to include pre and post-contrast imaging. Electronically Signed   By: Carey Bullocks M.D.   On: 12/25/2014 16:45   Dg Hip Operative Unilat With Pelvis Left  12/25/2014  CLINICAL DATA:  Left hip fracture. EXAM: OPERATIVE LEFT HIP (WITH PELVIS IF PERFORMED) 4 VIEWS TECHNIQUE: Fluoroscopic spot image(s) were submitted for interpretation post-operatively. COMPARISON:  None. FINDINGS: Screw and intramedullary rod fixation of intertrochanteric hip fracture is seen which is in near anatomic alignment. IMPRESSION: Internal fixation of left hip fracture in near anatomic alignment Electronically Signed   By: Myles Rosenthal M.D.   On: 12/25/2014 18:25   Dg Hip Unilat With Pelvis 2-3 Views Left  12/25/2014  CLINICAL DATA:  Left hip pain after falling today. Initial encounter. EXAM: DG HIP (WITH OR WITHOUT PELVIS) 2-3V LEFT COMPARISON:  None. FINDINGS: The bones are demineralized. There is a comminuted and angulated intertrochanteric left femur fracture. No associated dislocation. There are asymmetric degenerative changes of the left hip. IMPRESSION: Acute intertrochanteric left femur fracture with mild displacement and angulation. No dislocation. Electronically Signed   By: Carey Bullocks M.D.   On: 12/25/2014 13:18    Jeoffrey Massed, MD  [facility] Pager:[phone]  If 7PM-7AM, please contact night-coverage [email or web address] Password [email or web address] 12/26/2014, 7:28 AM   LOS: 1 day

## 2014-12-26 NOTE — Progress Notes (Signed)
   Subjective:  Patient reports pain as mild to moderate.  No c/o.  Objective:   VITALS:   Filed Vitals:   12/25/14 1957 12/26/14 0003 12/26/14 0455 12/26/14 0946  BP: 122/51 97/58 107/40 99/40  Pulse:  101 85 92  Temp:  98.2 F (36.8 C) 98.7 F (37.1 C)   TempSrc:  Oral Oral   Resp:  17 17   Height:      Weight:      SpO2:  100% 99%     ABD soft Sensation intact distally Intact pulses distally Dorsiflexion/Plantar flexion intact Incision: dressing C/D/I Compartment soft CAM boot to LLE  Lab Results  Component Value Date   WBC 8.1 12/26/2014   HGB 9.5* 12/26/2014   HCT 29.6* 12/26/2014   MCV 90.2 12/26/2014   PLT 296 12/26/2014   BMET    Component Value Date/Time   NA 135 12/26/2014 0400   NA 140 11/20/2014   K 4.3 12/26/2014 0400   CL 102 12/26/2014 0400   CO2 22 12/26/2014 0400   GLUCOSE 118* 12/26/2014 0400   BUN 17 12/26/2014 0400   BUN 25* 11/20/2014   CREATININE 1.35* 12/26/2014 0400   CREATININE 0.8 11/20/2014   CALCIUM 8.6* 12/26/2014 0400   GFRNONAA 39* 12/26/2014 0400   GFRAA 45* 12/26/2014 0400     Assessment/Plan: 1 Day Post-Op   Active Problems:   AKI (acute kidney injury) (HCC)   Left Trimalleolar fracture with displacement of the ankle joint S/P ORIF   Essential hypertension   Chronic depression   History of diastolic dysfunction   Closed left hip fracture (HCC)   History of sinus tachycardia   Anxiety   PNA (pneumonia)   Fracture, intertrochanteric, left femur (HCC)   WBAT with walker PT/OT DVT ppx: lovenox, SCDs, TEDs PO pain control D/C planning   Shelley Pooley, Cloyde ReamsBrian James 12/26/2014, 10:24 AM   Samson FredericBrian Miami Latulippe, MD Cell [phone]

## 2014-12-27 DIAGNOSIS — I10 Essential (primary) hypertension: Secondary | ICD-10-CM

## 2014-12-27 LAB — CBC
HEMATOCRIT: 25.1 % — AB (ref 36.0–46.0)
HEMOGLOBIN: 8.2 g/dL — AB (ref 12.0–15.0)
MCH: 29.5 pg (ref 26.0–34.0)
MCHC: 32.7 g/dL (ref 30.0–36.0)
MCV: 90.3 fL (ref 78.0–100.0)
Platelets: 223 10*3/uL (ref 150–400)
RBC: 2.78 MIL/uL — ABNORMAL LOW (ref 3.87–5.11)
RDW: 15.4 % (ref 11.5–15.5)
WBC: 6 10*3/uL (ref 4.0–10.5)

## 2014-12-27 LAB — BASIC METABOLIC PANEL
Anion gap: 7 (ref 5–15)
BUN: 10 mg/dL (ref 6–20)
CO2: 25 mmol/L (ref 22–32)
Calcium: 8.3 mg/dL — ABNORMAL LOW (ref 8.9–10.3)
Chloride: 103 mmol/L (ref 101–111)
Creatinine, Ser: 0.85 mg/dL (ref 0.44–1.00)
GFR calc Af Amer: >60 mL/min (ref >60)
GFR calc non Af Amer: >60 mL/min (ref >60)
Glucose, Bld: 114 mg/dL — ABNORMAL HIGH (ref 65–99)
Potassium: 3.7 mmol/L (ref 3.5–5.1)
Sodium: 135 mmol/L (ref 135–145)

## 2014-12-27 MED ORDER — PROMETHAZINE HCL 25 MG/ML IJ SOLN
12.5000 mg | Freq: Four times a day (QID) | INTRAMUSCULAR | Status: DC | PRN
  Administered 2014-12-27: 12.5 mg via INTRAVENOUS
  Filled 2014-12-27: qty 1

## 2014-12-27 MED ORDER — OXYCODONE HCL 5 MG PO TABS
10.0000 mg | ORAL_TABLET | Freq: Four times a day (QID) | ORAL | Status: DC | PRN
  Administered 2014-12-27 – 2014-12-28 (×4): 15 mg via ORAL
  Filled 2014-12-27 (×4): qty 3

## 2014-12-27 NOTE — Progress Notes (Addendum)
PATIENT DETAILS Name: [name] Age: 69 y.o. Sex: female Date of Birth: 04/03/1945 Admit Date: 12/25/2014 Admitting Physician Alba CoryBelkys A Regalado, MD [facility]CP:[facility]  Brief narrative:  69 year old female with history of depression/anxiety, sinus tachycardia on metoprolol who [place] a [place]-hospital course was complicated by development of VDRF secondary to PNA/ARDS-subsequently discharged to a local skilled nursing facility-who sustained a mechanical fall on the day of admission and presented to the ED-subsequent evaluation revealed a left hip fracture.   Subjective: Continues to have pain at the operative site.  Assessment/Plan: Active Problems: Fracture, intertrochanteric, left femur: Secondary to a mechanical fall. Underwent intramedullary nailing on 11/24. Doing well postoperatively. Continue supportive care-recommendations from orthopedics for weightbearing as tolerated, on Lovenox for DVT prophylaxis. Suspect will need SNF on discharge  ARF: Likely Pre-azotemia, resolved with IVF. Follow lytes periodically  Anemia:secondary to perioperative blood loss-recheck CBC in am and transfuse prn  Depression/anxiety: Continue Zoloft, and prn Ativan  History of sinus tachycardia: Stable, recently seen by cardiology. Continue metoprolol.  Chronic diastolic heart failure: Compensated.  Left Trimalleolar fracture with displacement of the ankle joint S/P ORIF in [place] approximately 4 weeks ago  Recent pneumonia with ARDS: Reviewed documentation from skilled nursing facility on 10/26 (Dr. Burnice LoganPandey)-patient had PNA/ARDS requiring intubation post-operatively after getting Left ankle ORIF. CT chest on 11/24 shows chronic inflammatory changes in the upper lobes-afebrile/no leukocytosis-not toxic appearing-could be residual from recent pneumonia-monitor off antibiotics-stable for outpatient  follow-up  Concern of possible left renal mass: Seen on CT chest-will need MRI at some point-can done as an outpatient  Disposition: Remain inpatient-SNF in next 1-2 days. Social work consulted  Antimicrobial agents  See below  Anti-infectives    None      DVT Prophylaxis: Prophylactic Lovenox  Code Status: Full code  Family Communication None at bedside  Procedures: 11/24>> intramedullary nailing.  CONSULTS:  orthopedic surgery  Time spent 25 minutes-Greater than 50% of this time was spent in counseling, explanation of diagnosis, planning of further management, and coordination of care.  MEDICATIONS: Scheduled Meds: . acetaminophen  1,000 mg Oral 3 times per day  . docusate sodium  100 mg Oral BID  . enoxaparin (LOVENOX) injection  40 mg Subcutaneous Q24H  . metoprolol tartrate  12.5 mg Oral BID  . pantoprazole  40 mg Oral QAC breakfast  . polyethylene glycol  17 g Oral Daily  . senna  1 tablet Oral BID  . sertraline  100 mg Oral Daily   Continuous Infusions:   PRN Meds:.bisacodyl, ipratropium-albuterol, LORazepam, magnesium hydroxide, menthol-cetylpyridinium **OR** phenol, methocarbamol, morphine injection, ondansetron **OR** ondansetron (ZOFRAN) IV, oxyCODONE, promethazine    PHYSICAL EXAM: Vital signs in last 24 hours: Filed Vitals:   12/26/14 1324 12/26/14 2049 12/27/14 0414 12/27/14 1337  BP: 118/51 134/58 132/50 124/46  Pulse: 84 101 84 86  Temp: 98.1 F (36.7 C) 99.4 F (37.4 C) 98.9 F (37.2 C) 97.8 F (36.6 C)  TempSrc: Oral Oral Oral Oral  Resp: 18 18 18 18   Height:      Weight:      SpO2: 100% 100% 100% 100%    Weight change:  Filed Weights   12/25/14 1226  Weight: 51.256 kg (113 lb)   Body mass index is 20.66 kg/(m^2).   Gen Exam: Awake and alert with clear speech. Neck: Supple, No JVD.   Chest: B/L  Clear.   CVS: S1 S2 Regular, no murmurs.  Abdomen: soft, BS +, non tender, non distended.  Extremities: no edema, lower  extremities warm to touch. Neurologic: Non Focal.   Skin: No Rash.   Wounds: N/A.    Intake/Output from previous day:  Intake/Output Summary (Last 24 hours) at 12/27/14 1428 Last data filed at 12/27/14 0526  Gross per 24 hour  Intake 1388.75 ml  Output      0 ml  Net 1388.75 ml     LAB RESULTS: CBC  Recent Labs Lab 12/25/14 1313 12/26/14 0400 12/27/14 0408  WBC 12.3* 8.1 6.0  HGB 11.6* 9.5* 8.2*  HCT 35.0* 29.6* 25.1*  PLT 361 296 223  MCV 90.0 90.2 90.3  MCH 29.8 29.0 29.5  MCHC 33.1 32.1 32.7  RDW 15.1 15.3 15.4  LYMPHSABS 1.1  --   --   MONOABS 0.6  --   --   EOSABS 0.0  --   --   BASOSABS 0.0  --   --     Chemistries   Recent Labs Lab 12/25/14 1313 12/26/14 0400 12/27/14 0408  NA 134* 135 135  K 5.1 4.3 3.7  CL 97* 102 103  CO2 GLUCOSE 141* 118* 114*  BUN CREATININE 1.27* 1.35* 0.85  CALCIUM 9.7 8.6* 8.3*    CBG: No results for input(s): GLUCAP in the last 168 hours.  GFR Estimated Creatinine Clearance: 48.7 mL/min (by C-G formula based on Cr of 0.85).  Coagulation profile  Recent Labs Lab 12/25/14 1313  INR 1.10    Cardiac Enzymes No results for input(s): CKMB, TROPONINI, MYOGLOBIN in the last 168 hours.  Invalid input(s): CK  Invalid input(s): POCBNP No results for input(s): DDIMER in the last 72 hours. No results for input(s): HGBA1C in the last 72 hours. No results for input(s): CHOL, HDL, LDLCALC, TRIG, CHOLHDL, LDLDIRECT in the last 72 hours. No results for input(s): TSH, T4TOTAL, T3FREE, THYROIDAB in the last 72 hours.  Invalid input(s): FREET3 No results for input(s): VITAMINB12, FOLATE, FERRITIN, TIBC, IRON, RETICCTPCT in the last 72 hours. No results for input(s): LIPASE, AMYLASE in the last 72 hours.  Urine Studies No results for input(s): UHGB, CRYS in the last 72 hours.  Invalid input(s): UACOL, UAPR, USPG, UPH, UTP, UGL, UKET, UBIL, UNIT, UROB, ULEU, UEPI, UWBC, URBC, UBAC, CAST, UCOM,  BILUA  MICROBIOLOGY: Recent Results (from the past 240 hour(s))  Urine culture     Status: None   Collection Time: 12/25/14  3:55 PM  Result Value Ref Range Status   Specimen Description URINE, CATHETERIZED  Final   Special Requests NONE  Final   Culture NO GROWTH 1 DAY  Final   Report Status 12/26/2014 FINAL  Final    RADIOLOGY STUDIES/RESULTS: Dg Chest 1 View  12/25/2014  CLINICAL DATA:  Fall today with left femoral fracture EXAM: CHEST  1 VIEW COMPARISON:  08/07/2014 FINDINGS: Cardiac shadow is within normal limits. Patient is significantly rotated to the left accentuating the mediastinal markings. Some mild spiculated density is noted in the right lung base which may be accentuated by the patient rotation. Frontal and lateral view of the chest is recommended when the patient's clinical condition improves to allow for optimum imaging. IMPRESSION: Changes in the right lung base as described above. Follow-up two view chest x-ray is recommended when patient's condition improves. Electronically Signed   By: Alcide Clever M.D.   On: 12/25/2014 13:21   Ct  Chest Wo Contrast  12/25/2014  CLINICAL DATA:  Left hip fracture. Abnormal chest radiographs. Surgical clearance. Initial encounter. EXAM: CT CHEST WITHOUT CONTRAST TECHNIQUE: Multidetector CT imaging of the chest was performed following the standard protocol without IV contrast. COMPARISON:  Chest radiographs 12/25/2014, 08/07/2014 and 07/23/2013. FINDINGS: Mediastinum/Nodes: There are no enlarged mediastinal, hilar or axillary lymph nodes.Hilar assessment is mildly limited by the lack of intravenous contrast. There is a small hiatal hernia. The trachea and thyroid gland appear unremarkable. The heart size is normal. There is no pericardial effusion. Coronary artery calcifications are noted. Lungs/Pleura: There are moderate size dependent pleural effusions bilaterally. There is volume loss, bronchiectasis and peribronchial streaky densities in both  upper lobes. There are a lesser linear densities in the lower lobes and right middle lobe. No suspicious pulmonary nodules are present at the lung bases. There are small subpleural nodules in both lower lobes, measuring 5 mm on the right (image number 18) and on the left (image 26). Upper abdomen: The visualized liver appears unremarkable. There is mild extrahepatic biliary dilatation status post cholecystectomy. No adrenal mass. There is a complex appearing soft tissue mass projecting from the lower pole of the left kidney, measuring at least 3.2 cm transverse on image 61. This is incompletely visualized, but has a density of 36 HU. There are other smaller low-density and hyperdense left renal lesions. Musculoskeletal/Chest wall: There is no chest wall mass or suspicious osseous finding. No acute osseous findings. There is Schmorl's node formation in the superior endplate of the T8 vertebral body. IMPRESSION: 1. The radiographic densities appear inflammatory with predominately upper lobe involvement. There is associated volume loss and bronchiectasis, suggesting chronic inflammation. This could be infectious, secondary to sarcoidosis or previous radiation therapy. Correlate clinically. 2. No findings suspicious for thoracic neoplasm. 3. Moderate size bilateral pleural effusions. 4. Concern of possible left renal mass, incompletely characterized without contrast. This requires further evaluation to exclude renal cell carcinoma. If the patient is able, this would be best accomplished with renal MRI to include pre and post-contrast imaging. Electronically Signed   By: Carey Bullocks M.D.   On: 12/25/2014 16:45   Dg Hip Operative Unilat With Pelvis Left  12/25/2014  CLINICAL DATA:  Left hip fracture. EXAM: OPERATIVE LEFT HIP (WITH PELVIS IF PERFORMED) 4 VIEWS TECHNIQUE: Fluoroscopic spot image(s) were submitted for interpretation post-operatively. COMPARISON:  None. FINDINGS: Screw and intramedullary rod fixation  of intertrochanteric hip fracture is seen which is in near anatomic alignment. IMPRESSION: Internal fixation of left hip fracture in near anatomic alignment Electronically Signed   By: Myles Rosenthal M.D.   On: 12/25/2014 18:25   Dg Hip Unilat With Pelvis 2-3 Views Left  12/25/2014  CLINICAL DATA:  Left hip pain after falling today. Initial encounter. EXAM: DG HIP (WITH OR WITHOUT PELVIS) 2-3V LEFT COMPARISON:  None. FINDINGS: The bones are demineralized. There is a comminuted and angulated intertrochanteric left femur fracture. No associated dislocation. There are asymmetric degenerative changes of the left hip. IMPRESSION: Acute intertrochanteric left femur fracture with mild displacement and angulation. No dislocation. Electronically Signed   By: Carey Bullocks M.D.   On: 12/25/2014 13:18    Jeoffrey Massed, MD  [facility] Pager:[phone]  If 7PM-7AM, please contact night-coverage [email or web address] Password [email or web address] 12/27/2014, 2:28 PM   LOS: 2 days

## 2014-12-27 NOTE — Progress Notes (Signed)
    Subjective: 2 Days Post-Op Procedure(s) (LRB): INTRAMEDULLARY (IM) NAIL INTERTROCHANTRIC (Left) Patient reports pain as 6 on 0-10 scale.   Denies CP or SOB.  Voiding without difficulty. Positive flatus. Objective: Vital signs in last 24 hours: Temp:  [98.1 F (36.7 C)-99.4 F (37.4 C)] 98.9 F (37.2 C) (11/26 0414) Pulse Rate:  [84-101] 84 (11/26 0414) Resp:  [18] 18 (11/26 0414) BP: (99-134)/(40-58) 132/50 mmHg (11/26 0414) SpO2:  [100 %] 100 % (11/26 0414)  Intake/Output from previous day: 11/25 0701 - 11/26 0700 In: 1748.8 [P.O.:360; I.V.:1388.8] Out: -  Intake/Output this shift:    Labs:  Recent Labs  12/25/14 1313 12/26/14 0400 12/27/14 0408  HGB 11.6* 9.5* 8.2*    Recent Labs  12/26/14 0400 12/27/14 0408  WBC 8.1 6.0  RBC 3.28* 2.78*  HCT 29.6* 25.1*  PLT 296 223    Recent Labs  12/26/14 0400 12/27/14 0408  NA 135 135  K 4.3 3.7  CL 102 103  CO2 22 25  BUN 17 10  CREATININE 1.35* 0.85  GLUCOSE 118* 114*  CALCIUM 8.6* 8.3*    Recent Labs  12/25/14 1313  INR 1.10    Physical Exam: Neurologically intact Intact pulses distally Incision: dressing C/D/I Compartment soft  Assessment/Plan: 2 Days Post-Op Procedure(s) (LRB): INTRAMEDULLARY (IM) NAIL INTERTROCHANTRIC (Left) Advance diet Up with therapy  Kerrie Latour D for Dr. Venita Lickahari Kanetra Ho [facility] [phone] 12/27/2014, 8:33 AM

## 2014-12-27 NOTE — Progress Notes (Signed)
Physical Therapy Treatment Patient Details Name: Madison AbtsJudy L [name] MRN: [id number] DOB: 07/07/1945 Today's Date: 12/27/2014    History of Present Illness Pt admitted after fall with left femur fx s/p IM nail. Recent Left ankle ORIF 4weeks ago. PMHx: depression, dizziness, anxiety, OCD    PT Comments    Pt progressing with mobility but limited by anxiety throughout. Pt with cues for slow deep breathing throughout, encouragement provided to maximize function. Pt premedicated for nausea, anxiety and pain, able to talk in complete sentences. Pt encouraged to continue HEP and mobility with nursing. Will follow to maximize function.   Follow Up Recommendations  SNF     Equipment Recommendations       Recommendations for Other Services       Precautions / Restrictions Precautions Precautions: Fall Required Braces or Orthoses: Other Brace/Splint Other Brace/Splint: CAM boot for LLE Restrictions Weight Bearing Restrictions: Yes LLE Weight Bearing: Weight bearing as tolerated    Mobility  Bed Mobility Overal bed mobility: Needs Assistance Bed Mobility: Supine to Sit     Supine to sit: Min assist     General bed mobility comments: cues for sequence with assist for LLE to move to EOB and elevate trunk, pt denied attempting more on her own with transfer today  Transfers Overall transfer level: Needs assistance Equipment used: Rolling walker (2 wheeled) Transfers: Sit to/from [employer]Stand;Stand Pivot Transfers Sit to Stand: Min guard Stand pivot transfers: Min assist       General transfer comment: cues for hand placement, posture, sequence and safety. [facility] then stood to take steps and sat in recliner. Sit to stand x 3  Ambulation/Gait Ambulation/Gait assistance: Min guard Ambulation Distance (Feet): 5 Feet Assistive device: Rolling walker (2 wheeled) Gait Pattern/deviations: Step-to pattern   Gait velocity interpretation: Below normal speed for age/gender General  Gait Details: max cues for posture, position in RW and encouragement to increase distance. pt walked 2', sat then walked 5' with chair to follow due to anxiety and fatigue   Stairs            Wheelchair Mobility    Modified Rankin (Stroke Patients Only)       Balance Overall balance assessment: Needs assistance   Sitting balance-Leahy Scale: Good       Standing balance-Leahy Scale: Poor                      Cognition Arousal/Alertness: Awake/alert Behavior During Therapy: Anxious Overall Cognitive Status: Within Functional Limits for tasks assessed                      Exercises General Exercises - Lower Extremity Long Arc Quad: AROM;Seated;Left;10 reps Hip ABduction/ADduction: AAROM;Seated;Left;10 reps Hip Flexion/Marching: AAROM;Seated;Left;10 reps    General Comments        Pertinent Vitals/Pain Pain Score: 10-Worst pain ever Pain Location: left hip Pain Descriptors / Indicators: Aching;Guarding Pain Intervention(s): Premedicated before session;Repositioned;Monitored during session;Limited activity within patient's tolerance    Home Living                      Prior Function            PT Goals (current goals can now be found in the care plan section) Progress towards PT goals: Progressing toward goals    Frequency       PT Plan Current plan remains appropriate    Co-evaluation  End of Session Equipment Utilized During Treatment: Gait belt Activity Tolerance: Patient limited by fatigue;Patient limited by pain Patient left: in chair;with call bell/phone within reach;with chair alarm set     Time: [phone] PT Time Calculation (min) (ACUTE ONLY): 24 min  Charges:                       G Codes:      Delorse Lek January 08, 2015, 8:34 AM Delaney Meigs, PT [phone]

## 2014-12-28 LAB — CBC
HEMATOCRIT: 26.3 % — AB (ref 36.0–46.0)
HEMOGLOBIN: 8.5 g/dL — AB (ref 12.0–15.0)
MCH: 29.4 pg (ref 26.0–34.0)
MCHC: 32.3 g/dL (ref 30.0–36.0)
MCV: 91 fL (ref 78.0–100.0)
Platelets: 243 10*3/uL (ref 150–400)
RBC: 2.89 MIL/uL — AB (ref 3.87–5.11)
RDW: 15.4 % (ref 11.5–15.5)
WBC: 7.1 10*3/uL (ref 4.0–10.5)

## 2014-12-28 MED ORDER — ENOXAPARIN SODIUM 40 MG/0.4ML ~~LOC~~ SOLN: 40.0000 mg | SUBCUTANEOUS | Status: DC

## 2014-12-28 MED ORDER — OXYCODONE HCL 5 MG PO TABS: 5.0000 mg | ORAL_TABLET | Freq: Four times a day (QID) | ORAL | Status: DC | PRN

## 2014-12-28 MED ORDER — LORAZEPAM 0.5 MG PO TABS: 0.5000 mg | ORAL_TABLET | Freq: Four times a day (QID) | ORAL | Status: DC | PRN

## 2014-12-28 MED ORDER — DOCUSATE SODIUM 100 MG PO CAPS: 100.0000 mg | ORAL_CAPSULE | Freq: Two times a day (BID) | ORAL | Status: DC

## 2014-12-28 MED ORDER — BISACODYL 10 MG RE SUPP: 10.0000 mg | Freq: Every day | RECTAL | Status: DC | PRN

## 2014-12-28 NOTE — Progress Notes (Signed)
Facility has accepted the Pt for return.  MD to sign FL2.  SW to arrange for transportation.  Family to be notified.  Pt to be d/c'd.  Providence CrosbyAmanda Rachella Basden, LCSW Clinical Social Work [phone]

## 2014-12-28 NOTE — Progress Notes (Signed)
Subjective: 3 Days Post-Op Procedure(s) (LRB): INTRAMEDULLARY (IM) NAIL INTERTROCHANTRIC (Left) Patient reports pain as mild to left hip.  Reports a good night. Tolerating PO's. Progressing with PT. Denies SOB, CP, or calf pain.  Objective: Vital signs in last 24 hours: Temp:  [97.8 F (36.6 C)-99.1 F (37.3 C)] 99.1 F (37.3 C) (11/27 0511) Pulse Rate:  [86-96] 96 (11/27 0511) Resp:  [18] 18 (11/27 0511) BP: (122-138)/(45-47) 122/45 mmHg (11/27 0511) SpO2:  [97 %-100 %] 97 % (11/27 0511)  Intake/Output from previous day: 11/26 0701 - 11/27 0700 In: 840 [P.O.:840] Out: -  Intake/Output this shift:     Recent Labs  12/25/14 1313 12/26/14 0400 12/27/14 0408 12/28/14 0345  HGB 11.6* 9.5* 8.2* 8.5*    Recent Labs  12/27/14 0408 12/28/14 0345  WBC 6.0 7.1  RBC 2.78* 2.89*  HCT 25.1* 26.3*  PLT 223 243    Recent Labs  12/26/14 0400 12/27/14 0408  NA 135 135  K 4.3 3.7  CL 102 103  CO2 22 25  BUN 17 10  CREATININE 1.35* 0.85  GLUCOSE 118* 114*  CALCIUM 8.6* 8.3*    Recent Labs  12/25/14 1313  INR 1.10    Alert and oriented x3. RRR, Lungs clear, BS x4. Left Calf soft and non tender. L hip dressing C/D/I. No DVT signs. No signs of infection or compartment syndrome. LLE grossly neurovascularly intact. Cam boot in place.    Assessment/Plan: 3 Days Post-Op Procedure(s) (LRB): INTRAMEDULLARY (IM) NAIL INTERTROCHANTRIC (Left) WBAT with walker D/C planning, possible SNF On Lovenox Up with PT Medicine following  S/p Left ankle ORIF: F/u in [place] continue cam boot  Ciarrah Rae L 12/28/2014, 10:07 AM

## 2014-12-28 NOTE — Discharge Summary (Signed)
[name], is a 69 y.o. female  DOB [birth year]  MRN [id number].  Admission date:  12/25/2014  Admitting Physician  Alba Cory, MD  Discharge Date:  12/28/2014   Primary MD  [facility]  Recommendations for primary care physician for things to follow:   Outpatient orthopedic follow-up, fall precautions, check CBC BMP in 5-7 days.  She will require repeat noncontrast CT chest in one month along with MRI of her kidneys in 1 month. Kindly see report below for details.    Admission Diagnosis  Pain [R52] Abnormal chest x-ray [R93.8] Fracture, intertrochanteric, left femur, closed, initial encounter [facility]) [S72.142A]   Discharge Diagnosis  Pain [R52] Abnormal chest x-ray [R93.8] Fracture, intertrochanteric, left femur, closed, initial encounter (HCC) [S72.142A]     Active Problems:   AKI (acute kidney injury) (HCC)   Left Trimalleolar fracture with displacement of the ankle joint S/P ORIF   Essential hypertension   Chronic depression   History of diastolic dysfunction   Closed left hip fracture (HCC)   History of sinus tachycardia   Anxiety   PNA (pneumonia)   Fracture, intertrochanteric, left femur (HCC)      Past Medical History  Diagnosis Date  . Chicken pox   . Chest pain   . SOB (shortness of breath) on exertion   . Fall 06/15/2011    Caused discomfort in lower left rib and left lateral hip area  . H/O: hysterectomy 1985  . Depressive disorder   . Pre-syncope   . Dizziness   . OCD (obsessive compulsive disorder)   . Anxiety   . Opioid abuse   . Hypokalemia   . Severe sepsis (HCC)   . Anemia   . Azotemia   . Left trimalleolar fracture   . Respiratory failure (HCC)   . History of MRSA infection   . Leukocytosis     Past Surgical History  Procedure  Laterality Date  . Gallbladder surgery    . Appendectomy    . Total abdominal hysterectomy         HPI  from the history and physical [name] on the day of admission:    69 year old female with history of depression/anxiety, sinus tachycardia on metoprolol who was recently hospitalized in [place] following a left ankle fracture-hospital course was complicated by development of VDRF secondary to PNA/ARDS-subsequently discharged to a local skilled nursing facility-who sustained a mechanical fall on the day of admission and presented to the ED-subsequent evaluation revealed a left hip fracture.      Hospital Course:   Fracture, intertrochanteric, left femur: Secondary to a mechanical fall. Underwent intramedullary nailing on 11/24. Doing well postoperatively. Continue supportive care-recommendations from orthopedics for weightbearing as tolerated, on Lovenox for DVT prophylaxis. She will be discharged to SNF with close outpatient orthopedics follow-up. Stop Lovenox once requested to do so by orthopedics.  ARF: Likely Pre-azotemia, resolved with IVF. BMP in 5-7 days.  Anemia: secondary to perioperative blood, no transfusion required, repeat CBC in 5-7 days.  Depression/anxiety: Continue Zoloft, and prn Ativan  History of sinus tachycardia: Stable, recently seen by cardiology. Continue metoprolol.  Chronic diastolic heart failure: Compensated.  Left Trimalleolar fracture with displacement of the ankle joint S/P ORIF in [place] approximately 4 weeks ago  Recent pneumonia with ARDS: Reviewed documentation from skilled nursing facility on 10/26 (Dr. Burnice Logan had PNA/ARDS requiring intubation post-operatively after getting Left ankle ORIF. CT chest on 11/24 shows chronic inflammatory changes in the upper lobes-afebrile/no leukocytosis-not toxic appearing-could be residual from recent pneumonia-monitor off antibiotics-stable for outpatient follow-up  Concern of possible left renal mass:  Seen on CT chest-will need MRI at some point-can [name] as an outpatient. Daughter informed bedside.        Discharge Condition: Fair Follow UP  Follow-up Information    Follow up with HEWITT, Jonny Ruiz, MD. Schedule an appointment as soon as possible for a visit in 2 weeks.   Specialty:  Orthopedic Surgery   Contact information:   [address] [place] [address] [phone]       Follow up with [facility] Schedule an appointment as soon as possible for a visit in 1 week.   Contact information:   [address] [place] [address] [phone]        Consults obtained - Ortho  Diet and Activity recommendation: See Discharge Instructions below  Discharge Instructions       Discharge Instructions    Diet - low sodium heart healthy    Complete by:  As directed      Discharge instructions    Complete by:  As directed   Activity-You may bear weight on your left leg as tolerated in the CAM walker boot.  You may shower 3 days after your hip surgery.  Place a bandaid or dry dressing over your incisions daily until the staples are removed.  Follow with Primary MD [facility] in 7 days   Get CBC, CMP, 2 view Chest X ray checked  by Primary MD next visit.   Disposition SNF  Diet: Heart Healthy   with feeding assistance and aspiration precautions.  For Heart failure patients - Check your Weight same time everyday, if you gain over 2 pounds, or you develop in leg swelling, experience more shortness of breath or chest pain, call your Primary MD immediately. Follow Cardiac Low Salt Diet and 1.5 lit/day fluid restriction.   On your next visit with your primary care physician please Get Medicines reviewed and adjusted.   Please request your Prim.MD to go over all Hospital Tests and Procedure/Radiological results at the follow up, please get all Hospital records sent to your Prim MD by signing hospital release  before you go home.   If you experience worsening of your admission symptoms, develop shortness of breath, life threatening emergency, suicidal or homicidal thoughts you must seek medical attention immediately by calling 911 or calling your MD immediately  if symptoms less severe.  You Must read complete instructions/literature along with all the possible adverse reactions/side effects for all the Medicines you take and that have been prescribed to you. Take any new Medicines after you have completely understood and accpet all the possible adverse reactions/side effects.   Do not drive, operating heavy machinery, perform activities at heights, swimming or participation in water activities or provide baby sitting services if your were admitted for syncope or siezures until you have seen by Primary MD or a Neurologist and advised to do so  again.  Do not drive when taking Pain medications.    Do not take more than prescribed Pain, Sleep and Anxiety Medications  Special Instructions: If you have smoked or chewed Tobacco  in the last 2 yrs please stop smoking, stop any regular Alcohol  and or any Recreational drug use.  Wear Seat belts while driving.   Please note  You were cared for by a hospitalist during your hospital stay. If you have any questions about your discharge medications or the care you received while you were in the hospital after you are discharged, you can call the unit and asked to speak with the hospitalist on call if the hospitalist that took care of you is not available. Once you are discharged, your primary care physician will handle any further medical issues. Please note that NO REFILLS for any discharge medications will be authorized once you are discharged, as it is imperative that you return to your primary care physician (or establish a relationship with a primary care physician if you do not have one) for your aftercare needs so that they can reassess your need for  medications and monitor your lab values.     Increase activity slowly    Complete by:  As directed              Discharge Medications       Medication List    STOP taking these medications        heparin 5000 UNIT/ML injection      TAKE these medications        acetaminophen 500 MG tablet  Commonly known as:  TYLENOL  Take one tablet by mouth every 6 hours as needed for mild pain     bisacodyl 10 MG suppository  Commonly known as:  DULCOLAX  Place 1 suppository (10 mg total) rectally daily as needed for moderate constipation.     docusate sodium 100 MG capsule  Commonly known as:  COLACE  Take 1 capsule (100 mg total) by mouth 2 (two) times daily.     enoxaparin 40 MG/0.4ML injection  Commonly known as:  LOVENOX  Inject 0.4 mLs (40 mg total) into the skin daily.     guaiFENesin-dextromethorphan 100-10 MG/5ML syrup  Commonly known as:  ROBITUSSIN DM  10ml by mouth every 4 hours as needed for cough     ipratropium-albuterol 0.5-2.5 (3) MG/3ML Soln  Commonly known as:  DUONEB  Take 3 mLs by nebulization every 6 (six) hours as needed.     LORazepam 0.5 MG tablet  Commonly known as:  ATIVAN  Take 1 tablet (0.5 mg total) by mouth every 6 (six) hours as needed for anxiety.     metoprolol tartrate 25 MG tablet  Commonly known as:  LOPRESSOR  Take 12.5 mg by mouth 2 (two) times daily.     oxyCODONE 5 MG immediate release tablet  Commonly known as:  Oxy IR/ROXICODONE  Take 1 tablet (5 mg total) by mouth every 6 (six) hours as needed for severe pain. Take one to two tablets by mouth every 4 hours as needed for moderate to severe pain     pantoprazole 40 MG tablet  Commonly known as:  PROTONIX  Take 40 mg by mouth daily. Before breakfast     PROCEL PO  Two scoops by mouth twice daily     sertraline 100 MG tablet  Commonly known as:  ZOLOFT  Take 100 mg by mouth daily.  Major procedures and Radiology Reports - PLEASE review detailed and final reports  for all details, in brief -       Dg Chest 1 View  12/25/2014  CLINICAL DATA:  Fall today with left femoral fracture EXAM: CHEST  1 VIEW COMPARISON:  08/07/2014 FINDINGS: Cardiac shadow is within normal limits. Patient is significantly rotated to the left accentuating the mediastinal markings. Some mild spiculated density is noted in the right lung base which may be accentuated by the patient rotation. Frontal and lateral view of the chest is recommended when the patient's clinical condition improves to allow for optimum imaging. IMPRESSION: Changes in the right lung base as described above. Follow-up two view chest x-ray is recommended when patient's condition improves. Electronically Signed   By: Alcide CleverMark  Lukens M.D.   On: 12/25/2014 13:21   Ct Chest Wo Contrast  12/25/2014  CLINICAL DATA:  Left hip fracture. Abnormal chest radiographs. Surgical clearance. Initial encounter. EXAM: CT CHEST WITHOUT CONTRAST TECHNIQUE: Multidetector CT imaging of the chest was performed following the standard protocol without IV contrast. COMPARISON:  Chest radiographs 12/25/2014, 08/07/2014 and 07/23/2013. FINDINGS: Mediastinum/Nodes: There are no enlarged mediastinal, hilar or axillary lymph nodes.Hilar assessment is mildly limited by the lack of intravenous contrast. There is a small hiatal hernia. The trachea and thyroid gland appear unremarkable. The heart size is normal. There is no pericardial effusion. Coronary artery calcifications are noted. Lungs/Pleura: There are moderate size dependent pleural effusions bilaterally. There is volume loss, bronchiectasis and peribronchial streaky densities in both upper lobes. There are a lesser linear densities in the lower lobes and right middle lobe. No suspicious pulmonary nodules are present at the lung bases. There are small subpleural nodules in both lower lobes, measuring 5 mm on the right (image number 18) and on the left (image 26). Upper abdomen: The visualized liver  appears unremarkable. There is mild extrahepatic biliary dilatation status post cholecystectomy. No adrenal mass. There is a complex appearing soft tissue mass projecting from the lower pole of the left kidney, measuring at least 3.2 cm transverse on image 61. This is incompletely visualized, but has a density of 36 HU. There are other smaller low-density and hyperdense left renal lesions. Musculoskeletal/Chest wall: There is no chest wall mass or suspicious osseous finding. No acute osseous findings. There is Schmorl's node formation in the superior endplate of the T8 vertebral body. IMPRESSION: 1. The radiographic densities appear inflammatory with predominately upper lobe involvement. There is associated volume loss and bronchiectasis, suggesting chronic inflammation. This could be infectious, secondary to sarcoidosis or previous radiation therapy. Correlate clinically. 2. No findings suspicious for thoracic neoplasm. 3. Moderate size bilateral pleural effusions. 4. Concern of possible left renal mass, incompletely characterized without contrast. This requires further evaluation to exclude renal cell carcinoma. If the patient is able, this would be best accomplished with renal MRI to include pre and post-contrast imaging. Electronically Signed   By: Carey BullocksWilliam  Veazey M.D.   On: 12/25/2014 16:45   Dg Hip Operative Unilat With Pelvis Left  12/25/2014  CLINICAL DATA:  Left hip fracture. EXAM: OPERATIVE LEFT HIP (WITH PELVIS IF PERFORMED) 4 VIEWS TECHNIQUE: Fluoroscopic spot image(s) were submitted for interpretation post-operatively. COMPARISON:  None. FINDINGS: Screw and intramedullary rod fixation of intertrochanteric hip fracture is seen which is in near anatomic alignment. IMPRESSION: Internal fixation of left hip fracture in near anatomic alignment Electronically Signed   By: Myles RosenthalJohn  Stahl M.D.   On: 12/25/2014 18:25   Dg Hip Unilat With Pelvis  2-3 Views Left  12/25/2014  CLINICAL DATA:  Left hip pain after  falling today. Initial encounter. EXAM: DG HIP (WITH OR WITHOUT PELVIS) 2-3V LEFT COMPARISON:  None. FINDINGS: The bones are demineralized. There is a comminuted and angulated intertrochanteric left femur fracture. No associated dislocation. There are asymmetric degenerative changes of the left hip. IMPRESSION: Acute intertrochanteric left femur fracture with mild displacement and angulation. No dislocation. Electronically Signed   By: Carey Bullocks M.D.   On: 12/25/2014 13:18    Micro Results      Recent Results (from the past 240 hour(s))  Urine culture     Status: None   Collection Time: 12/25/14  3:55 PM  Result Value Ref Range Status   Specimen Description URINE, CATHETERIZED  Final   Special Requests NONE  Final   Culture NO GROWTH 1 DAY  Final   Report Status 12/26/2014 FINAL  Final       Today   Subjective    [name] today has no headache,no chest abdominal pain,no new weakness tingling or numbness, feels much better.   Objective   Blood pressure 122/45, pulse 96, temperature 99.1 F (37.3 C), temperature source Oral, resp. rate 18, height 5\' 2"  (1.575 m), weight 51.256 kg (113 lb), SpO2 97 %.   Intake/Output Summary (Last 24 hours) at 12/28/14 1024 Last data filed at 12/27/14 1950  Gross per 24 hour  Intake    600 ml  Output      0 ml  Net    600 ml    Exam Awake Alert, Oriented x 3, No new F.N deficits, Normal affect [place].AT,PERRAL Supple Neck,No JVD, No cervical lymphadenopathy appriciated.  Symmetrical Chest wall movement, Good air movement bilaterally, CTAB RRR,No Gallops,Rubs or new Murmurs, No Parasternal Heave +ve B.Sounds, Abd Soft, Non tender, No organomegaly appriciated, No rebound -guarding or rigidity. No Cyanosis, Clubbing or edema, No new Rash or bruise. Left ankle in cam boot   Data Review   CBC w Diff: Lab Results  Component Value Date   WBC 7.1 12/28/2014   WBC 11.8 11/20/2014   HGB 8.5* 12/28/2014   HCT 26.3* 12/28/2014   HCT  34.2 08/08/2014   PLT 243 12/28/2014   LYMPHOPCT 9 12/25/2014   MONOPCT 5 12/25/2014   EOSPCT 0 12/25/2014   BASOPCT 0 12/25/2014    CMP: Lab Results  Component Value Date   NA 135 12/27/2014   NA 140 11/20/2014   K 3.7 12/27/2014   CL 103 12/27/2014   CO2 25 12/27/2014   BUN 10 12/27/2014   BUN 25* 11/20/2014   CREATININE 0.85 12/27/2014   CREATININE 0.8 11/20/2014   GLU 103 11/20/2014   PROT 7.1 12/25/2014   ALBUMIN 3.5 12/25/2014   BILITOT 0.8 12/25/2014   ALKPHOS 120 12/25/2014   AST 28 12/25/2014   ALT 23 12/25/2014  .   Total Time in preparing paper work, data evaluation and todays exam - 35 minutes  Leroy Sea M.D on 12/28/2014 at 10:24 AM  [facility]   Office  [phone]

## 2014-12-28 NOTE — NC FL2 (Signed)
[place] MEDICAID FL2 LEVEL OF CARE SCREENING TOOL     IDENTIFICATION  Patient Name: Melbourne AbtsJudy L Tyree Birthdate: 01/02/1946 Sex: female Admission Date (Current Location): 12/25/2014  [facility] [place]   Facility and Address:  [facility], [address], [place], [place] [address]      Provider Number: [id number]  Attending Physician Name and Address:  Leroy SeaPrashant K Singh, MD  Relative Name and Phone Number:       Current Level of Care: SNF Recommended Level of Care: Skilled Nursing Facility Prior Approval Number:    Date Approved/Denied:   PASRR Number:    Discharge Plan: SNF    Current Diagnoses: Patient Active Problem List   Diagnosis Date Noted  . History of diastolic dysfunction 12/25/2014  . Closed left hip fracture (HCC) 12/25/2014  . History of sinus tachycardia 12/25/2014  . Anxiety 12/25/2014  . PNA (pneumonia) 12/25/2014  . Fracture, intertrochanteric, left femur (HCC) 12/25/2014  . Abnormal chest x-ray   . Sinus tachycardia (HCC) 12/16/2014  . Right ventricular hypertrophy 12/16/2014  . Left Trimalleolar fracture with displacement of the ankle joint S/P ORIF 11/26/2014  . Essential hypertension 11/26/2014  . Encounter for central line care 11/26/2014  . HCAP (healthcare-associated pneumonia) 11/26/2014  . Leukocytosis 11/26/2014  . Chronic depression 11/26/2014  . Esophageal reflux 11/26/2014  . Protein-calorie malnutrition (HCC) 11/26/2014  . Chest pain 08/07/2014  . Anemia 08/07/2014  . AKI (acute kidney injury) (HCC) 08/07/2014  . Hyperglycemia 08/07/2014  . Hyponatremia 08/07/2014  . Hypoxia 05/18/2014  . Angina decubitus (HCC) 07/20/2011  . Palpitations 07/20/2011  . Hypercholesterolemia 07/20/2011  . Family history of coronary artery disease 07/20/2011    Orientation ACTIVITIES/SOCIAL BLADDER RESPIRATION    Self, Time, Situation, Place  Active Continent Normal  BEHAVIORAL SYMPTOMS/MOOD  NEUROLOGICAL BOWEL NUTRITION STATUS      Continent  (regular)  PHYSICIAN VISITS COMMUNICATION OF NEEDS Height & Weight Skin    Verbally   113 lbs. Surgical wounds          AMBULATORY STATUS RESPIRATION    Assist extensive Normal      Personal Care Assistance Level of Assistance  Bathing, Dressing Bathing Assistance: Limited assistance   Dressing Assistance: Limited assistance      Functional Limitations Info                SPECIAL CARE FACTORS FREQUENCY                      Additional Factors Info                  Current Medications (12/28/2014): Current Facility-Administered Medications  Medication Dose Route Frequency Provider Last Rate Last Dose  . acetaminophen (TYLENOL) tablet 1,000 mg  1,000 mg Oral 3 times per day Maretta BeesShanker M Ghimire, MD   1,000 mg at 12/28/14 0630  . bisacodyl (DULCOLAX) suppository 10 mg  10 mg Rectal Daily PRN Meredith PelPaula M Guenther, NP      . docusate sodium (COLACE) capsule 100 mg  100 mg Oral BID Toni ArthursJohn Hewitt, MD   100 mg at 12/26/14 2145  . enoxaparin (LOVENOX) injection 40 mg  40 mg Subcutaneous Q24H Toni ArthursJohn Hewitt, MD   40 mg at 12/28/14 0912  . ipratropium-albuterol (DUONEB) 0.5-2.5 (3) MG/3ML nebulizer solution 3 mL  3 mL Nebulization Q4H PRN Meredith PelPaula M Guenther, NP      . LORazepam (ATIVAN) tablet 0.5 mg  0.5 mg Oral Q6H  PRN Maretta Bees, MD   0.5 mg at 12/27/14 2111  . magnesium hydroxide (MILK OF MAGNESIA) suspension 30 mL  30 mL Oral Daily PRN Toni Arthurs, MD      . methocarbamol (ROBAXIN) tablet 500 mg  500 mg Oral Q6H PRN Maretta Bees, MD   500 mg at 12/28/14 [phone]  . metoprolol tartrate (LOPRESSOR) tablet 12.5 mg  12.5 mg Oral BID Meredith Pel, NP   12.5 mg at 12/28/14 0912  . morphine 2 MG/ML injection 1-2 mg  1-2 mg Intravenous Q2H PRN Maretta Bees, MD   2 mg at 12/28/14 0911  . ondansetron (ZOFRAN) injection 4 mg  4 mg Intravenous Q6H PRN Meredith Pel, NP   4 mg at 12/28/14 0809  . oxyCODONE (Oxy  IR/ROXICODONE) immediate release tablet 10-15 mg  10-15 mg Oral Q6H PRN Maretta Bees, MD   15 mg at 12/28/14 0643  . pantoprazole (PROTONIX) EC tablet 40 mg  40 mg Oral QAC breakfast Meredith Pel, NP   40 mg at 12/28/14 0912  . polyethylene glycol (MIRALAX / GLYCOLAX) packet 17 g  17 g Oral Daily Maretta Bees, MD   17 g at 12/26/14 1756  . promethazine (PHENERGAN) injection 12.5 mg  12.5 mg Intravenous Q6H PRN Maretta Bees, MD   12.5 mg at 12/27/14 1240  . senna (SENOKOT) tablet 8.6 mg  1 tablet Oral BID Toni Arthurs, MD   8.6 mg at 12/26/14 2145  . sertraline (ZOLOFT) tablet 100 mg  100 mg Oral Daily Meredith Pel, NP   100 mg at 12/28/14 9604   Do not use this list as official medication orders. Please verify with discharge summary.  Discharge Medications:   Medication List    STOP taking these medications        heparin 5000 UNIT/ML injection      TAKE these medications        acetaminophen 500 MG tablet  Commonly known as:  TYLENOL  Take one tablet by mouth every 6 hours as needed for mild pain     bisacodyl 10 MG suppository  Commonly known as:  DULCOLAX  Place 1 suppository (10 mg total) rectally daily as needed for moderate constipation.     docusate sodium 100 MG capsule  Commonly known as:  COLACE  Take 1 capsule (100 mg total) by mouth 2 (two) times daily.     enoxaparin 40 MG/0.4ML injection  Commonly known as:  LOVENOX  Inject 0.4 mLs (40 mg total) into the skin daily.     guaiFENesin-dextromethorphan 100-10 MG/5ML syrup  Commonly known as:  ROBITUSSIN DM  10ml by mouth every 4 hours as needed for cough     ipratropium-albuterol 0.5-2.5 (3) MG/3ML Soln  Commonly known as:  DUONEB  Take 3 mLs by nebulization every 6 (six) hours as needed.     LORazepam 0.5 MG tablet  Commonly known as:  ATIVAN  Take 1 tablet (0.5 mg total) by mouth every 6 (six) hours as needed for anxiety.     metoprolol tartrate 25 MG tablet  Commonly known as:   LOPRESSOR  Take 12.5 mg by mouth 2 (two) times daily.     oxyCODONE 5 MG immediate release tablet  Commonly known as:  Oxy IR/ROXICODONE  Take 1 tablet (5 mg total) by mouth every 6 (six) hours as needed for severe pain. Take one to two tablets by mouth every 4 hours as needed for moderate  to severe pain     pantoprazole 40 MG tablet  Commonly known as:  PROTONIX  Take 40 mg by mouth daily. Before breakfast     PROCEL PO  Two scoops by mouth twice daily     sertraline 100 MG tablet  Commonly known as:  ZOLOFT  Take 100 mg by mouth daily.        Relevant Imaging Results:  Relevant Lab Results:  Recent Labs    Additional Information    Lebron Quam, LCSW

## 2014-12-28 NOTE — Clinical Social Work Note (Signed)
Clinical Social Work Assessment  Patient Details  Name: [name] MRN: [id number] Date of Birth: 05/21/1945  Date of referral:  12/28/14               Reason for consult:  Facility Placement                Permission sought to share information with:  Family Supports Permission granted to share information::  Yes, Verbal Permission Granted  Name::     Hotel managerzychowicz,Elizabeth  Agency::  [facility]  Relationship::  daughter  Contact Information:  [phone]  Housing/Transportation Living arrangements for the past 2 months:  Skilled Nursing Facility, Single Family Home Source of Information:  Patient Patient Interpreter Needed:  None Criminal Activity/Legal Involvement Pertinent to Current Situation/Hospitalization:  No - Comment as needed Significant Relationships:  Adult Children Lives with:  Self Do you feel safe going back to the place where you live?  Yes Need for family participation in patient care:  No (Coment)  Care giving concerns:  No concerns, at this time.   Social Worker assessment / plan:  Pt stated that [facility] and she was able to go to her daughter's for Thanksgiving.  While at her daughter's she fell.  She is ready for d/c and stated that she'd like to go back to [facility].  Employment status:  Retired Health and safety inspectornsurance information:  Medicare PT Recommendations:  Skilled Nursing Facility Information / Referral to community resources:   ([facility]  Patient/Family's Response to care:  Pt in agreement with returning to [facility].  Patient/Family's Understanding of and Emotional Response to Diagnosis, Current Treatment, and Prognosis:  Pt is fine with going back [facility] to finish out her rehab.  Emotional Assessment Appearance:  Appears stated age Attitude/Demeanor/Rapport:   (cooperative) Affect (typically observed):  Accepting Orientation:  Oriented to Self, Oriented to Place, Oriented to  Time Alcohol / Substance use:  Never  Used Psych involvement (Current and /or in the community):  No (Comment)  Discharge Needs  Concerns to be addressed:  No discharge needs identified Readmission within the last 30 days:  No Current discharge risk:  None Barriers to Discharge:  No Barriers Identified   Lebron QuamSimpson, Halah Whiteside Amanda, LCSW 12/28/2014, 10:51 AM

## 2014-12-29 ENCOUNTER — Non-Acute Institutional Stay (SKILLED_NURSING_FACILITY): Payer: Medicare Other | Admitting: Internal Medicine

## 2014-12-29 ENCOUNTER — Other Ambulatory Visit: Payer: Self-pay | Admitting: *Deleted

## 2014-12-29 ENCOUNTER — Encounter (HOSPITAL_COMMUNITY): Payer: Self-pay | Admitting: Orthopedic Surgery

## 2014-12-29 DIAGNOSIS — S82851S Displaced trimalleolar fracture of right lower leg, sequela: Secondary | ICD-10-CM

## 2014-12-29 DIAGNOSIS — K219 Gastro-esophageal reflux disease without esophagitis: Secondary | ICD-10-CM | POA: Diagnosis not present

## 2014-12-29 DIAGNOSIS — K5900 Constipation, unspecified: Secondary | ICD-10-CM

## 2014-12-29 DIAGNOSIS — F32A Depression, unspecified: Secondary | ICD-10-CM

## 2014-12-29 DIAGNOSIS — D62 Acute posthemorrhagic anemia: Secondary | ICD-10-CM

## 2014-12-29 DIAGNOSIS — S72002S Fracture of unspecified part of neck of left femur, sequela: Secondary | ICD-10-CM

## 2014-12-29 DIAGNOSIS — R5381 Other malaise: Secondary | ICD-10-CM

## 2014-12-29 DIAGNOSIS — R2681 Unsteadiness on feet: Secondary | ICD-10-CM | POA: Diagnosis not present

## 2014-12-29 DIAGNOSIS — I10 Essential (primary) hypertension: Secondary | ICD-10-CM | POA: Diagnosis not present

## 2014-12-29 DIAGNOSIS — F329 Major depressive disorder, single episode, unspecified: Secondary | ICD-10-CM

## 2014-12-29 DIAGNOSIS — E46 Unspecified protein-calorie malnutrition: Secondary | ICD-10-CM | POA: Diagnosis not present

## 2014-12-29 MED ORDER — OXYCODONE HCL 5 MG PO TABS: ORAL_TABLET | ORAL | Status: DC

## 2014-12-29 MED ORDER — LORAZEPAM 0.5 MG PO TABS: 0.5000 mg | ORAL_TABLET | Freq: Four times a day (QID) | ORAL | Status: DC | PRN

## 2014-12-29 NOTE — Telephone Encounter (Signed)
[facility] 

## 2014-12-29 NOTE — Progress Notes (Signed)
Patient ID: [name], female   DOB: 1945-08-17, 69 y.o.   MRN: [id number]     [facility]  PCP: [facility]  Code Status: Full Code   Allergies  Allergen Reactions  . Sulfa Antibiotics     Hives     Chief Complaint  Patient presents with  . New Admit To SNF    New Admission      HPI:  69 y.o. patient is here for short term rehabilitation post hospital admission from 12/25/14-12/28/14 post fall with left femur intertrochanteric fracture. She under went ORIF. She is seen in her room today. Her pain is not under control at present with current regimen. She also complaints of muscle tightness. No other concerns from patient. Of note, she was here undergoing rehabilitation for  left comminuted fracture of trimalleolar area with displacement of ankle joint s/p ORIF. She had gone to her daughter's for thanksgiving where during her transfer, she had a fall.    Review of Systems:  Constitutional: Negative for fever, diaphoresis. has cold intolerance HENT: Negative for headache, congestion, nasal discharge Eyes: Negative for eye pain, blurred vision, double vision and discharge.  Respiratory: Negative for shortness of breath at rest.  Cardiovascular: Negative for chest pain, palpitations, leg swelling.  Gastrointestinal: Negative for heartburn, nausea, vomiting, abdominal pain.  Genitourinary: Negative for dysuria, flank pain.  Musculoskeletal: Negative for back pain, falls in the facility Skin: Negative for itching, rash.  Neurological: positive for generalized weakness and has positional dizziness. Negative for tingling, focal weakness Psychiatric/Behavioral: Negative for depression, memory loss.     Past Medical History  Diagnosis Date  . Chicken pox   . Chest pain   . SOB (shortness of breath) on exertion   . Fall 06/15/2011    Caused discomfort in lower left rib and left lateral hip area  . H/O: hysterectomy 1985  . Depressive  disorder   . Pre-syncope   . Dizziness   . OCD (obsessive compulsive disorder)   . Anxiety   . Opioid abuse   . Hypokalemia   . Severe sepsis (HCC)   . Anemia   . Azotemia   . Left trimalleolar fracture   . Respiratory failure (HCC)   . History of MRSA infection   . Leukocytosis    Past Surgical History  Procedure Laterality Date  . Gallbladder surgery    . Appendectomy    . Total abdominal hysterectomy    . Intramedullary (im) nail intertrochanteric Left 12/25/2014    Procedure: INTRAMEDULLARY (IM) NAIL INTERTROCHANTRIC;  Surgeon: Toni Arthurs, MD;  Location: [facility] OR;  Service: Orthopedics;  Laterality: Left;   Social History:   reports that she has never smoked. She does not have any smokeless tobacco history on file. She reports that she does not drink alcohol or use illicit drugs.  Family History  Problem Relation Age of Onset  . Heart attack Mother   . Colon cancer Father   . Diabetes Brother   . Heart attack Brother 25    MI    Medications:   Medication List       This list is accurate as of: 12/29/14  4:18 PM.  Always use your most recent med list.               acetaminophen 500 MG tablet  Commonly known as:  TYLENOL  Take one tablet by mouth every 6 hours as needed for mild pain  bisacodyl 10 MG suppository  Commonly known as:  DULCOLAX  Place 1 suppository (10 mg total) rectally daily as needed for moderate constipation.     docusate sodium 100 MG capsule  Commonly known as:  COLACE  Take 1 capsule (100 mg total) by mouth 2 (two) times daily.     enoxaparin 40 MG/0.4ML injection  Commonly known as:  LOVENOX  Inject 0.4 mLs (40 mg total) into the skin daily.     guaiFENesin-dextromethorphan 100-10 MG/5ML syrup  Commonly known as:  ROBITUSSIN DM  10ml by mouth every 4 hours as needed for cough     ipratropium-albuterol 0.5-2.5 (3) MG/3ML Soln  Commonly known as:  DUONEB  Take 3 mLs by nebulization every 6 (six) hours as needed.      LORazepam 0.5 MG tablet  Commonly known as:  ATIVAN  Take 1 tablet (0.5 mg total) by mouth every 6 (six) hours as needed for anxiety.     metoprolol tartrate 25 MG tablet  Commonly known as:  LOPRESSOR  Take 25 mg by mouth 2 (two) times daily. Hold for SBP < 100 and HR < 60     oxyCODONE 5 MG immediate release tablet  Commonly known as:  Oxy IR/ROXICODONE  Take one tablet by mouth every 6 hours as needed for severe pain     pantoprazole 40 MG tablet  Commonly known as:  PROTONIX  Take 40 mg by mouth daily. Before breakfast     PROCEL PO  Two scoops by mouth twice daily     sertraline 100 MG tablet  Commonly known as:  ZOLOFT  Take 100 mg by mouth daily.     UNABLE TO FIND  Med Name: Med Pass 90 mL by mouth twice daily for nutritional support         Physical Exam: Filed Vitals:   12/29/14 1441  BP: 137/61  Pulse: 110  Temp: 97.3 F (36.3 C)  TempSrc: Oral  Resp: 18  Height:  (1.575 m)  Weight: 113 lb (51.256 kg)  SpO2: 96%     General- elderly female, thin built, frail, in no acute distress Head- normocephalic, atraumatic Nose- normal nasal mucosa, no maxillary or frontal sinus tenderness, no nasal discharge Throat- moist mucus membrane Eyes- PERRLA, EOMI, no pallor, no icterus, no discharge, normal conjunctiva, normal sclera Neck- no cervical lymphadenopathy, no thyromegaly, no jugular vein distension Cardiovascular- normal s1,s2, no murmurs, palpable dorsalis pedis and radial pulses, no leg edema Respiratory- bilateral clear to auscultation, no wheeze, no rhonchi, no crackles, no use of accessory muscles Abdomen- bowel sounds present, soft, non tender Musculoskeletal- NWB to LLE, able to move all other 3 extremities but ROM to left leg limited, on wheelchair Neurological- no focal deficit, alert and oriented to person, place and time Skin- warm and dry, left leg in CAM walker boot, able to move her toes and has good capillary refill, right hip has  aquacel dressing in place Psychiatry- normal mood and affect   Labs reviewed: Basic Metabolic Panel:  Recent Labs  16/10/96 1313 12/26/14 0400 12/27/14 0408  NA 134* 135 135  K 5.1 4.3 3.7  CL 97* 102 103  CO2 GLUCOSE 141* 118* 114*  BUN CREATININE 1.27* 1.35* 0.85  CALCIUM 9.7 8.6* 8.3*   Liver Function Tests:  Recent Labs  11/20/14 12/25/14 1313  AST 25 28  ALT 28 23  ALKPHOS 309* 120  BILITOT  --  0.8  PROT  --  7.1  ALBUMIN  --  3.5   No results for input(s): LIPASE, AMYLASE in the last 8760 hours. No results for input(s): AMMONIA in the last 8760 hours. CBC:  Recent Labs  05/18/14 1322  12/25/14 1313 12/26/14 0400 12/27/14 0408 12/28/14 0345  WBC 8.0  < > 12.3* 8.1 6.0 7.1  NEUTROABS 6.8  --  10.6*  --   --   --   HGB 11.2*  < > 11.6* 9.5* 8.2* 8.5*  HCT 33.2*  < > 35.0* 29.6* 25.1* 26.3*  MCV 90.5  < > 90.0 90.2 90.3 91.0  PLT 224  < > 361 296 223 243  < > = values in this interval not displayed. Cardiac Enzymes:  Recent Labs  08/07/14 2130 08/08/14 0020 08/08/14 0219  TROPONINI <0.03 <0.03 <0.03   BNP: Invalid input(s): POCBNP CBG:  Recent Labs  08/07/14 2057 08/08/14 0726 08/08/14 1142  GLUCAP 122* 95 95    Radiological Exams: Dg Chest 1 View  12/25/2014  CLINICAL DATA:  Fall today with left femoral fracture EXAM: CHEST  1 VIEW COMPARISON:  08/07/2014 FINDINGS: Cardiac shadow is within normal limits. Patient is significantly rotated to the left accentuating the mediastinal markings. Some mild spiculated density is noted in the right lung base which may be accentuated by the patient rotation. Frontal and lateral view of the chest is recommended when the patient's clinical condition improves to allow for optimum imaging. IMPRESSION: Changes in the right lung base as described above. Follow-up two view chest x-ray is recommended when patient's condition improves. Electronically Signed   By: Alcide CleverMark  Lukens M.D.   On:  12/25/2014 13:21   Ct Chest Wo Contrast  12/25/2014  CLINICAL DATA:  Left hip fracture. Abnormal chest radiographs. Surgical clearance. Initial encounter. EXAM: CT CHEST WITHOUT CONTRAST TECHNIQUE: Multidetector CT imaging of the chest was performed following the standard protocol without IV contrast. COMPARISON:  Chest radiographs 12/25/2014, 08/07/2014 and 07/23/2013. FINDINGS: Mediastinum/Nodes: There are no enlarged mediastinal, hilar or axillary lymph nodes.Hilar assessment is mildly limited by the lack of intravenous contrast. There is a small hiatal hernia. The trachea and thyroid gland appear unremarkable. The heart size is normal. There is no pericardial effusion. Coronary artery calcifications are noted. Lungs/Pleura: There are moderate size dependent pleural effusions bilaterally. There is volume loss, bronchiectasis and peribronchial streaky densities in both upper lobes. There are a lesser linear densities in the lower lobes and right middle lobe. No suspicious pulmonary nodules are present at the lung bases. There are small subpleural nodules in both lower lobes, measuring 5 mm on the right (image number 18) and on the left (image 26). Upper abdomen: The visualized liver appears unremarkable. There is mild extrahepatic biliary dilatation status post cholecystectomy. No adrenal mass. There is a complex appearing soft tissue mass projecting from the lower pole of the left kidney, measuring at least 3.2 cm transverse on image 61. This is incompletely visualized, but has a density of 36 HU. There are other smaller low-density and hyperdense left renal lesions. Musculoskeletal/Chest wall: There is no chest wall mass or suspicious osseous finding. No acute osseous findings. There is Schmorl's node formation in the superior endplate of the T8 vertebral body. IMPRESSION: 1. The radiographic densities appear inflammatory with predominately upper lobe involvement. There is associated volume loss and  bronchiectasis, suggesting chronic inflammation. This could be infectious, secondary to sarcoidosis or previous radiation therapy. Correlate clinically. 2. No findings suspicious for thoracic neoplasm. 3. Moderate size  bilateral pleural effusions. 4. Concern of possible left renal mass, incompletely characterized without contrast. This requires further evaluation to exclude renal cell carcinoma. If the patient is able, this would be best accomplished with renal MRI to include pre and post-contrast imaging. Electronically Signed   By: Carey Bullocks M.D.   On: 12/25/2014 16:45   Dg Hip Operative Unilat With Pelvis Left  12/25/2014  CLINICAL DATA:  Left hip fracture. EXAM: OPERATIVE LEFT HIP (WITH PELVIS IF PERFORMED) 4 VIEWS TECHNIQUE: Fluoroscopic spot image(s) were submitted for interpretation post-operatively. COMPARISON:  None. FINDINGS: Screw and intramedullary rod fixation of intertrochanteric hip fracture is seen which is in near anatomic alignment. IMPRESSION: Internal fixation of left hip fracture in near anatomic alignment Electronically Signed   By: Myles Rosenthal M.D.   On: 12/25/2014 18:25   Dg Hip Unilat With Pelvis 2-3 Views Left  12/25/2014  CLINICAL DATA:  Left hip pain after falling today. Initial encounter. EXAM: DG HIP (WITH OR WITHOUT PELVIS) 2-3V LEFT COMPARISON:  None. FINDINGS: The bones are demineralized. There is a comminuted and angulated intertrochanteric left femur fracture. No associated dislocation. There are asymmetric degenerative changes of the left hip. IMPRESSION: Acute intertrochanteric left femur fracture with mild displacement and angulation. No dislocation. Electronically Signed   By: Carey Bullocks M.D.   On: 12/25/2014 13:18    Assessment/Plan  Physical deconditioning Will have her work with physical therapy and occupational therapy team to help with gait training and muscle strengthening exercises.fall precautions. Skin care. Encourage to be out of bed.    Unsteady gait With recent left ankle fracture and right femur fracture. Will have patient work with PT/OT as tolerated to regain strength and restore function.  Fall precautions are in place.  Right femur fracture S/p IM nailing. Currently on tylenol 500 mg q6h prn mild pain and oxyIR 5 mg q 6h prn moderate to severe pain. Pain not controlled. Change her pain regimen to tylenol 500 mg 2 tab bid for now and oxyIR to 1-2 tab q6h prn moderate to severe pain. Get physiatry consult. Add robaxin 500 mg q8h prn muscle spasm and monitor. To work with therapy team and to take hip precautions. Continue lovenox for dvt prophylaxis. Has f/u with orthopedics. To work with rehab team  Left ankle fracture S/p ORIF.  Will have her work with physical therapy and occupational therapy team.  Blood loss anemia S/p surgery, check cbc in 1 week  Protein calorie malnutrition To be followed by dietary team, continue procel supplement and monitor weight  gerd Stable, continue pantoprazole 40 mg daily  HTN Stable, continue lopressor 12.5 mg bid and monitor bp daily for now  Chronic depression  Continue zoloft 100 mg daily for now and monitor  Constipation Continue colace 100 mg bid and prn dulcolax suppository   Goals of care: short term rehabilitation   Labs/tests ordered: cbc, cmp 1 week  Family/ staff Communication: reviewed care plan with patient and nursing supervisor    Oneal Grout, MD  [facility] [phone] (Monday-Friday 8 am - 5 pm) [phone] (afterhours)

## 2015-02-12 ENCOUNTER — Other Ambulatory Visit: Payer: Self-pay | Admitting: Physician Assistant

## 2015-02-12 DIAGNOSIS — N2889 Other specified disorders of kidney and ureter: Secondary | ICD-10-CM

## 2015-02-27 ENCOUNTER — Emergency Department (HOSPITAL_COMMUNITY): Payer: Medicare Other

## 2015-02-27 ENCOUNTER — Encounter (HOSPITAL_COMMUNITY): Payer: Self-pay | Admitting: Emergency Medicine

## 2015-02-27 ENCOUNTER — Inpatient Hospital Stay (HOSPITAL_COMMUNITY)
Admission: EM | Admit: 2015-02-27 | Discharge: 2015-03-04 | DRG: 492 | Disposition: A | Discharge status: Skilled Nursing Facility | Payer: Medicare Other | Attending: Family Medicine | Admitting: Family Medicine

## 2015-02-27 ENCOUNTER — Inpatient Hospital Stay (HOSPITAL_COMMUNITY)
Admit: 2015-02-27 | Discharge: 2015-02-27 | Disposition: A | Discharge status: Home / Self Care | Payer: Medicare Other | Attending: Emergency Medicine | Admitting: Emergency Medicine

## 2015-02-27 ENCOUNTER — Inpatient Hospital Stay (HOSPITAL_COMMUNITY): Payer: Medicare Other | Admitting: Anesthesiology

## 2015-02-27 ENCOUNTER — Emergency Department (HOSPITAL_COMMUNITY): Admit: 2015-02-27 | Payer: Medicare Other

## 2015-02-27 ENCOUNTER — Inpatient Hospital Stay (HOSPITAL_COMMUNITY): Payer: Medicare Other

## 2015-02-27 ENCOUNTER — Other Ambulatory Visit: Payer: Self-pay

## 2015-02-27 ENCOUNTER — Encounter (HOSPITAL_COMMUNITY)
Admission: EM | Disposition: A | Discharge status: Skilled Nursing Facility | Payer: Self-pay | Source: Home / Self Care | Attending: Internal Medicine

## 2015-02-27 DIAGNOSIS — F329 Major depressive disorder, single episode, unspecified: Secondary | ICD-10-CM | POA: Diagnosis present

## 2015-02-27 DIAGNOSIS — E876 Hypokalemia: Secondary | ICD-10-CM | POA: Diagnosis not present

## 2015-02-27 DIAGNOSIS — R000 Tachycardia, unspecified: Secondary | ICD-10-CM

## 2015-02-27 DIAGNOSIS — F429 Obsessive-compulsive disorder, unspecified: Secondary | ICD-10-CM | POA: Diagnosis present

## 2015-02-27 DIAGNOSIS — Z7982 Long term (current) use of aspirin: Secondary | ICD-10-CM

## 2015-02-27 DIAGNOSIS — T84629D Infection and inflammatory reaction due to internal fixation device of unspecified bone of leg, subsequent encounter: Secondary | ICD-10-CM | POA: Diagnosis not present

## 2015-02-27 DIAGNOSIS — L03116 Cellulitis of left lower limb: Secondary | ICD-10-CM | POA: Diagnosis present

## 2015-02-27 DIAGNOSIS — T84629A Infection and inflammatory reaction due to internal fixation device of unspecified bone of leg, initial encounter: Secondary | ICD-10-CM | POA: Diagnosis present

## 2015-02-27 DIAGNOSIS — T8142XA Infection following a procedure, deep incisional surgical site, initial encounter: Secondary | ICD-10-CM | POA: Diagnosis present

## 2015-02-27 DIAGNOSIS — A419 Sepsis, unspecified organism: Secondary | ICD-10-CM | POA: Diagnosis present

## 2015-02-27 DIAGNOSIS — D638 Anemia in other chronic diseases classified elsewhere: Secondary | ICD-10-CM | POA: Diagnosis present

## 2015-02-27 DIAGNOSIS — M79672 Pain in left foot: Secondary | ICD-10-CM | POA: Diagnosis present

## 2015-02-27 DIAGNOSIS — M25552 Pain in left hip: Secondary | ICD-10-CM

## 2015-02-27 DIAGNOSIS — B9689 Other specified bacterial agents as the cause of diseases classified elsewhere: Secondary | ICD-10-CM | POA: Diagnosis present

## 2015-02-27 DIAGNOSIS — Z09 Encounter for follow-up examination after completed treatment for conditions other than malignant neoplasm: Secondary | ICD-10-CM | POA: Diagnosis not present

## 2015-02-27 DIAGNOSIS — M869 Osteomyelitis, unspecified: Secondary | ICD-10-CM | POA: Diagnosis present

## 2015-02-27 DIAGNOSIS — M7060 Trochanteric bursitis, unspecified hip: Secondary | ICD-10-CM | POA: Diagnosis present

## 2015-02-27 DIAGNOSIS — L02416 Cutaneous abscess of left lower limb: Secondary | ICD-10-CM | POA: Diagnosis not present

## 2015-02-27 DIAGNOSIS — E860 Dehydration: Secondary | ICD-10-CM | POA: Diagnosis present

## 2015-02-27 DIAGNOSIS — L0390 Cellulitis, unspecified: Secondary | ICD-10-CM | POA: Diagnosis not present

## 2015-02-27 DIAGNOSIS — Z7902 Long term (current) use of antithrombotics/antiplatelets: Secondary | ICD-10-CM | POA: Diagnosis not present

## 2015-02-27 DIAGNOSIS — T8130XA Disruption of wound, unspecified, initial encounter: Secondary | ICD-10-CM | POA: Diagnosis present

## 2015-02-27 DIAGNOSIS — R7881 Bacteremia: Secondary | ICD-10-CM | POA: Insufficient documentation

## 2015-02-27 DIAGNOSIS — I5032 Chronic diastolic (congestive) heart failure: Secondary | ICD-10-CM | POA: Diagnosis present

## 2015-02-27 DIAGNOSIS — R748 Abnormal levels of other serum enzymes: Secondary | ICD-10-CM | POA: Diagnosis present

## 2015-02-27 DIAGNOSIS — B9561 Methicillin susceptible Staphylococcus aureus infection as the cause of diseases classified elsewhere: Secondary | ICD-10-CM | POA: Diagnosis present

## 2015-02-27 DIAGNOSIS — D62 Acute posthemorrhagic anemia: Secondary | ICD-10-CM | POA: Diagnosis not present

## 2015-02-27 DIAGNOSIS — T8131XA Disruption of external operation (surgical) wound, not elsewhere classified, initial encounter: Secondary | ICD-10-CM

## 2015-02-27 DIAGNOSIS — R509 Fever, unspecified: Secondary | ICD-10-CM | POA: Diagnosis not present

## 2015-02-27 DIAGNOSIS — T814XXA Infection following a procedure, initial encounter: Secondary | ICD-10-CM | POA: Diagnosis not present

## 2015-02-27 DIAGNOSIS — Y838 Other surgical procedures as the cause of abnormal reaction of the patient, or of later complication, without mention of misadventure at the time of the procedure: Secondary | ICD-10-CM | POA: Diagnosis not present

## 2015-02-27 DIAGNOSIS — T8140XA Infection following a procedure, unspecified, initial encounter: Secondary | ICD-10-CM | POA: Insufficient documentation

## 2015-02-27 HISTORY — PX: HARDWARE REMOVAL: SHX979

## 2015-02-27 HISTORY — DX: Infection following a procedure, deep incisional surgical site, initial encounter: T81.42XA

## 2015-02-27 HISTORY — PX: APPLICATION OF WOUND VAC: SHX5189

## 2015-02-27 LAB — CBC WITH DIFFERENTIAL/PLATELET
BASOS PCT: 0 %
Basophils Absolute: 0 10*3/uL (ref 0.0–0.1)
EOS ABS: 0 10*3/uL (ref 0.0–0.7)
Eosinophils Relative: 0 %
HCT: 29.8 % — ABNORMAL LOW (ref 36.0–46.0)
HEMOGLOBIN: 9.8 g/dL — AB (ref 12.0–15.0)
Lymphocytes Relative: 7 %
Lymphs Abs: 0.7 10*3/uL (ref 0.7–4.0)
MCH: 28.7 pg (ref 26.0–34.0)
MCHC: 32.9 g/dL (ref 30.0–36.0)
MCV: 87.1 fL (ref 78.0–100.0)
MONOS PCT: 6 %
Monocytes Absolute: 0.6 10*3/uL (ref 0.1–1.0)
NEUTROS PCT: 87 %
Neutro Abs: 8.4 10*3/uL — ABNORMAL HIGH (ref 1.7–7.7)
Platelets: 534 10*3/uL — ABNORMAL HIGH (ref 150–400)
RBC: 3.42 MIL/uL — ABNORMAL LOW (ref 3.87–5.11)
RDW: 17.6 % — AB (ref 11.5–15.5)
WBC: 9.8 10*3/uL (ref 4.0–10.5)

## 2015-02-27 LAB — COMPREHENSIVE METABOLIC PANEL
ALBUMIN: 2.4 g/dL — AB (ref 3.5–5.0)
ALT: 37 U/L (ref 14–54)
ANION GAP: 14 (ref 5–15)
AST: 85 U/L — ABNORMAL HIGH (ref 15–41)
Alkaline Phosphatase: 497 U/L — ABNORMAL HIGH (ref 38–126)
BILIRUBIN TOTAL: 0.6 mg/dL (ref 0.3–1.2)
BUN: 14 mg/dL (ref 6–20)
CO2: 22 mmol/L (ref 22–32)
Calcium: 8.7 mg/dL — ABNORMAL LOW (ref 8.9–10.3)
Chloride: 105 mmol/L (ref 101–111)
Creatinine, Ser: 0.88 mg/dL (ref 0.44–1.00)
Glucose, Bld: 124 mg/dL — ABNORMAL HIGH (ref 65–99)
POTASSIUM: 3.5 mmol/L (ref 3.5–5.1)
Sodium: 141 mmol/L (ref 135–145)
TOTAL PROTEIN: 6.5 g/dL (ref 6.5–8.1)

## 2015-02-27 LAB — I-STAT CG4 LACTIC ACID, ED: LACTIC ACID, VENOUS: 2.41 mmol/L — AB (ref 0.5–2.0)

## 2015-02-27 LAB — LACTIC ACID, PLASMA: Lactic Acid, Venous: 0.8 mmol/L (ref 0.5–2.0)

## 2015-02-27 LAB — BRAIN NATRIURETIC PEPTIDE: B Natriuretic Peptide: 272.4 pg/mL — ABNORMAL HIGH (ref 0.0–100.0)

## 2015-02-27 LAB — SEDIMENTATION RATE: SED RATE: 116 mm/h — AB (ref 0–22)

## 2015-02-27 LAB — C-REACTIVE PROTEIN: CRP: 33.6 mg/dL — ABNORMAL HIGH (ref <1.0)

## 2015-02-27 SURGERY — REMOVAL, HARDWARE
Anesthesia: General | Site: Ankle | Laterality: Left

## 2015-02-27 MED ORDER — PANTOPRAZOLE SODIUM 40 MG PO TBEC
40.0000 mg | DELAYED_RELEASE_TABLET | Freq: Every day | ORAL | Status: DC
  Administered 2015-02-28 – 2015-03-04 (×5): 40 mg via ORAL
  Filled 2015-02-27 (×5): qty 1

## 2015-02-27 MED ORDER — FENTANYL CITRATE (PF) 100 MCG/2ML IJ SOLN
INTRAMUSCULAR | Status: DC | PRN
  Administered 2015-02-27: 50 ug via INTRAVENOUS
  Administered 2015-02-27: 100 ug via INTRAVENOUS
  Administered 2015-02-27 (×2): 50 ug via INTRAVENOUS

## 2015-02-27 MED ORDER — PROPOFOL 10 MG/ML IV BOLUS
INTRAVENOUS | Status: DC | PRN
  Administered 2015-02-27: 180 mg via INTRAVENOUS

## 2015-02-27 MED ORDER — METOCLOPRAMIDE HCL 5 MG PO TABS: 5.0000 mg | ORAL_TABLET | Freq: Three times a day (TID) | ORAL | Status: DC | PRN

## 2015-02-27 MED ORDER — METOPROLOL TARTRATE 12.5 MG HALF TABLET
12.5000 mg | ORAL_TABLET | Freq: Two times a day (BID) | ORAL | Status: DC
  Administered 2015-02-27 – 2015-03-01 (×4): 12.5 mg via ORAL
  Filled 2015-02-27 (×4): qty 1

## 2015-02-27 MED ORDER — PROPOFOL 10 MG/ML IV BOLUS
INTRAVENOUS | Status: AC
  Filled 2015-02-27: qty 20

## 2015-02-27 MED ORDER — SODIUM CHLORIDE 0.9 % IV SOLN: INTRAVENOUS | Status: DC

## 2015-02-27 MED ORDER — VANCOMYCIN HCL 500 MG IV SOLR
500.0000 mg | Freq: Two times a day (BID) | INTRAVENOUS | Status: DC
  Administered 2015-02-28 – 2015-03-02 (×5): 500 mg via INTRAVENOUS
  Filled 2015-02-27 (×6): qty 500

## 2015-02-27 MED ORDER — MORPHINE SULFATE (PF) 4 MG/ML IV SOLN
4.0000 mg | INTRAVENOUS | Status: AC | PRN
  Administered 2015-02-27 (×3): 4 mg via INTRAVENOUS
  Filled 2015-02-27 (×3): qty 1

## 2015-02-27 MED ORDER — FENTANYL CITRATE (PF) 250 MCG/5ML IJ SOLN
INTRAMUSCULAR | Status: AC
  Filled 2015-02-27: qty 5

## 2015-02-27 MED ORDER — MIDAZOLAM HCL 2 MG/2ML IJ SOLN
INTRAMUSCULAR | Status: AC
  Filled 2015-02-27: qty 2

## 2015-02-27 MED ORDER — LACTATED RINGERS IV SOLN
INTRAVENOUS | Status: DC
  Administered 2015-02-27: 18:00:00 via INTRAVENOUS

## 2015-02-27 MED ORDER — LIDOCAINE HCL (CARDIAC) 20 MG/ML IV SOLN
INTRAVENOUS | Status: DC | PRN
  Administered 2015-02-27: 70 mg via INTRAVENOUS
  Administered 2015-02-27: 30 mg via INTRAVENOUS

## 2015-02-27 MED ORDER — SENNOSIDES-DOCUSATE SODIUM 8.6-50 MG PO TABS: 1.0000 | ORAL_TABLET | Freq: Every evening | ORAL | Status: DC | PRN

## 2015-02-27 MED ORDER — PIPERACILLIN-TAZOBACTAM 3.375 G IVPB
3.3750 g | Freq: Three times a day (TID) | INTRAVENOUS | Status: DC
  Administered 2015-02-27 – 2015-02-28 (×2): 3.375 g via INTRAVENOUS
  Filled 2015-02-27 (×4): qty 50

## 2015-02-27 MED ORDER — HYDROMORPHONE HCL 1 MG/ML IJ SOLN
INTRAMUSCULAR | Status: AC
  Filled 2015-02-27: qty 1

## 2015-02-27 MED ORDER — ASPIRIN EC 325 MG PO TBEC
325.0000 mg | DELAYED_RELEASE_TABLET | Freq: Every day | ORAL | Status: DC
  Administered 2015-02-28 – 2015-03-04 (×5): 325 mg via ORAL
  Filled 2015-02-27 (×5): qty 1

## 2015-02-27 MED ORDER — MIDAZOLAM HCL 5 MG/5ML IJ SOLN
INTRAMUSCULAR | Status: DC | PRN
  Administered 2015-02-27: 2 mg via INTRAVENOUS

## 2015-02-27 MED ORDER — SODIUM CHLORIDE 0.9 % IR SOLN
Status: DC | PRN
  Administered 2015-02-27: 6000 mL

## 2015-02-27 MED ORDER — METOCLOPRAMIDE HCL 5 MG/ML IJ SOLN: 5.0000 mg | Freq: Three times a day (TID) | INTRAMUSCULAR | Status: DC | PRN

## 2015-02-27 MED ORDER — HYDROMORPHONE HCL 1 MG/ML IJ SOLN
0.2500 mg | INTRAMUSCULAR | Status: DC | PRN
  Administered 2015-02-27: 0.5 mg via INTRAVENOUS

## 2015-02-27 MED ORDER — ONDANSETRON HCL 4 MG/2ML IJ SOLN
4.0000 mg | Freq: Once | INTRAMUSCULAR | Status: AC
  Administered 2015-02-27: 4 mg via INTRAVENOUS
  Filled 2015-02-27: qty 2

## 2015-02-27 MED ORDER — ONDANSETRON HCL 4 MG PO TABS
4.0000 mg | ORAL_TABLET | Freq: Four times a day (QID) | ORAL | Status: DC | PRN
  Administered 2015-03-02: 4 mg via ORAL
  Filled 2015-02-27: qty 1

## 2015-02-27 MED ORDER — ALBUMIN HUMAN 5 % IV SOLN
INTRAVENOUS | Status: DC | PRN
  Administered 2015-02-27: 19:00:00 via INTRAVENOUS

## 2015-02-27 MED ORDER — CALCIUM CARBONATE 1250 (500 CA) MG PO TABS
1250.0000 mg | ORAL_TABLET | Freq: Every day | ORAL | Status: DC
  Administered 2015-02-28 – 2015-03-04 (×5): 1250 mg via ORAL
  Filled 2015-02-27 (×6): qty 1

## 2015-02-27 MED ORDER — ARTIFICIAL TEARS OP OINT
TOPICAL_OINTMENT | OPHTHALMIC | Status: AC
  Filled 2015-02-27: qty 3.5

## 2015-02-27 MED ORDER — METOPROLOL TARTRATE 25 MG PO TABS
25.0000 mg | ORAL_TABLET | Freq: Once | ORAL | Status: AC
  Administered 2015-02-27: 25 mg via ORAL
  Filled 2015-02-27: qty 1

## 2015-02-27 MED ORDER — ONDANSETRON HCL 4 MG/2ML IJ SOLN
4.0000 mg | Freq: Four times a day (QID) | INTRAMUSCULAR | Status: DC | PRN
  Administered 2015-03-02 – 2015-03-04 (×3): 4 mg via INTRAVENOUS
  Filled 2015-02-27 (×3): qty 2

## 2015-02-27 MED ORDER — VANCOMYCIN HCL IN DEXTROSE 1-5 GM/200ML-% IV SOLN: 1000.0000 mg | Freq: Once | INTRAVENOUS | Status: DC

## 2015-02-27 MED ORDER — ONDANSETRON HCL 4 MG/2ML IJ SOLN
INTRAMUSCULAR | Status: DC | PRN
  Administered 2015-02-27: 4 mg via INTRAVENOUS

## 2015-02-27 MED ORDER — ONDANSETRON HCL 4 MG/2ML IJ SOLN: 4.0000 mg | Freq: Four times a day (QID) | INTRAMUSCULAR | Status: DC | PRN

## 2015-02-27 MED ORDER — FENTANYL CITRATE (PF) 100 MCG/2ML IJ SOLN: 25.0000 ug | INTRAMUSCULAR | Status: DC | PRN

## 2015-02-27 MED ORDER — VANCOMYCIN HCL IN DEXTROSE 1-5 GM/200ML-% IV SOLN
1000.0000 mg | INTRAVENOUS | Status: AC
  Administered 2015-02-27 (×2): 1000 mg via INTRAVENOUS
  Filled 2015-02-27: qty 200

## 2015-02-27 MED ORDER — ONDANSETRON HCL 4 MG PO TABS: 4.0000 mg | ORAL_TABLET | Freq: Four times a day (QID) | ORAL | Status: DC | PRN

## 2015-02-27 MED ORDER — LACTATED RINGERS IV SOLN
INTRAVENOUS | Status: DC | PRN
  Administered 2015-02-27: 18:00:00 via INTRAVENOUS

## 2015-02-27 MED ORDER — ACETAMINOPHEN 650 MG RE SUPP: 650.0000 mg | Freq: Four times a day (QID) | RECTAL | Status: DC | PRN

## 2015-02-27 MED ORDER — ONDANSETRON HCL 4 MG/2ML IJ SOLN
INTRAMUSCULAR | Status: AC
  Filled 2015-02-27: qty 2

## 2015-02-27 MED ORDER — ENOXAPARIN SODIUM 40 MG/0.4ML ~~LOC~~ SOLN: 40.0000 mg | SUBCUTANEOUS | Status: DC

## 2015-02-27 MED ORDER — PROMETHAZINE HCL 25 MG/ML IJ SOLN: 6.2500 mg | INTRAMUSCULAR | Status: DC | PRN

## 2015-02-27 MED ORDER — BISACODYL 10 MG RE SUPP: 10.0000 mg | Freq: Every day | RECTAL | Status: DC | PRN

## 2015-02-27 MED ORDER — SERTRALINE HCL 100 MG PO TABS
100.0000 mg | ORAL_TABLET | Freq: Every day | ORAL | Status: DC
  Administered 2015-02-27 – 2015-03-04 (×6): 100 mg via ORAL
  Filled 2015-02-27 (×6): qty 1

## 2015-02-27 MED ORDER — CALCIUM CARBONATE 1500 (600 CA) MG PO TABS: 600.0000 mg | ORAL_TABLET | Freq: Every day | ORAL | Status: DC

## 2015-02-27 MED ORDER — ENOXAPARIN SODIUM 40 MG/0.4ML ~~LOC~~ SOLN
40.0000 mg | SUBCUTANEOUS | Status: DC
  Administered 2015-02-28 – 2015-03-04 (×5): 40 mg via SUBCUTANEOUS
  Filled 2015-02-27 (×5): qty 0.4

## 2015-02-27 MED ORDER — MORPHINE SULFATE (PF) 2 MG/ML IV SOLN: 2.0000 mg | INTRAVENOUS | Status: DC | PRN

## 2015-02-27 MED ORDER — ACETAMINOPHEN 325 MG PO TABS
650.0000 mg | ORAL_TABLET | Freq: Four times a day (QID) | ORAL | Status: DC | PRN
  Administered 2015-02-28 – 2015-03-04 (×4): 650 mg via ORAL
  Filled 2015-02-27 (×4): qty 2

## 2015-02-27 MED ORDER — ESMOLOL HCL 100 MG/10ML IV SOLN
INTRAVENOUS | Status: DC | PRN
  Administered 2015-02-27: 40 mg via INTRAVENOUS

## 2015-02-27 MED ORDER — DEXTROSE 5 % IV SOLN
INTRAVENOUS | Status: DC | PRN
  Administered 2015-02-27: 18:00:00 via INTRAVENOUS

## 2015-02-27 MED ORDER — SODIUM CHLORIDE 0.9% FLUSH
3.0000 mL | Freq: Two times a day (BID) | INTRAVENOUS | Status: DC
  Administered 2015-03-01 – 2015-03-02 (×3): 3 mL via INTRAVENOUS

## 2015-02-27 SURGICAL SUPPLY — 38 items
BLADE LONG MED 31MMX9MM (MISCELLANEOUS)
BLADE LONG MED 31X9 (MISCELLANEOUS) IMPLANT
BNDG COHESIVE 4X5 TAN STRL (GAUZE/BANDAGES/DRESSINGS) ×4 IMPLANT
BNDG GAUZE ELAST 4 BULKY (GAUZE/BANDAGES/DRESSINGS) ×4 IMPLANT
CANISTER WOUND CARE 500ML ATS (WOUND CARE) ×4 IMPLANT
CHLORAPREP W/TINT 26ML (MISCELLANEOUS) ×4 IMPLANT
COVER SURGICAL LIGHT HANDLE (MISCELLANEOUS) ×4 IMPLANT
CUFF TOURNIQUET SINGLE 34IN LL (TOURNIQUET CUFF) ×4 IMPLANT
DRAPE ORTHO SPLIT 77X108 STRL (DRAPES) ×4
DRAPE SURG ORHT 6 SPLT 77X108 (DRAPES) ×4 IMPLANT
DRAPE U-SHAPE 47X51 STRL (DRAPES) ×4 IMPLANT
DRSG MEPITEL 4X7.2 (GAUZE/BANDAGES/DRESSINGS) ×4 IMPLANT
DRSG PAD ABDOMINAL 8X10 ST (GAUZE/BANDAGES/DRESSINGS) ×4 IMPLANT
DRSG VAC ATS MED SENSATRAC (GAUZE/BANDAGES/DRESSINGS) ×4 IMPLANT
ELECT REM PT RETURN 9FT ADLT (ELECTROSURGICAL)
ELECTRODE REM PT RTRN 9FT ADLT (ELECTROSURGICAL) IMPLANT
FACESHIELD STD STERILE (MASK) ×4 IMPLANT
GAUZE XEROFORM 1X8 LF (GAUZE/BANDAGES/DRESSINGS) ×4 IMPLANT
GLOVE BIOGEL PI IND STRL 8.5 (GLOVE) ×2 IMPLANT
GLOVE BIOGEL PI INDICATOR 8.5 (GLOVE) ×2
GLOVE SURG ORTHO 8.5 STRL (GLOVE) ×4 IMPLANT
GOWN STRL REUS W/ TWL LRG LVL3 (GOWN DISPOSABLE) IMPLANT
GOWN STRL REUS W/TWL 2XL LVL3 (GOWN DISPOSABLE) ×4 IMPLANT
GOWN STRL REUS W/TWL LRG LVL3 (GOWN DISPOSABLE)
KIT BASIN OR (CUSTOM PROCEDURE TRAY) ×4 IMPLANT
KIT ROOM TURNOVER OR (KITS) ×4 IMPLANT
NS IRRIG 1000ML POUR BTL (IV SOLUTION) ×4 IMPLANT
PACK ORTHO EXTREMITY (CUSTOM PROCEDURE TRAY) ×4 IMPLANT
PAD ARMBOARD 7.5X6 YLW CONV (MISCELLANEOUS) ×8 IMPLANT
SPONGE LAP 18X18 X RAY DECT (DISPOSABLE) IMPLANT
SUT ETHILON 2 0 PSLX (SUTURE) ×4 IMPLANT
SUT ETHILON 3 0 FSL (SUTURE) ×8 IMPLANT
TOWEL OR 17X24 6PK STRL BLUE (TOWEL DISPOSABLE) ×4 IMPLANT
TOWEL OR 17X26 10 PK STRL BLUE (TOWEL DISPOSABLE) ×4 IMPLANT
TUBE CONNECTING 12'X1/4 (SUCTIONS) ×1
TUBE CONNECTING 12X1/4 (SUCTIONS) ×3 IMPLANT
UNDERPAD 30X30 INCONTINENT (UNDERPADS AND DIAPERS) ×4 IMPLANT
WATER STERILE IRR 1000ML POUR (IV SOLUTION) ×4 IMPLANT

## 2015-02-27 NOTE — ED Notes (Signed)
Returned from vascular. Report called to OR. Patient being transferred to OR. Belongings given to daughter

## 2015-02-27 NOTE — Progress Notes (Signed)
ANTIBIOTIC CONSULT NOTE - INITIAL  Pharmacy Consult for Vancomycin; Zosyn  Indication: Infected ankle with hardware  Allergies  Allergen Reactions  . Sulfa Antibiotics     Hives     Patient Measurements: Height:  (157.5 cm) Weight: 117 lb (53.071 kg) IBW/kg (Calculated) : 50.1 Vital Signs: Temp: 98.1 F (36.7 C) (01/27 0927) Temp Source: Oral (01/27 0927) BP: 182/76 mmHg (01/27 1645) Pulse Rate: 117 (01/27 1645) Intake/Output from this shift: Total I/O In: -  Out: 200 [Urine:200]  Labs:  Recent Labs  02/27/15 0939 02/27/15 1038  WBC 9.8  --   HGB 9.8*  --   PLT 534*  --   CREATININE  --  0.88   Estimated Creatinine Clearance: 47 mL/min (by C-G formula based on Cr of 0.88).  Microbiology: No results found for this or any previous visit (from the past 720 hour(s)).  Assessment: 70 year old female admitted with exposed hardware after ankle surgery in Oct 2016 s/p OR for debridement and removal of hardware. No osteomyelitis on CT scan. Patient received vancomycin 1g pre-operatively (though charted as 2g - confirmed with CRNA that patient only received 1g). CrCl ~ 47 mL/min.  Goal of Therapy:  Vancomycin trough level 10-15 mcg/ml  Clinical resolution of infection  Plan:  Vancomycin  IV every 12 hours - next dose at 0600 AM.  Zosyn 3.375g IV every 8 hours - 4hr infusion.  Monitor renal function, clinical status, and culture results.   Link Snuffer, PharmD, BCPS Clinical Pharmacist [phone]  02/27/2015,6:34 PM

## 2015-02-27 NOTE — ED Notes (Signed)
Surgeon at bedside.  

## 2015-02-27 NOTE — ED Notes (Signed)
Pt accompanied by daughter, for left hip pain and left foot pain, redness and yellow exudate. Patient had left ankle surgery Oct 2016 for fall with 9 screws and a plate placed. Patient had left hip surgery Nov 2016. Patient had been improving well at home and had boot removed two week ago. Patient was showering yesterday with assistance from son, who noted to visible screws in left ankle. Patients daughter came in today and felt patient was more sluggish and lethargic and noted facial swelling and yellow drainage to left ankle. Patients left ankle, pedal pulse is 2+,2 screws are visible on distal aspect with redness and small amounts of exudate.

## 2015-02-27 NOTE — Brief Op Note (Signed)
02/27/2015  7:44 PM  PATIENT:  [name]  70 y.o. female  PRE-OPERATIVE DIAGNOSIS:  infected left ankle hardware  POST-OPERATIVE DIAGNOSIS:  infected left ankle hardware  PROCEDURE:  Procedure(s): HARDWARE REMOVAL OF ANKLE AND I AND D (Left) APPLICATION OF WOUND VAC (Left)  SURGEON:  Surgeon(s) and Role:    * Samson Frederic, MD - Primary  PHYSICIAN ASSISTANT: none  ASSISTANTS: staff   ANESTHESIA:   general  EBL:  Total I/O In: 450 [I.V.:200; IV Piggyback:250] Out: -   BLOOD ADMINISTERED:none  DRAINS: none   LOCAL MEDICATIONS USED:  NONE  SPECIMEN:  Source of Specimen:  L ankle wound culture  DISPOSITION OF SPECIMEN:  micro  COUNTS:  YES  TOURNIQUET:  * No tourniquets in log *  DICTATION: .Other Dictation: Dictation Number [id number]  PLAN OF CARE: Admit to inpatient   PATIENT DISPOSITION:  PACU - hemodynamically stable.   Delay start of Pharmacological VTE agent (>24hrs) due to surgical blood loss or risk of bleeding: no

## 2015-02-27 NOTE — ED Notes (Signed)
[name] [phone] (call when surgeon gets here)

## 2015-02-27 NOTE — ED Provider Notes (Signed)
CSN: [id number]     Arrival date & time 02/27/15  0911 History   First MD Initiated Contact with Patient 02/27/15 [phone]     Chief Complaint  Patient presents with  . Foot Pain      HPI  Patient presents for evaluation of pain and possible infection in her left ankle, and pain in her left hip and? Facial swelling  Patient fell and had a calm alveolar ankle fracture repaired in [place] in October. Was at a rehabilitation facility. Family took her out on Thanksgiving day. She fell and fractured her left hip. Had this repaired here locally with Dr. Victorino Dike. Again went to rehabilitation facility.   States that on Monday, 4 days ago her left hip started becoming very painful to the point she can barely walk on it. Had been down to taking only Tylenol prior to that. No fall or new injury. Also states that she began noticing some redness and pain around her ankle fracture. Family states that they noted this screw heads were evident through a wound in her left lateral ankle since at least yesterday.  Daughter states that "and it looks like her face looks swollen". Her face is symmetric in its appearance. Her daughter states "it looks swollen the same on both sides".  No difficulty breathing. No history of CHF or renal disorders. Normal urine output. No dyspnea.  Past Medical History  Diagnosis Date  . Chicken pox   . Chest pain   . SOB (shortness of breath) on exertion   . Fall 06/15/2011    Caused discomfort in lower left rib and left lateral hip area  . H/O: hysterectomy 1985  . Depressive disorder   . Pre-syncope   . Dizziness   . OCD (obsessive compulsive disorder)   . Anxiety   . Opioid abuse   . Hypokalemia   . Severe sepsis (HCC)   . Anemia   . Azotemia   . Left trimalleolar fracture   . Respiratory failure (HCC)   . History of MRSA infection   . Leukocytosis    Past Surgical History  Procedure Laterality Date  . Gallbladder surgery    . Appendectomy    . Total  abdominal hysterectomy    . Intramedullary (im) nail intertrochanteric Left 12/25/2014    Procedure: INTRAMEDULLARY (IM) NAIL INTERTROCHANTRIC;  Surgeon: Toni Arthurs, MD;  Location: [facility] OR;  Service: Orthopedics;  Laterality: Left;   Family History  Problem Relation Age of Onset  . Heart attack Mother   . Colon cancer Father   . Diabetes Brother   . Heart attack Brother 40    MI   Social History  Substance Use Topics  . Smoking status: Never Smoker   . Smokeless tobacco: None  . Alcohol Use: No   OB History    No data available     Review of Systems  Constitutional: Negative for fever, chills, diaphoresis, appetite change and fatigue.  HENT: Negative for mouth sores, sore throat and trouble swallowing.   Eyes: Negative for visual disturbance.  Respiratory: Negative for cough, chest tightness, shortness of breath and wheezing.   Cardiovascular: Negative for chest pain.  Gastrointestinal: Negative for nausea, vomiting, abdominal pain, diarrhea and abdominal distention.  Endocrine: Negative for polydipsia, polyphagia and polyuria.  Genitourinary: Negative for dysuria, frequency and hematuria.  Musculoskeletal: Positive for joint swelling and arthralgias. Negative for gait problem.  Skin: Positive for color change and wound. Negative for pallor and rash.  Neurological: Negative for dizziness,  syncope, light-headedness and headaches.  Hematological: Does not bruise/bleed easily.  Psychiatric/Behavioral: Negative for behavioral problems and confusion.      Allergies  Sulfa antibiotics  Home Medications   Prior to Admission medications   Medication Sig Start Date End Date Taking? Authorizing Provider  aspirin 325 MG tablet Take 325 mg by mouth daily.   Yes Historical Provider, MD  calcium carbonate (OSCAL) 1500 (600 Ca) MG TABS tablet Take 600 mg of elemental calcium by mouth daily with breakfast.   Yes Historical Provider, MD  metoprolol tartrate (LOPRESSOR) 25 MG tablet  Take 12.5 mg by mouth 2 (two) times daily.    Yes Historical Provider, MD  pantoprazole (PROTONIX) 40 MG tablet Take 40 mg by mouth daily. Before breakfast   Yes Historical Provider, MD  sertraline (ZOLOFT) 100 MG tablet Take 100 mg by mouth daily.   Yes Historical Provider, MD  bisacodyl (DULCOLAX) 10 MG suppository Place 1 suppository (10 mg total) rectally daily as needed for moderate constipation. Patient not taking: Reported on 02/27/2015 12/28/14   Leroy Sea, MD  docusate sodium (COLACE) 100 MG capsule Take 1 capsule (100 mg total) by mouth 2 (two) times daily. Patient not taking: Reported on 02/27/2015 12/28/14   Leroy Sea, MD  enoxaparin (LOVENOX) 40 MG/0.4ML injection Inject 0.4 mLs (40 mg total) into the skin daily. Patient not taking: Reported on 02/27/2015 12/28/14   Leroy Sea, MD  LORazepam (ATIVAN) 0.5 MG tablet Take 1 tablet (0.5 mg total) by mouth every 6 (six) hours as needed for anxiety. Patient not taking: Reported on 02/27/2015 12/29/14   Kermit Balo, DO  oxyCODONE (OXY IR/ROXICODONE) 5 MG immediate release tablet Take one tablet by mouth every 6 hours as needed for severe pain Patient not taking: Reported on 02/27/2015 12/29/14   Tiffany L Reed, DO   BP 170/68 mmHg  Pulse 110  Temp(Src) 98.1 F (36.7 C) (Oral)  Resp 16  Ht  (1.575 m)  Wt 117 lb (53.071 kg)  BMI 21.39 kg/m2  SpO2 97% Physical Exam  Constitutional: She is oriented to person, place, and time. She appears well-developed and well-nourished. No distress.  HENT:  Head: Normocephalic.  No periorbital edema. Erythematous cheeks without cellulitis.  Eyes: Conjunctivae are normal. Pupils are equal, round, and reactive to light. No scleral icterus.  Neck: Normal range of motion. Neck supple. No thyromegaly present.  Cardiovascular: Normal rate and regular rhythm.  Exam reveals no gallop and no friction rub.   No murmur heard. Pulmonary/Chest: Effort normal and breath sounds normal. No  respiratory distress. She has no wheezes. She has no rales.  No crackles or rales.  Abdominal: Soft. Bowel sounds are normal. She exhibits no distension. There is no tenderness. There is no rebound.  Musculoskeletal: Normal range of motion.       Legs: Neurological: She is alert and oriented to person, place, and time.  Skin: Skin is warm and dry. No rash noted.  Psychiatric: She has a normal mood and affect. Her behavior is normal.    ED Course  Procedures (including critical care time) Labs Review Labs Reviewed  CBC WITH DIFFERENTIAL/PLATELET - Abnormal; Notable for the following:    RBC 3.42 (*)    Hemoglobin 9.8 (*)    HCT 29.8 (*)    RDW 17.6 (*)    Platelets 534 (*)    Neutro Abs 8.4 (*)    All other components within normal limits  BRAIN NATRIURETIC PEPTIDE - Abnormal;  Notable for the following:    B Natriuretic Peptide 272.4 (*)    All other components within normal limits  COMPREHENSIVE METABOLIC PANEL - Abnormal; Notable for the following:    Glucose, Bld 124 (*)    Calcium 8.7 (*)    Albumin 2.4 (*)    AST 85 (*)    Alkaline Phosphatase 497 (*)    All other components within normal limits  SEDIMENTATION RATE - Abnormal; Notable for the following:    Sed Rate 116 (*)    All other components within normal limits  C-REACTIVE PROTEIN - Abnormal; Notable for the following:    CRP 33.6 (*)    All other components within normal limits  I-STAT CG4 LACTIC ACID, ED - Abnormal; Notable for the following:    Lactic Acid, Venous 2.41 (*)    All other components within normal limits  CULTURE, BLOOD (ROUTINE X 2)  CULTURE, BLOOD (ROUTINE X 2)    Imaging Review Dg Chest 1 View  02/27/2015  CLINICAL DATA:  Shortness of breath with exertion. EXAM: CHEST 1 VIEW COMPARISON:  February 11, 2015 chest radiograph; chest CT December 25, 2014 FINDINGS: There is scarring in the right upper lobe and right base region. There is also scarring in the left base region. There is an  ill-defined nodular opacity in the left upper lobe measuring 1.6 x 1.6 cm. No edema or consolidation. Heart size and pulmonary vascularity are normal. No adenopathy. No bone lesions. IMPRESSION: Areas of scarring, stable. Ill-defined nodular opacity left upper lobe measuring 1.6 x 1.6 cm. On prior CT, there was consolidation in this area. It is possible that this current nodular opacity in the left upper lobe represents residua from that infiltrate. An underlying mass which remains is a differential consideration, however, and given this possibility, noncontrast enhanced chest CT at this time is warranted. Lungs elsewhere clear.  No change in cardiac silhouette. Electronically Signed   By: Bretta Bang III M.D.   On: 02/27/2015 10:52   Dg Ankle Complete Left  02/27/2015  CLINICAL DATA:  Redness swelling and pain about the left ankle, status post ORIF in October 2016. EXAM: LEFT ANKLE COMPLETE - 3+ VIEW COMPARISON:  None. FINDINGS: There has been a prior tri malleolar fracture fixation. There is diffuse osteopenia. The distal tibial screws are in good position. The lateral plate and screw fixation of the distal fibula is also with satisfactory radiologic positioning. There is no evidence of acute fracture. There is no evidence of radiographically apparent loosening. There is mild callus formation about the fracture lines. There is bilateral ankle soft tissue swelling. No soft tissue emphysema. IMPRESSION: Status post tri malleolar fracture fixation with normal appearance of the orthopedic hardware. Osteopenia. Mild callus formation at the fracture lines. No evidence of acute fracture. Bimalleolar soft tissue swelling without evidence of soft tissue emphysema. Electronically Signed   By: Ted Mcalpine M.D.   On: 02/27/2015 10:49   Ct Ankle Left Wo Contrast  02/27/2015  CLINICAL DATA:  Left foot pain with redness and exudate. Protruding fixation screws. Prior open reduction and internal fixation of  fractures of the distal tibia and fibula. EXAM: CT OF THE LEFT ANKLE WITHOUT CONTRAST TECHNIQUE: Multidetector CT imaging of the left ankle was performed according to the standard protocol. Multiplanar CT image reconstructions were also generated. COMPARISON:  Radiographs dated 02/27/2015 FINDINGS: There are old healed fractures of distal tibia and fibula. 3 screws in the distal tibia are in good position. Side plate and  multiple screws appear in good position in the distal tibia. No evidence of loosening of the screws. There is soft tissue edema anterior to the distal tibia and anterior lateral aspect of the proximal talus. No bone destruction to suggest osteomyelitis. No acute fractures. The tendons around the ankle appear normal. There is no ankle or subtalar joint effusion. The skin is quite thin over the second and third screws in the distal fibula counting from proximal to distal. These may be the exposed screw heads. IMPRESSION: No acute osseous abnormality. Slight soft tissue edema adjacent to the distal fibula and at the anterior lateral aspect the proximal talus just anterior to the fibula. No loosening of the hardware. The heads of the second and third screws in the distal tibia may be exposed due to break down of overlying skin. Electronically Signed   By: Francene Boyers M.D.   On: 02/27/2015 15:34   Dg Hip Unilat With Pelvis 2-3 Views Left  02/27/2015  CLINICAL DATA:  Left hip surgery in November. Throbbing left hip pain medially. EXAM: DG HIP (WITH OR WITHOUT PELVIS) 2-3V LEFT COMPARISON:  12/25/2014 FINDINGS: Remote left intertrochanteric hip fracture transfixed with a intra medullary nail and interlocking cannulated femoral neck screw. Ununited displaced lesser trochanteric fracture fragment. Subtle lucency in the proximal left femur in the region of the lesser trochanter fracture which may be artifactual secondary to overlapping lesser trochanteric fracture fragment versus incomplete healing. No  other fracture or dislocation. No hardware failure or complication. Mild osteoarthritis of the left hip. IMPRESSION: 1. Remote left intertrochanteric hip fracture transfixed with a intra medullary nail and interlocking cannulated femoral neck screw. Ununited displaced lesser trochanteric fracture fragment. Subtle lucency in the proximal left femur in the region of the lesser trochanter fracture which may be artifactual secondary to overlapping lesser trochanteric fracture fragment versus incomplete healing. Electronically Signed   By: Elige Ko   On: 02/27/2015 10:51   I have personally reviewed and evaluated these images and lab results as part of my medical decision-making.   EKG Interpretation None      MDM   Final diagnoses:  Wound cellulitis  Wound dehiscence, initial encounter    Past with Drs. contact of orthopedics, as well as [facility] hospitalist. Patient will be admitted for hardware removal and washout of the infected exposed hardware.  Patient not given antibiotics in the emergency with requested Dr. Venancio Poisson [facility] who preferred to obtain cultures intraoperatively prior to a monitored menstruation.    Rolland Porter, MD 02/27/15 [phone]

## 2015-02-27 NOTE — Anesthesia Postprocedure Evaluation (Signed)
Anesthesia Post Note  Patient: [name]  Procedure(s) Performed: Procedure(s) (LRB): HARDWARE REMOVAL OF ANKLE AND I AND D (Left) APPLICATION OF WOUND VAC (Left)  Patient location during evaluation: PACU Anesthesia Type: General Level of consciousness: awake Pain management: pain level controlled Vital Signs Assessment: post-procedure vital signs reviewed and stable Respiratory status: spontaneous breathing Cardiovascular status: stable Anesthetic complications: no    Last Vitals:  Filed Vitals:   02/27/15 2013 02/27/15 2023  BP:  165/71  Pulse: 112 119  Temp:    Resp: 18 17    Last Pain:  Filed Vitals:   02/27/15 2027  PainSc: 8                  EDWARDS,Jaxiel Kines

## 2015-02-27 NOTE — H&P (Signed)
[facility] History and Physical  [name] AVW:[id number] DOB: 09-Dec-1945 DOA: 02/27/2015  Referring physician: Emergency Department PCP: [facility]   CHIEF COMPLAINT:    Left hip and left ankle pain              HPI: [name] is a 70 y.o. female who fractured her left ankle in [place] in the Fall. She was then admitted here late November for a left femur fracture following a fall while on a family outing. She underwent intramedullary nailing 12/25/14 and subsequently discharged to rehab facility. Patient was doing well without pain until several days ago when she develped pain in left hip as well as redness and pain of left ankle. Surgical boot removed on Monday but it wasn't until today that her son noticed that the surgical site of left ankle had opened up and was exposing two screws. CTscan reveals slight soft tissue edema adjacent to the distal fibula and at the anterior lateral aspect the proximal talus just anterior to the fibula. No loosening of the hardware  Patient denies fever / chills. She has some loose stools recently. No other medical complaints  ED COURSE:           Labs:   Normal sodium, potassium, and renal function. Alcohol phosphatase 497, up from 129 late November. CRP 33, sedimentation rate was 16 . lactic acid 2.41 Wbc 9.8. Hemoglobin 9.8.   Ortho called by EDP. Will take patient to OR today  CXR:    No acute abnormalities. There is still an ill-defined nodular opacity in the left upper lobe that was seen on last x-ray.            Medications  vancomycin (VANCOCIN) IVPB 1000 mg/200 mL premix (not administered)  ondansetron (ZOFRAN) injection 4 mg (4 mg Intravenous Given 02/27/15 0937)  morphine 4 MG/ML injection 4 mg (4 mg Intravenous Given 02/27/15 1427)  metoprolol tartrate (LOPRESSOR) tablet 25 mg (25 mg Oral Given 02/27/15 1251)   Review of Systems  Constitutional: Negative.   HENT: Negative.   Eyes: Negative.     Cardiovascular: Negative.   Gastrointestinal: Negative.   Genitourinary: Negative.   Musculoskeletal: Positive for joint pain.  Neurological: Negative.   Endo/Heme/Allergies: Negative.   Psychiatric/Behavioral: Negative.    Past Medical History  Diagnosis Date  . Chicken pox   . Fall 06/15/2011    Caused discomfort in lower left rib and left lateral hip area  . H/O: hysterectomy 1985  . Depressive disorder   . Dizziness   . OCD (obsessive compulsive disorder)   . Anxiety   . Opioid abuse   . Severe sepsis ([place])   . Anemia   . Left trimalleolar fracture   . Respiratory failure ([place])   . History of MRSA infection    Past Surgical History  Procedure Laterality Date  . Gallbladder surgery    . Appendectomy    . Total abdominal hysterectomy    . Intramedullary (im) nail intertrochanteric Left 12/25/2014    Procedure: INTRAMEDULLARY (IM) NAIL INTERTROCHANTRIC;  Surgeon: Wylene Simmer, MD;  Location: [facility];  Service: Orthopedics;  Laterality: Left;    SOCIAL HISTORY:  reports that she has never smoked. She does not have any smokeless tobacco history on file. She reports that she does not drink alcohol or use illicit drugs. Lives:  At home with family   Assistive devices:  walker needed for ambulation.   Allergies  Allergen Reactions  . Sulfa  Antibiotics     Hives     Family History  Problem Relation Age of Onset  . Heart attack Mother   . Colon cancer Father   . Diabetes Brother   . Heart attack Brother 39    MI    Prior to Admission medications   Medication Sig Start Date End Date Taking? Authorizing Provider  aspirin 325 MG tablet Take 325 mg by mouth daily.   Yes Historical Provider, MD  calcium carbonate (OSCAL) 1500 (600 Ca) MG TABS tablet Take 600 mg of elemental calcium by mouth daily with breakfast.   Yes Historical Provider, MD  metoprolol tartrate (LOPRESSOR) 25 MG tablet Take 12.5 mg by mouth 2 (two) times daily.    Yes Historical Provider, MD  pantoprazole  (PROTONIX) 40 MG tablet Take 40 mg by mouth daily. Before breakfast   Yes Historical Provider, MD  sertraline (ZOLOFT) 100 MG tablet Take 100 mg by mouth daily.   Yes Historical Provider, MD  bisacodyl (DULCOLAX) 10 MG suppository Place 1 suppository (10 mg total) rectally daily as needed for moderate constipation. Patient not taking: Reported on 02/27/2015 12/28/14   Thurnell Lose, MD  docusate sodium (COLACE) 100 MG capsule Take 1 capsule (100 mg total) by mouth 2 (two) times daily. Patient not taking: Reported on 02/27/2015 12/28/14   Thurnell Lose, MD  LORazepam (ATIVAN) 0.5 MG tablet Take 1 tablet (0.5 mg total) by mouth every 6 (six) hours as needed for anxiety. Patient not taking: Reported on 02/27/2015 12/29/14   Gayland Curry, DO   PHYSICAL EXAM: Filed Vitals:   02/27/15 1545 02/27/15 1600 02/27/15 1630 02/27/15 1645  BP: 172/72 170/68 173/70 182/76  Pulse: 108 110 115 117  Temp:      TempSrc:      Resp:  '16 16 16  ' Height:      Weight:      SpO2: 97% 97% 99% 92%    Wt Readings from Last 3 Encounters:  02/27/15 53.071 kg (117 lb)  12/29/14 51.256 kg (113 lb)  12/25/14 51.256 kg (113 lb)    General:  Pleasant white female. Appears calm and comfortable Eyes: PER, normal lids, irises & conjunctiva ENT: grossly normal hearing, lips & tongue Neck: no LAD, no masses Cardiovascular: sinus tachycardia. Lateral left ankle erythematous, edematous with open area exposing two screws. Positive pedal pulses bilaterally Respiratory: Respirations even and unlabored. Normal respiratory effort. Lungs CTA bilaterally, no wheezes / rales .   Abdomen: soft, non-distended, non-tender, active bowel sounds. No obvious masses.  Skin: no rash seen on limited exam Musculoskeletal: grossly normal tone BUE/BLE Psychiatric: grossly normal mood and affect, speech fluent and appropriate Neurologic: grossly non-focal.         LABS ON ADMISSION:    Basic Metabolic Panel:  Recent Labs Lab  02/27/15 1038  NA 141  K 3.5  CL 105  CO2 22  GLUCOSE 124*  BUN 14  CREATININE 0.88  CALCIUM 8.7*   Liver Function Tests:  Recent Labs Lab 02/27/15 1038  AST 85*  ALT 37  ALKPHOS 497*  BILITOT 0.6  PROT 6.5  ALBUMIN 2.4*    CBC:  Recent Labs Lab 02/27/15 0939  WBC 9.8  NEUTROABS 8.4*  HGB 9.8*  HCT 29.8*  MCV 87.1  PLT 534*    BNP (last 3 results)  Recent Labs  02/27/15 1038  BNP 272.4*    CREATININE: 0.88 (02/27/15 1038) Estimated creatinine clearance - 47 mL/min  Radiological Exams on  Admission: Dg Chest 1 View  02/27/2015  CLINICAL DATA:  Shortness of breath with exertion. EXAM: CHEST 1 VIEW COMPARISON:  February 11, 2015 chest radiograph; chest CT December 25, 2014 FINDINGS: There is scarring in the right upper lobe and right base region. There is also scarring in the left base region. There is an ill-defined nodular opacity in the left upper lobe measuring 1.6 x 1.6 cm. No edema or consolidation. Heart size and pulmonary vascularity are normal. No adenopathy. No bone lesions. IMPRESSION: Areas of scarring, stable. Ill-defined nodular opacity left upper lobe measuring 1.6 x 1.6 cm. On prior CT, there was consolidation in this area. It is possible that this current nodular opacity in the left upper lobe represents residua from that infiltrate. An underlying mass which remains is a differential consideration, however, and given this possibility, noncontrast enhanced chest CT at this time is warranted. Lungs elsewhere clear.  No change in cardiac silhouette. Electronically Signed   By: Lowella Grip III M.D.   On: 02/27/2015 10:52   Dg Ankle Complete Left  02/27/2015  CLINICAL DATA:  Redness swelling and pain about the left ankle, status post ORIF in October 2016. EXAM: LEFT ANKLE COMPLETE - 3+ VIEW COMPARISON:  None. FINDINGS: There has been a prior tri malleolar fracture fixation. There is diffuse osteopenia. The distal tibial screws are in good position.  The lateral plate and screw fixation of the distal fibula is also with satisfactory radiologic positioning. There is no evidence of acute fracture. There is no evidence of radiographically apparent loosening. There is mild callus formation about the fracture lines. There is bilateral ankle soft tissue swelling. No soft tissue emphysema. IMPRESSION: Status post tri malleolar fracture fixation with normal appearance of the orthopedic hardware. Osteopenia. Mild callus formation at the fracture lines. No evidence of acute fracture. Bimalleolar soft tissue swelling without evidence of soft tissue emphysema. Electronically Signed   By: Fidela Salisbury M.D.   On: 02/27/2015 10:49   Ct Ankle Left Wo Contrast  02/27/2015  CLINICAL DATA:  Left foot pain with redness and exudate. Protruding fixation screws. Prior open reduction and internal fixation of fractures of the distal tibia and fibula. EXAM: CT OF THE LEFT ANKLE WITHOUT CONTRAST TECHNIQUE: Multidetector CT imaging of the left ankle was performed according to the standard protocol. Multiplanar CT image reconstructions were also generated. COMPARISON:  Radiographs dated 02/27/2015 FINDINGS: There are old healed fractures of distal tibia and fibula. 3 screws in the distal tibia are in good position. Side plate and multiple screws appear in good position in the distal tibia. No evidence of loosening of the screws. There is soft tissue edema anterior to the distal tibia and anterior lateral aspect of the proximal talus. No bone destruction to suggest osteomyelitis. No acute fractures. The tendons around the ankle appear normal. There is no ankle or subtalar joint effusion. The skin is quite thin over the second and third screws in the distal fibula counting from proximal to distal. These may be the exposed screw heads. IMPRESSION: No acute osseous abnormality. Slight soft tissue edema adjacent to the distal fibula and at the anterior lateral aspect the proximal talus  just anterior to the fibula. No loosening of the hardware. The heads of the second and third screws in the distal tibia may be exposed due to break down of overlying skin. Electronically Signed   By: Lorriane Shire M.D.   On: 02/27/2015 15:34   Dg Hip Unilat With Pelvis 2-3 Views Left  02/27/2015  CLINICAL DATA:  Left hip surgery in November. Throbbing left hip pain medially. EXAM: DG HIP (WITH OR WITHOUT PELVIS) 2-3V LEFT COMPARISON:  12/25/2014 FINDINGS: Remote left intertrochanteric hip fracture transfixed with a intra medullary nail and interlocking cannulated femoral neck screw. Ununited displaced lesser trochanteric fracture fragment. Subtle lucency in the proximal left femur in the region of the lesser trochanter fracture which may be artifactual secondary to overlapping lesser trochanteric fracture fragment versus incomplete healing. No other fracture or dislocation. No hardware failure or complication. Mild osteoarthritis of the left hip. IMPRESSION: 1. Remote left intertrochanteric hip fracture transfixed with a intra medullary nail and interlocking cannulated femoral neck screw. Ununited displaced lesser trochanteric fracture fragment. Subtle lucency in the proximal left femur in the region of the lesser trochanter fracture which may be artifactual secondary to overlapping lesser trochanteric fracture fragment versus incomplete healing. Electronically Signed   By: Kathreen Devoid   On: 02/27/2015 10:51    ASSESSMENT / PLAN   Wound dehiscence of left ankle wound with cellulitis. No osteomyleitis on CTscan. Had ABIs today. Bilateral ABIs are within normal limits. The right TBI is within normal limits. The left toe brachial index is abnormal. -admit to telemetry -Orthopedics taking to OR this evening. -Eventual PT consult  -IV Vancomycin per pharmacy   Sinus tachycardia. Evaluated by Cardiology in November.  -continue home beta blocker  Elevated lactic acid. Suspect from dehydration. Low  suspicion for sepsis.  Depression. -continue home Zoloft  History of grade I diastolic dysfunction on echo July 2016. No evidence for acute decompensation at present.   Elevated alk phos. Normal in November when here for fracture. Etiology not clear. No osteomyelitis on CTscan.  She has a mildly elevated AST as well. Remainder of LFTs normal.  -am LFTs   CONSULTANTS:    Orthopedics  Code Status: full code DVT Prophylaxis: Lovenox Family Communication:  Patient alert, oriented and understands plan of care.  Disposition Plan: Discharge home or to Rehab facility in 3-4 days.  Time spent: 60 minutes Tye Savoy  NP [facility] Pager [phone]

## 2015-02-27 NOTE — Consult Note (Signed)
ORTHOPAEDIC CONSULTATION  REQUESTING PHYSICIAN: Rolland Porter, MD  PCP:  [facility]  Chief Complaint: left ankle wound with exposed hardware  HPI: [name] is a 70 y.o. female who underwent ORIF L ankle fx 11/2014 in [place]. Yesterday, she noticed exposed hardware after taking a shower. She has developed increasing ankle pain, swelling, and erythema since then. Also h/o IM nail L IT fx by Dr. Victorino Dike 12/25/2014 with chronic trochanteric bursitis. Came to ED today due to exposed hardware.  Past Medical History  Diagnosis Date  . Chicken pox   . Chest pain   . SOB (shortness of breath) on exertion   . Fall 06/15/2011    Caused discomfort in lower left rib and left lateral hip area  . H/O: hysterectomy 1985  . Depressive disorder   . Pre-syncope   . Dizziness   . OCD (obsessive compulsive disorder)   . Anxiety   . Opioid abuse   . Hypokalemia   . Severe sepsis ([facility])   . Anemia   . Azotemia   . Left trimalleolar fracture   . Respiratory failure ([facility])   . History of MRSA infection   . Leukocytosis    Past Surgical History  Procedure Laterality Date  . Gallbladder surgery    . Appendectomy    . Total abdominal hysterectomy    . Intramedullary (im) nail intertrochanteric Left 12/25/2014    Procedure: INTRAMEDULLARY (IM) NAIL INTERTROCHANTRIC;  Surgeon: Toni Arthurs, MD;  Location: [facility];  Service: Orthopedics;  Laterality: Left;   Social History   Social History  . Marital Status: Widowed    Spouse Name: N/A  . Number of Children: 3  . Years of Education: N/A   Occupational History  . retail-retired    Social History Main Topics  . Smoking status: Never Smoker   . Smokeless tobacco: None  . Alcohol Use: No  . Drug Use: No  . Sexual Activity: Not Asked   Other Topics Concern  . None   Social History Narrative   Family History  Problem Relation Age of Onset  . Heart attack Mother   . Colon cancer Father   . Diabetes Brother    . Heart attack Brother 49    MI   Allergies  Allergen Reactions  . Sulfa Antibiotics     Hives    Prior to Admission medications   Medication Sig Start Date End Date Taking? Authorizing Provider  aspirin 325 MG tablet Take 325 mg by mouth daily.   Yes Historical Provider, MD  calcium carbonate (OSCAL) 1500 (600 Ca) MG TABS tablet Take 600 mg of elemental calcium by mouth daily with breakfast.   Yes Historical Provider, MD  metoprolol tartrate (LOPRESSOR) 25 MG tablet Take 12.5 mg by mouth 2 (two) times daily.    Yes Historical Provider, MD  pantoprazole (PROTONIX) 40 MG tablet Take 40 mg by mouth daily. Before breakfast   Yes Historical Provider, MD  sertraline (ZOLOFT) 100 MG tablet Take 100 mg by mouth daily.   Yes Historical Provider, MD  bisacodyl (DULCOLAX) 10 MG suppository Place 1 suppository (10 mg total) rectally daily as needed for moderate constipation. Patient not taking: Reported on 02/27/2015 12/28/14   Leroy Sea, MD  docusate sodium (COLACE) 100 MG capsule Take 1 capsule (100 mg total) by mouth 2 (two) times daily. Patient not taking: Reported on 02/27/2015 12/28/14   Leroy Sea, MD  enoxaparin (LOVENOX) 40 MG/0.4ML injection Inject 0.4  mLs (40 mg total) into the skin daily. Patient not taking: Reported on 02/27/2015 12/28/14   Leroy Sea, MD  LORazepam (ATIVAN) 0.5 MG tablet Take 1 tablet (0.5 mg total) by mouth every 6 (six) hours as needed for anxiety. Patient not taking: Reported on 02/27/2015 12/29/14   Kermit Balo, DO  oxyCODONE (OXY IR/ROXICODONE) 5 MG immediate release tablet Take one tablet by mouth every 6 hours as needed for severe pain Patient not taking: Reported on 02/27/2015 12/29/14   Kermit Balo, DO   Dg Chest 1 View  02/27/2015  CLINICAL DATA:  Shortness of breath with exertion. EXAM: CHEST 1 VIEW COMPARISON:  February 11, 2015 chest radiograph; chest CT December 25, 2014 FINDINGS: There is scarring in the right upper lobe and right  base region. There is also scarring in the left base region. There is an ill-defined nodular opacity in the left upper lobe measuring 1.6 x 1.6 cm. No edema or consolidation. Heart size and pulmonary vascularity are normal. No adenopathy. No bone lesions. IMPRESSION: Areas of scarring, stable. Ill-defined nodular opacity left upper lobe measuring 1.6 x 1.6 cm. On prior CT, there was consolidation in this area. It is possible that this current nodular opacity in the left upper lobe represents residua from that infiltrate. An underlying mass which remains is a differential consideration, however, and given this possibility, noncontrast enhanced chest CT at this time is warranted. Lungs elsewhere clear.  No change in cardiac silhouette. Electronically Signed   By: Bretta Bang III M.D.   On: 02/27/2015 10:52   Dg Ankle Complete Left  02/27/2015  CLINICAL DATA:  Redness swelling and pain about the left ankle, status post ORIF in October 2016. EXAM: LEFT ANKLE COMPLETE - 3+ VIEW COMPARISON:  None. FINDINGS: There has been a prior tri malleolar fracture fixation. There is diffuse osteopenia. The distal tibial screws are in good position. The lateral plate and screw fixation of the distal fibula is also with satisfactory radiologic positioning. There is no evidence of acute fracture. There is no evidence of radiographically apparent loosening. There is mild callus formation about the fracture lines. There is bilateral ankle soft tissue swelling. No soft tissue emphysema. IMPRESSION: Status post tri malleolar fracture fixation with normal appearance of the orthopedic hardware. Osteopenia. Mild callus formation at the fracture lines. No evidence of acute fracture. Bimalleolar soft tissue swelling without evidence of soft tissue emphysema. Electronically Signed   By: Ted Mcalpine M.D.   On: 02/27/2015 10:49   Ct Ankle Left Wo Contrast  02/27/2015  CLINICAL DATA:  Left foot pain with redness and exudate.  Protruding fixation screws. Prior open reduction and internal fixation of fractures of the distal tibia and fibula. EXAM: CT OF THE LEFT ANKLE WITHOUT CONTRAST TECHNIQUE: Multidetector CT imaging of the left ankle was performed according to the standard protocol. Multiplanar CT image reconstructions were also generated. COMPARISON:  Radiographs dated 02/27/2015 FINDINGS: There are old healed fractures of distal tibia and fibula. 3 screws in the distal tibia are in good position. Side plate and multiple screws appear in good position in the distal tibia. No evidence of loosening of the screws. There is soft tissue edema anterior to the distal tibia and anterior lateral aspect of the proximal talus. No bone destruction to suggest osteomyelitis. No acute fractures. The tendons around the ankle appear normal. There is no ankle or subtalar joint effusion. The skin is quite thin over the second and third screws in the distal fibula  counting from proximal to distal. These may be the exposed screw heads. IMPRESSION: No acute osseous abnormality. Slight soft tissue edema adjacent to the distal fibula and at the anterior lateral aspect the proximal talus just anterior to the fibula. No loosening of the hardware. The heads of the second and third screws in the distal tibia may be exposed due to break down of overlying skin. Electronically Signed   By: Francene Boyers M.D.   On: 02/27/2015 15:34   Dg Hip Unilat With Pelvis 2-3 Views Left  02/27/2015  CLINICAL DATA:  Left hip surgery in November. Throbbing left hip pain medially. EXAM: DG HIP (WITH OR WITHOUT PELVIS) 2-3V LEFT COMPARISON:  12/25/2014 FINDINGS: Remote left intertrochanteric hip fracture transfixed with a intra medullary nail and interlocking cannulated femoral neck screw. Ununited displaced lesser trochanteric fracture fragment. Subtle lucency in the proximal left femur in the region of the lesser trochanter fracture which may be artifactual secondary to  overlapping lesser trochanteric fracture fragment versus incomplete healing. No other fracture or dislocation. No hardware failure or complication. Mild osteoarthritis of the left hip. IMPRESSION: 1. Remote left intertrochanteric hip fracture transfixed with a intra medullary nail and interlocking cannulated femoral neck screw. Ununited displaced lesser trochanteric fracture fragment. Subtle lucency in the proximal left femur in the region of the lesser trochanter fracture which may be artifactual secondary to overlapping lesser trochanteric fracture fragment versus incomplete healing. Electronically Signed   By: Elige Ko   On: 02/27/2015 10:51    Positive ROS: All other systems have been reviewed and were otherwise negative with the exception of those mentioned in the HPI and as above.  Physical Exam: General: Alert, no acute distress Cardiovascular: No pedal edema Respiratory: No cyanosis, no use of accessory musculature GI: No organomegaly, abdomen is soft and non-tender Skin: No lesions in the area of chief complaint Neurologic: Sensation intact distally Psychiatric: Patient is competent for consent with normal mood and affect Lymphatic: No axillary or cervical lymphadenopathy  MUSCULOSKELETAL: L ankle with 3 exposed screw heads over lateral incision; (+) periwound erythema and seropurulent drainage. Medial and anterior incisions healed without evidence of infection. Ankle very stiff. 2+ DP pulse. Describes subjective sensory change over entire foot.   L hip incisions healed. (+) TTP trochanter. Painless logroll hip.  Assessment: 1. Infected L ankle wound with retained hardware 2. Trochanteric bursitis, L s/p recent IM nail  Plan: Admit to hospitalist NPO Obtain ABIs To OR for L ankle debridement and fibula HWR, VAC placement. Will obtain cultures and then give IV abx Patient understands she will need additional surgery and may require amputation / coverage procedures in the  future    Linna Caprice Cloyde Reams, MD Cell [phone]    02/27/2015 5:27 PM

## 2015-02-27 NOTE — Progress Notes (Signed)
Okay to give IV Vancomycin now per DR. Swinteck.

## 2015-02-27 NOTE — Anesthesia Procedure Notes (Signed)
Procedure Name: LMA Insertion Date/Time: 02/27/2015 6:44 PM Performed by: Wray Kearns A Pre-anesthesia Checklist: Patient identified, Timeout performed, Emergency Drugs available, Suction available and Patient being monitored Patient Re-evaluated:Patient Re-evaluated prior to inductionOxygen Delivery Method: Circle system utilized Preoxygenation: Pre-oxygenation with 100% oxygen Intubation Type: IV induction Ventilation: Mask ventilation without difficulty LMA: LMA inserted LMA Size: 4.0 Tube type: Oral Number of attempts: 1 Placement Confirmation: breath sounds checked- equal and bilateral and positive ETCO2 Tube secured with: Tape Dental Injury: Teeth and Oropharynx as per pre-operative assessment

## 2015-02-27 NOTE — Anesthesia Preprocedure Evaluation (Addendum)
Anesthesia Evaluation  Patient identified by MRN, date of birth, ID band Patient awake    Reviewed: Allergy & Precautions, NPO status , Patient's Chart, lab work & pertinent test results  Airway Mallampati: II  TM Distance: >3 FB Neck ROM: Full    Dental no notable dental hx.    Pulmonary shortness of breath, pneumonia,    Pulmonary exam normal breath sounds clear to auscultation       Cardiovascular hypertension, Pt. on medications and Pt. on home beta blockers + angina Normal cardiovascular exam Rhythm:Regular Rate:Normal  Left ventricle: The cavity size was normal. Wall thickness was normal. Systolic function was normal. The estimated ejection fraction was in the range of 60% to 65%. Wall motion was normal; there were no regional wall motion abnormalities. Doppler parameters are consistent with abnormal left ventricular relaxation (grade 1 diastolic dysfunction). - Mitral valve: Calcified annulus. 2016   Neuro/Psych negative neurological ROS  negative psych ROS   GI/Hepatic Neg liver ROS, GERD  Medicated,  Endo/Other  negative endocrine ROS  Renal/GU Renal diseasenegative Renal ROS  negative genitourinary   Musculoskeletal negative musculoskeletal ROS (+)   Abdominal   Peds negative pediatric ROS (+)  Hematology negative hematology ROS (+) anemia ,   Anesthesia Other Findings   Reproductive/Obstetrics negative OB ROS                           Anesthesia Physical Anesthesia Plan  ASA: II  Anesthesia Plan: General   Post-op Pain Management:    Induction: Intravenous  Airway Management Planned: LMA  Additional Equipment:   Intra-op Plan:   Post-operative Plan: Extubation in OR  Informed Consent: I have reviewed the patients History and Physical, chart, labs and discussed the procedure including the risks, benefits and alternatives for the proposed anesthesia with  the patient or authorized representative who has indicated his/her understanding and acceptance.   Dental advisory given  Plan Discussed with: CRNA and Anesthesiologist  Anesthesia Plan Comments:        Anesthesia Quick Evaluation

## 2015-02-27 NOTE — Transfer of Care (Signed)
Immediate Anesthesia Transfer of Care Note  Patient: [name]  Procedure(s) Performed: Procedure(s): HARDWARE REMOVAL OF ANKLE AND I AND D (Left) APPLICATION OF WOUND VAC (Left)  Patient Location: PACU  Anesthesia Type:General  Level of Consciousness: awake, oriented, sedated, patient cooperative and responds to stimulation  Airway & Oxygen Therapy: Patient Spontanous Breathing and Patient connected to nasal cannula oxygen  Post-op Assessment: Report given to RN, Post -op Vital signs reviewed and stable, Patient moving all extremities and Patient moving all extremities X 4  Post vital signs: Reviewed and stable  Last Vitals:  Filed Vitals:   02/27/15 1630 02/27/15 1645  BP: 173/70 182/76  Pulse: 115 117  Temp:    Resp: 16 16    Complications: No apparent anesthesia complications

## 2015-02-27 NOTE — Progress Notes (Signed)
VASCULAR LAB PRELIMINARY  ARTERIAL  ABI completed:    RIGHT    LEFT    PRESSURE WAVEFORM  PRESSURE WAVEFORM  BRACHIAL 207 Biphasic BRACHIAL 203 Monophasic  DP 222 Triphasic DP 243 Biphasic  AT   AT    PT 252 Triphasic PT 253 Biphasic  PER   PER    GREAT TOE 188 NA GREAT TOE 117 NA    RIGHT LEFT  ABI 1.22 1.22  TBI 0.91 0.57   Bilateral ABIs are within normal limits. The right TBI is within normal limits. The left TBI is abnormal.  02/27/2015 5:43 PM Gertie Fey, RVT, RDCS, RDMS

## 2015-02-28 ENCOUNTER — Inpatient Hospital Stay (HOSPITAL_COMMUNITY): Payer: Medicare Other

## 2015-02-28 DIAGNOSIS — L0390 Cellulitis, unspecified: Secondary | ICD-10-CM

## 2015-02-28 DIAGNOSIS — T814XXA Infection following a procedure, initial encounter: Secondary | ICD-10-CM

## 2015-02-28 DIAGNOSIS — B9689 Other specified bacterial agents as the cause of diseases classified elsewhere: Secondary | ICD-10-CM

## 2015-02-28 LAB — BASIC METABOLIC PANEL
ANION GAP: 10 (ref 5–15)
BUN: 11 mg/dL (ref 6–20)
CALCIUM: 8.6 mg/dL — AB (ref 8.9–10.3)
CO2: 26 mmol/L (ref 22–32)
Chloride: 104 mmol/L (ref 101–111)
Creatinine, Ser: 0.77 mg/dL (ref 0.44–1.00)
GLUCOSE: 161 mg/dL — AB (ref 65–99)
POTASSIUM: 3.3 mmol/L — AB (ref 3.5–5.1)
SODIUM: 140 mmol/L (ref 135–145)

## 2015-02-28 LAB — CBC
HEMATOCRIT: 25.8 % — AB (ref 36.0–46.0)
HEMOGLOBIN: 8.2 g/dL — AB (ref 12.0–15.0)
MCH: 27.9 pg (ref 26.0–34.0)
MCHC: 31.8 g/dL (ref 30.0–36.0)
MCV: 87.8 fL (ref 78.0–100.0)
Platelets: 439 10*3/uL — ABNORMAL HIGH (ref 150–400)
RBC: 2.94 MIL/uL — AB (ref 3.87–5.11)
RDW: 17.2 % — ABNORMAL HIGH (ref 11.5–15.5)
WBC: 9.5 10*3/uL (ref 4.0–10.5)

## 2015-02-28 LAB — IRON AND TIBC
Iron: 8 ug/dL — ABNORMAL LOW (ref 28–170)
SATURATION RATIOS: 7 % — AB (ref 10.4–31.8)
TIBC: 116 ug/dL — AB (ref 250–450)
UIBC: 108 ug/dL

## 2015-02-28 LAB — LACTIC ACID, PLASMA: LACTIC ACID, VENOUS: 0.6 mmol/L (ref 0.5–2.0)

## 2015-02-28 LAB — RETICULOCYTES: RBC.: 2.6 MIL/uL — ABNORMAL LOW (ref 3.87–5.11)

## 2015-02-28 LAB — VITAMIN B12: Vitamin B-12: 633 pg/mL (ref 180–914)

## 2015-02-28 LAB — FOLATE: Folate: 14.4 ng/mL (ref >5.9)

## 2015-02-28 LAB — FERRITIN: FERRITIN: 568 ng/mL — AB (ref 11–307)

## 2015-02-28 LAB — TSH: TSH: 0.959 u[IU]/mL (ref 0.350–4.500)

## 2015-02-28 MED ORDER — MORPHINE SULFATE (PF) 2 MG/ML IV SOLN
2.0000 mg | INTRAVENOUS | Status: AC | PRN
  Administered 2015-02-28 (×3): 2 mg via INTRAVENOUS
  Filled 2015-02-28 (×3): qty 1

## 2015-02-28 MED ORDER — GADOBENATE DIMEGLUMINE 529 MG/ML IV SOLN
10.0000 mL | Freq: Once | INTRAVENOUS | Status: AC | PRN
  Administered 2015-02-28: 10 mL via INTRAVENOUS

## 2015-02-28 MED ORDER — CEFAZOLIN SODIUM-DEXTROSE 2-3 GM-% IV SOLR
2.0000 g | Freq: Three times a day (TID) | INTRAVENOUS | Status: DC
  Administered 2015-02-28 – 2015-03-04 (×13): 2 g via INTRAVENOUS
  Filled 2015-02-28 (×15): qty 50

## 2015-02-28 MED ORDER — OXYCODONE HCL 5 MG PO TABS: 5.0000 mg | ORAL_TABLET | ORAL | Status: DC | PRN

## 2015-02-28 MED ORDER — OXYCODONE HCL 5 MG PO TABS
5.0000 mg | ORAL_TABLET | ORAL | Status: DC | PRN
  Administered 2015-02-28 – 2015-03-03 (×14): 10 mg via ORAL
  Filled 2015-02-28 (×14): qty 2

## 2015-02-28 MED ORDER — SODIUM CHLORIDE 0.9 % IV BOLUS (SEPSIS)
250.0000 mL | Freq: Once | INTRAVENOUS | Status: DC
Start: 2015-02-28 — End: 2015-03-04

## 2015-02-28 MED ORDER — OXYCODONE HCL 5 MG PO TABS: 10.0000 mg | ORAL_TABLET | ORAL | Status: DC | PRN

## 2015-02-28 MED ORDER — POTASSIUM CHLORIDE IN NACL 20-0.9 MEQ/L-% IV SOLN
INTRAVENOUS | Status: DC
  Administered 2015-02-28 – 2015-03-01 (×2): via INTRAVENOUS
  Filled 2015-02-28 (×2): qty 1000

## 2015-02-28 NOTE — Progress Notes (Signed)
OT Cancellation Note  Patient Details Name: MADHAVI HAMBLEN MRN: [id number] DOB: 13-Jun-1945   Cancelled Treatment:    Reason Eval/Treat Not Completed: Pain limiting ability to participate - will reattempt as schedule allows (pain 9/10)  Angelene Giovanni Helen, OTR/L [phone]  02/28/2015, 9:40 AM

## 2015-02-28 NOTE — Op Note (Signed)
NAMEDELORESE, SELLIN NO.:  [id number]  MEDICAL RECORD NO.:  [id number]  LOCATION:                               FACILITY:  [facility]  PHYSICIAN:  Samson Frederic, MD     DATE OF BIRTH:  Jul 03, 1945  DATE OF PROCEDURE:  02/27/2015 DATE OF DISCHARGE:                              OPERATIVE REPORT   SURGEON:  Samson Frederic, M.D.  ASSISTANT:  Staff.  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  POSTOPERATIVE DIAGNOSIS:  Infected left ankle wound with retained hardware.  PROCEDURE PERFORMED: 1. Debridement of left ankle wound. 2. Removal of deep implant, left ankle.  ANESTHESIA:  General.  EBL:  50 mL.  SPECIMENS:  Left ankle wound culture.  EXPLANT:  Synthes locking third tubular plate with six screws.  ANTIBIOTICS:  1 g of vancomycin.  COMPLICATIONS:  None.  DISPOSITION:  Stable to PACU.  TUBES AND DRAINS:  Incisional wound VAC at 75 mmHg.  INDICATIONS:  The patient is a 70 year old female, who underwent open reduction and internal fixation of left trimalleolar ankle fracture in October 2016, by a Careers adviser in [place].  The patient came to the emergency department today with unknown duration of visible hardware to the lateral aspect of the left ankle with surrounding erythema, drainage and pain.  Risks, benefits and alternatives to debridement of her wound with removal of hardware were explained and she elected to proceed.  We specifically discussed that this may inadequately control her infection and she may need future surgeries including possibly amputation.  DESCRIPTION OF PROCEDURE IN DETAIL:  I identified her in the holding area using two identifiers.  A surgical site was marked by myself.  She was taken to the operating room.  General anesthesia was induced and then she was transferred onto the operating room table.  A bump was placed under the left hip.  No tourniquet was used.  Left lower extremity, prepped  and draped in normal sterile surgical fashion.  Time- out was called verifying site and site of surgery.  I began by examining her left lower extremity.  She had healed medial and anterior wounds on the lateral side of the ankle, she had breakdown over two proximal screws.  She also had breakdown over a distal screw.  Three screw heads were visible.  I began by using a 15-blade to sharply incise the skin of her previous incision.  Blunt dissection was performed down to the plate.  I used a 15-blade to methodically debride nonviable skin and subcutaneous tissue.  I used screwdriver to remove all six screws in the fibula.  The plate was then removed.  I then used a curette to excisionally debride her bony screw tracts.  All nonviable fatty tissue and fascia were sharply debrided with a rongeur.  I then copiously irrigated the wound with 6 liters of saline using low-pressure Cysto tubing.  I then closed the wound with 2-0 interrupted nylon sutures using a [place] style suture.  Incisional wound VAC was placed and obtained a good seal. I applied an Ace wrap.  She was then extubated and taken to the PACU in stable condition.  Sponge, needle,  and instrument counts were correct at the end of the case x2.  There were no known complications.  Of note, wound cultures were taken upon making the incision.  I discussed the operative events and findings with the patient's family. She will be admitted to the hospitalist.  She receive IV antibiotics. We will follow her cultures and tailored the antibiotics appropriately. She will need an Infectious Disease consult.  We will obtain a CAM walker boot and she may weightbear as tolerated in the boot.  We will maintain the wound VAC for now.  All questions solicited and answered.    ______________________________ Samson Frederic, MD   ______________________________ Samson Frederic, MD    BS/MEDQ  D:  02/27/2015  T:  02/28/2015  Job:  [id number]

## 2015-02-28 NOTE — Progress Notes (Signed)
[facility] PROGRESS NOTE  NARYIAH SCHLEY NLG:[id number] DOB: April 07, 1945 DOA: 02/27/2015 PCP: [facility]  Length of stay: 1   Assessment/Plan: Active Problems:   Wound dehiscence   Tachycardia   Deep incisional surgical site infection   Wound cellulitis   Brief summary 70 year old female who had a fall with left ankle fracture in October 2016 which was treated with ORIF of the left ankle in [place]. 6 weeks later, she had a second fall which resulted in a left hip fracture which was treated in [place]  IM nail L IT fx by Dr. Doran Durand 12/25/2014 with chronic trochanteric bursitis She reported she been wearing an ankle brace and was continuously over the last few months and a family member noticed today when she was taking a shower that she had exposed hardware with erythema and periodic discharge from the left ankle. She presented to the emergency department where she had obvious exposed hardware. She was afebrile without leukocytosis however her CRP and ESR very elevated. On exam, she was in no acute distress, heart rate tachycardic but regular,  Her left lower extremity had trace pitting edema she had to exposed screws along the left lateral aspect of her left ankle both of which had a small amount of purulence and boggy mildly erythematous tissue surrounding.CT of the ankle without contrast showed soft tissue edema, exposed second and third screws in the distal tibia.  Patient was taken to the OR on 1/27 by Rod Can, M.D for irrigation and debridement of the left ankle and hardware removal. Currently has a wound VAC in place    Assessment and plan Infected left ankle wound with retained hardware, now with bacteremia Blood culture positive for gram-positive cocci in pairs Currently on vancomycin and Zosyn day 2 Will consult infectious disease for further recommendations- Dr Linus Salmons notified   Left hip pain Given bacteremia will order MRI of  the left hip to r/o septic arthritis, may need aspiration Could also be trochanteric bursitis as she has a hx of it   Hypokalemia Replete potassium  Sinus tachycardia. Evaluated by Cardiology in November 11/15. Likely in the setting of low-grade fever, sepsis  Continue metoprolol at the current dose, in the setting of possible sepsis, patient was supposed to be taking 25 mg BID  at home  Started on IV hydration , in the setting of fever   Elevated lactic acid. Suspect from dehydration. Low suspicion for sepsis.  Anemia of chronic disease-baseline around 8.5-9, likely dehydrated yesterday with a hemoglobin of 9.8, continue to monitor Will order anemia panel, TSH, Will transfuse for hemoglobin less than 7.5 or active bleeding  Depression. -continue home Zoloft  History of grade I diastolic dysfunction on echo July 2016. No evidence for acute decompensation at present.   Elevated alk phos. Normal in November when here for fracture. Etiology not clear. No osteomyelitis on CTscan. She has a mildly elevated AST as well. Remainder of LFTs normal.  -am LFTs   DVT prophylaxsis Lovenox  Code Status:      Code Status Orders        Start     Ordered   02/27/15 2134  Full code   Continuous     02/27/15 2133     Family Communication: Discussed in detail with the patient, all imaging results, lab results explained to the patient   Disposition Plan:  As above      Consultants:  Orthopedics  Procedures: Infected  left ankle wound with retained hardware.  PROCEDURE PERFORMED: 1. Debridement of left ankle wound. 2. Removal of deep implant, left ankle.  Antibiotics: Anti-infectives    Start     Dose/Rate Route Frequency Ordered Stop   02/28/15 0600  vancomycin (VANCOCIN) 500 mg in sodium chloride 0.9 % 100 mL IVPB     500 mg 100 mL/hr over 60 Minutes Intravenous Every 12 hours 02/27/15 2115     02/27/15 2200  piperacillin-tazobactam (ZOSYN) IVPB 3.375 g     3.375  g 12.5 mL/hr over 240 Minutes Intravenous Every 8 hours 02/27/15 2114     02/27/15 1830  vancomycin (VANCOCIN) IVPB 1000 mg/200 mL premix  Status:  Discontinued     1,000 mg 200 mL/hr over 60 Minutes Intravenous  Once 02/27/15 1824 02/27/15 1839   02/27/15 1700  vancomycin (VANCOCIN) IVPB 1000 mg/200 mL premix     1,000 mg 200 mL/hr over 60 Minutes Intravenous To Surgery 02/27/15 1650 02/27/15 1915         HPI/Subjective: Low-grade fever overnight,   sinus tachycardia on telemetry  Objective: Filed Vitals:   02/27/15 2100 02/27/15 2128 02/28/15 0030 02/28/15 0511  BP:  164/64 145/57 178/64  Pulse: 135 137 139 119  Temp: 99.3 F (37.4 C) 99.3 F (37.4 C) 99.6 F (37.6 C) 99.8 F (37.7 C)  TempSrc:   Axillary Oral  Resp: '14  18 18  '$ Height:      Weight:      SpO2: 97% 100% 100% 100%    Intake/Output Summary (Last 24 hours) at 02/28/15 1034 Last data filed at 02/28/15 0600  Gross per 24 hour  Intake   1450 ml  Output    210 ml  Net   1240 ml    Exam:  General: No acute respiratory distress Lungs: Clear to auscultation bilaterally without wheezes or crackles Cardiovascular: Regular rate and rhythm without murmur gallop or rub normal S1 and S2 Abdomen: Nontender, nondistended, soft, bowel sounds positive, no rebound, no ascites, no appreciable mass MUSCULOSKELETAL: L ankle with 3 exposed screw heads over lateral incision; (+) periwound erythema and seropurulent drainage. Medial and anterior incisions healed without evidence of infection. Ankle very stiff. 2+ DP pulse    Data Review   Micro Results Recent Results (from the past 240 hour(s))  Culture, blood (Routine X 2) w Reflex to ID Panel     Status: None (Preliminary result)   Collection Time: 02/27/15 10:45 AM  Result Value Ref Range Status   Specimen Description BLOOD RIGHT FOREARM  Final   Special Requests BOTTLES DRAWN AEROBIC AND ANAEROBIC 5CCS  Final   Culture  Setup Time   Final    GRAM POSITIVE COCCI  IN CLUSTERS IN BOTH AEROBIC AND ANAEROBIC BOTTLES CRITICAL RESULT CALLED TO, READ BACK BY AND VERIFIED WITH: S WHITEHORN,RN'@0330'$  02/28/15 MKELLY    Culture PENDING  Incomplete   Report Status PENDING  Incomplete  Culture, blood (Routine X 2) w Reflex to ID Panel     Status: None (Preliminary result)   Collection Time: 02/27/15 10:50 AM  Result Value Ref Range Status   Specimen Description BLOOD RIGHT HAND  Final   Special Requests BOTTLES DRAWN AEROBIC AND ANAEROBIC 5CCS  Final   Culture  Setup Time   Final    GRAM POSITIVE COCCI IN CLUSTERS IN PAIRS IN BOTH AEROBIC AND ANAEROBIC BOTTLES CRITICAL RESULT CALLED TO, READ BACK BY AND VERIFIED WITH: S WHITEHORN'@0156'$  02/28/15 MKELLY    Culture PENDING  Incomplete  Report Status PENDING  Incomplete    Radiology Reports Dg Chest 1 View  02/27/2015  CLINICAL DATA:  Shortness of breath with exertion. EXAM: CHEST 1 VIEW COMPARISON:  February 11, 2015 chest radiograph; chest CT December 25, 2014 FINDINGS: There is scarring in the right upper lobe and right base region. There is also scarring in the left base region. There is an ill-defined nodular opacity in the left upper lobe measuring 1.6 x 1.6 cm. No edema or consolidation. Heart size and pulmonary vascularity are normal. No adenopathy. No bone lesions. IMPRESSION: Areas of scarring, stable. Ill-defined nodular opacity left upper lobe measuring 1.6 x 1.6 cm. On prior CT, there was consolidation in this area. It is possible that this current nodular opacity in the left upper lobe represents residua from that infiltrate. An underlying mass which remains is a differential consideration, however, and given this possibility, noncontrast enhanced chest CT at this time is warranted. Lungs elsewhere clear.  No change in cardiac silhouette. Electronically Signed   By: Lowella Grip III M.D.   On: 02/27/2015 10:52   Dg Ankle Complete Left  02/27/2015  CLINICAL DATA:  Redness swelling and pain about the  left ankle, status post ORIF in October 2016. EXAM: LEFT ANKLE COMPLETE - 3+ VIEW COMPARISON:  None. FINDINGS: There has been a prior tri malleolar fracture fixation. There is diffuse osteopenia. The distal tibial screws are in good position. The lateral plate and screw fixation of the distal fibula is also with satisfactory radiologic positioning. There is no evidence of acute fracture. There is no evidence of radiographically apparent loosening. There is mild callus formation about the fracture lines. There is bilateral ankle soft tissue swelling. No soft tissue emphysema. IMPRESSION: Status post tri malleolar fracture fixation with normal appearance of the orthopedic hardware. Osteopenia. Mild callus formation at the fracture lines. No evidence of acute fracture. Bimalleolar soft tissue swelling without evidence of soft tissue emphysema. Electronically Signed   By: Fidela Salisbury M.D.   On: 02/27/2015 10:49   Ct Ankle Left Wo Contrast  02/27/2015  CLINICAL DATA:  Left foot pain with redness and exudate. Protruding fixation screws. Prior open reduction and internal fixation of fractures of the distal tibia and fibula. EXAM: CT OF THE LEFT ANKLE WITHOUT CONTRAST TECHNIQUE: Multidetector CT imaging of the left ankle was performed according to the standard protocol. Multiplanar CT image reconstructions were also generated. COMPARISON:  Radiographs dated 02/27/2015 FINDINGS: There are old healed fractures of distal tibia and fibula. 3 screws in the distal tibia are in good position. Side plate and multiple screws appear in good position in the distal tibia. No evidence of loosening of the screws. There is soft tissue edema anterior to the distal tibia and anterior lateral aspect of the proximal talus. No bone destruction to suggest osteomyelitis. No acute fractures. The tendons around the ankle appear normal. There is no ankle or subtalar joint effusion. The skin is quite thin over the second and third screws  in the distal fibula counting from proximal to distal. These may be the exposed screw heads. IMPRESSION: No acute osseous abnormality. Slight soft tissue edema adjacent to the distal fibula and at the anterior lateral aspect the proximal talus just anterior to the fibula. No loosening of the hardware. The heads of the second and third screws in the distal tibia may be exposed due to break down of overlying skin. Electronically Signed   By: Lorriane Shire M.D.   On: 02/27/2015 15:34  Dg Ankle Left Port  02/27/2015  CLINICAL DATA:  Status post hardware removal with incision and drainage of left ankle. EXAM: PORTABLE LEFT ANKLE - 2 VIEW COMPARISON:  02/27/2015, earlier same day. FINDINGS: Interval retrieval of lateral fibular plate evident. Cannulated compression screws remain in the distal tibia, as before. Lateral soft tissue swelling is evident. Bones are diffusely demineralized. Vacuum device overlies the lateral soft tissues. IMPRESSION: Status post retrieval of tibial plate Electronically Signed   By: Misty Stanley M.D.   On: 02/27/2015 20:33   Dg Hip Unilat With Pelvis 2-3 Views Left  02/27/2015  CLINICAL DATA:  Left hip surgery in November. Throbbing left hip pain medially. EXAM: DG HIP (WITH OR WITHOUT PELVIS) 2-3V LEFT COMPARISON:  12/25/2014 FINDINGS: Remote left intertrochanteric hip fracture transfixed with a intra medullary nail and interlocking cannulated femoral neck screw. Ununited displaced lesser trochanteric fracture fragment. Subtle lucency in the proximal left femur in the region of the lesser trochanter fracture which may be artifactual secondary to overlapping lesser trochanteric fracture fragment versus incomplete healing. No other fracture or dislocation. No hardware failure or complication. Mild osteoarthritis of the left hip. IMPRESSION: 1. Remote left intertrochanteric hip fracture transfixed with a intra medullary nail and interlocking cannulated femoral neck screw. Ununited  displaced lesser trochanteric fracture fragment. Subtle lucency in the proximal left femur in the region of the lesser trochanter fracture which may be artifactual secondary to overlapping lesser trochanteric fracture fragment versus incomplete healing. Electronically Signed   By: Kathreen Devoid   On: 02/27/2015 10:51     CBC  Recent Labs Lab 02/27/15 0939 02/28/15 0027  WBC 9.8 9.5  HGB 9.8* 8.2*  HCT 29.8* 25.8*  PLT 534* 439*  MCV 87.1 87.8  MCH 28.7 27.9  MCHC 32.9 31.8  RDW 17.6* 17.2*  LYMPHSABS 0.7  --   MONOABS 0.6  --   EOSABS 0.0  --   BASOSABS 0.0  --     Chemistries   Recent Labs Lab 02/27/15 1038 02/28/15 0027  NA 141 140  K 3.5 3.3*  CL 105 104  CO2 22 26  GLUCOSE 124* 161*  BUN 14 11  CREATININE 0.88 0.77  CALCIUM 8.7* 8.6*  AST 85*  --   ALT 37  --   ALKPHOS 497*  --   BILITOT 0.6  --    ------------------------------------------------------------------------------------------------------------------ estimated creatinine clearance is 51.8 mL/min (by C-G formula based on Cr of 0.77). ------------------------------------------------------------------------------------------------------------------ No results for input(s): HGBA1C in the last 72 hours. ------------------------------------------------------------------------------------------------------------------ No results for input(s): CHOL, HDL, LDLCALC, TRIG, CHOLHDL, LDLDIRECT in the last 72 hours. ------------------------------------------------------------------------------------------------------------------ No results for input(s): TSH, T4TOTAL, T3FREE, THYROIDAB in the last 72 hours.  Invalid input(s): FREET3 ------------------------------------------------------------------------------------------------------------------ No results for input(s): VITAMINB12, FOLATE, FERRITIN, TIBC, IRON, RETICCTPCT in the last 72 hours.  Coagulation profile No results for input(s): INR, PROTIME in the last  168 hours.  No results for input(s): DDIMER in the last 72 hours.  Cardiac Enzymes No results for input(s): CKMB, TROPONINI, MYOGLOBIN in the last 168 hours.  Invalid input(s): CK ------------------------------------------------------------------------------------------------------------------ Invalid input(s): POCBNP   CBG: No results for input(s): GLUCAP in the last 168 hours.     Studies: Dg Chest 1 View  02/27/2015  CLINICAL DATA:  Shortness of breath with exertion. EXAM: CHEST 1 VIEW COMPARISON:  February 11, 2015 chest radiograph; chest CT December 25, 2014 FINDINGS: There is scarring in the right upper lobe and right base region. There is also scarring in the left base  region. There is an ill-defined nodular opacity in the left upper lobe measuring 1.6 x 1.6 cm. No edema or consolidation. Heart size and pulmonary vascularity are normal. No adenopathy. No bone lesions. IMPRESSION: Areas of scarring, stable. Ill-defined nodular opacity left upper lobe measuring 1.6 x 1.6 cm. On prior CT, there was consolidation in this area. It is possible that this current nodular opacity in the left upper lobe represents residua from that infiltrate. An underlying mass which remains is a differential consideration, however, and given this possibility, noncontrast enhanced chest CT at this time is warranted. Lungs elsewhere clear.  No change in cardiac silhouette. Electronically Signed   By: Lowella Grip III M.D.   On: 02/27/2015 10:52   Dg Ankle Complete Left  02/27/2015  CLINICAL DATA:  Redness swelling and pain about the left ankle, status post ORIF in October 2016. EXAM: LEFT ANKLE COMPLETE - 3+ VIEW COMPARISON:  None. FINDINGS: There has been a prior tri malleolar fracture fixation. There is diffuse osteopenia. The distal tibial screws are in good position. The lateral plate and screw fixation of the distal fibula is also with satisfactory radiologic positioning. There is no evidence of acute  fracture. There is no evidence of radiographically apparent loosening. There is mild callus formation about the fracture lines. There is bilateral ankle soft tissue swelling. No soft tissue emphysema. IMPRESSION: Status post tri malleolar fracture fixation with normal appearance of the orthopedic hardware. Osteopenia. Mild callus formation at the fracture lines. No evidence of acute fracture. Bimalleolar soft tissue swelling without evidence of soft tissue emphysema. Electronically Signed   By: Fidela Salisbury M.D.   On: 02/27/2015 10:49   Ct Ankle Left Wo Contrast  02/27/2015  CLINICAL DATA:  Left foot pain with redness and exudate. Protruding fixation screws. Prior open reduction and internal fixation of fractures of the distal tibia and fibula. EXAM: CT OF THE LEFT ANKLE WITHOUT CONTRAST TECHNIQUE: Multidetector CT imaging of the left ankle was performed according to the standard protocol. Multiplanar CT image reconstructions were also generated. COMPARISON:  Radiographs dated 02/27/2015 FINDINGS: There are old healed fractures of distal tibia and fibula. 3 screws in the distal tibia are in good position. Side plate and multiple screws appear in good position in the distal tibia. No evidence of loosening of the screws. There is soft tissue edema anterior to the distal tibia and anterior lateral aspect of the proximal talus. No bone destruction to suggest osteomyelitis. No acute fractures. The tendons around the ankle appear normal. There is no ankle or subtalar joint effusion. The skin is quite thin over the second and third screws in the distal fibula counting from proximal to distal. These may be the exposed screw heads. IMPRESSION: No acute osseous abnormality. Slight soft tissue edema adjacent to the distal fibula and at the anterior lateral aspect the proximal talus just anterior to the fibula. No loosening of the hardware. The heads of the second and third screws in the distal tibia may be exposed  due to break down of overlying skin. Electronically Signed   By: Lorriane Shire M.D.   On: 02/27/2015 15:34   Dg Ankle Left Port  02/27/2015  CLINICAL DATA:  Status post hardware removal with incision and drainage of left ankle. EXAM: PORTABLE LEFT ANKLE - 2 VIEW COMPARISON:  02/27/2015, earlier same day. FINDINGS: Interval retrieval of lateral fibular plate evident. Cannulated compression screws remain in the distal tibia, as before. Lateral soft tissue swelling is evident. Bones are diffusely demineralized.  Vacuum device overlies the lateral soft tissues. IMPRESSION: Status post retrieval of tibial plate Electronically Signed   By: Misty Stanley M.D.   On: 02/27/2015 20:33   Dg Hip Unilat With Pelvis 2-3 Views Left  02/27/2015  CLINICAL DATA:  Left hip surgery in November. Throbbing left hip pain medially. EXAM: DG HIP (WITH OR WITHOUT PELVIS) 2-3V LEFT COMPARISON:  12/25/2014 FINDINGS: Remote left intertrochanteric hip fracture transfixed with a intra medullary nail and interlocking cannulated femoral neck screw. Ununited displaced lesser trochanteric fracture fragment. Subtle lucency in the proximal left femur in the region of the lesser trochanter fracture which may be artifactual secondary to overlapping lesser trochanteric fracture fragment versus incomplete healing. No other fracture or dislocation. No hardware failure or complication. Mild osteoarthritis of the left hip. IMPRESSION: 1. Remote left intertrochanteric hip fracture transfixed with a intra medullary nail and interlocking cannulated femoral neck screw. Ununited displaced lesser trochanteric fracture fragment. Subtle lucency in the proximal left femur in the region of the lesser trochanter fracture which may be artifactual secondary to overlapping lesser trochanteric fracture fragment versus incomplete healing. Electronically Signed   By: Kathreen Devoid   On: 02/27/2015 10:51      Lab Results  Component Value Date   HGBA1C 5.3 08/07/2014    Lab Results  Component Value Date   LDLCALC 69 08/07/2014   CREATININE 0.77 02/28/2015       Scheduled Meds: . aspirin EC  325 mg Oral Daily  . calcium carbonate  1,250 mg Oral Q breakfast  . enoxaparin (LOVENOX) injection  40 mg Subcutaneous Q24H  . metoprolol tartrate  12.5 mg Oral BID  . pantoprazole  40 mg Oral QAC breakfast  . piperacillin-tazobactam (ZOSYN)  IV  3.375 g Intravenous Q8H  . sertraline  100 mg Oral Daily  . sodium chloride flush  3 mL Intravenous Q12H  . vancomycin  500 mg Intravenous Q12H   Continuous Infusions: . sodium chloride    . sodium chloride    . sodium chloride      Active Problems:   Wound dehiscence   Tachycardia   Deep incisional surgical site infection   Wound cellulitis    Time spent: 45 minutes   [facility] Pager [phone]. If 7PM-7AM, please contact night-coverage at [email or web address], password [facility] 02/28/2015, 10:34 AM  LOS: 1 day

## 2015-02-28 NOTE — Evaluation (Signed)
Occupational Therapy Evaluation Patient Details Name: [name] MRN: [id number] DOB: [birth year] Today's Date: 02/28/2015    History of Present Illness  This 70 y.o. Female admitted with increased pain Lt hip and ankle as well as exposed hardware Lt ankle.   She underwent removal of ankle hardware and application of wound VAC lt ankle 02/27/15.   PMH includes Lt ankle fx 10/16 due to fall.  She went to SNF at [facility] afterwards.  She went to daughter's for Thanksgiving and sustained fall while there resulting in Lt hip fracture, and underwent ORIF.  Other PMH includes:  Opoid abuse, OCD, sepsis, anxiety, dizziness, depressive disorder, H/O MRSA    Clinical Impression   Pt admitted with above. She demonstrates the below listed deficits and will benefit from continued OT to maximize safety and independence with BADLs.  Despite max attempts, pt unable to move to EOB this morning due to Lt hip and ankle pain (pt premedicated).  Currently, she requires max - total A for ADLs.  Anticipate slow progress and will likely need SNF level rehab at discharge.  Will follow acutely.       Follow Up Recommendations  SNF;Supervision/Assistance - 24 hour    Equipment Recommendations  3 in 1 bedside comode    Recommendations for Other Services       Precautions / Restrictions Precautions Precautions: Fall Precaution Comments: two falls at home 10/16 (ankle fracture - fell in yard), 11/16 fell at daughter's (hip fracture).  Denies other falls  Required Braces or Orthoses: Other Brace/Splint Other Brace/Splint: CAM walker  Restrictions Weight Bearing Restrictions: Yes LLE Weight Bearing: Weight bearing as tolerated      Mobility Bed Mobility Overal bed mobility: Needs Assistance Bed Mobility: Rolling Rolling: Max assist         General bed mobility comments: Pt unable to move to EOB due to increased pain   Transfers                 General transfer comment: unable due to  pain     Balance                                            ADL Overall ADL's : Needs assistance/impaired Eating/Feeding: Set up;Bed level   Grooming: Wash/dry hands;Wash/dry face;Oral care;Set up;Bed level   Upper Body Bathing: Minimal assitance;Bed level   Lower Body Bathing: Maximal assistance;Bed level   Upper Body Dressing : Moderate assistance;Bed level   Lower Body Dressing: Total assistance;Bed level   Toilet Transfer: Total assistance   Toileting- Clothing Manipulation and Hygiene: Total assistance;Bed level         General ADL Comments: Pt unable to move to EOB or OOB this date due to increased pain      Vision     Perception     Praxis      Pertinent Vitals/Pain Pain Assessment: 0-10 Pain Score: 8  Pain Location: Lt hip and ankle  Pain Descriptors / Indicators: Throbbing Pain Intervention(s): Monitored during session;Repositioned;Patient requesting pain meds-RN notified;RN gave pain meds during session     Hand Dominance Right   Extremity/Trunk Assessment Upper Extremity Assessment Upper Extremity Assessment: Overall WFL for tasks assessed   Lower Extremity Assessment Lower Extremity Assessment: Defer to PT evaluation       Communication     Cognition Arousal/Alertness: Awake/alert Behavior During Therapy: [facility]  for tasks assessed/performed Overall Cognitive Status: Within Functional Limits for tasks assessed                     General Comments       Exercises       Shoulder Instructions      Home Living Family/patient expects to be discharged to:: Private residence   Available Help at Discharge: Family;Available 24 hours/day Type of Home: House Home Access: Stairs to enter [employer] of Steps: 4 Entrance Stairs-Rails: Right;Left;Can reach both Home Layout: One level     Bathroom Shower/Tub: Tub/shower unit Shower/tub characteristics: Engineer, building services: Standard Bathroom  Accessibility: Yes How Accessible: Accessible via walker Home Equipment: Walker - 2 wheels;Tub bench   Additional Comments: lives with son who can provide 24 hour assist       Prior Functioning/Environment Level of Independence: Needs assistance  Gait / Transfers Assistance Needed: Pt was ambulating with RW with supervision to mod I until ~2-3 days PTA  ADL's / Homemaking Assistance Needed: Supervision for ADLs until 2-3 days PTA         OT Diagnosis: Generalized weakness;Acute pain   OT Problem List: Decreased strength;Decreased activity tolerance;Impaired balance (sitting and/or standing);Decreased knowledge of use of DME or AE;Cardiopulmonary status limiting activity;Pain   OT Treatment/Interventions: Self-care/ADL training;DME and/or AE instruction;Therapeutic activities;Balance training;Patient/family education    OT Goals(Current goals can be found in the care plan section) Acute Rehab OT Goals Patient Stated Goal: to reduce pain  OT Goal Formulation: With patient Time For Goal Achievement: 03/14/15 Potential to Achieve Goals: Good ADL Goals Pt Will Perform Grooming: with min assist;standing Pt Will Perform Lower Body Bathing: with min assist;with adaptive equipment;sit to/from stand Pt Will Perform Upper Body Dressing: with set-up;sitting Pt Will Perform Lower Body Dressing: with min assist;sit to/from stand;with adaptive equipment Pt Will Transfer to Toilet: with min assist;ambulating;regular height toilet;bedside commode;grab bars Pt Will Perform Toileting - Clothing Manipulation and hygiene: with min assist;sit to/from stand  OT Frequency: Min 2X/week   Barriers to D/C: Decreased caregiver support  unsure family can provide level of care pt will require at discharge        Co-evaluation              End of Session Nurse Communication: Patient requests pain meds;Other (comment) (? IV infiltrated )  Activity Tolerance: Patient limited by pain Patient left:  in bed;with call bell/phone within reach;with bed alarm set;with family/visitor present   Time: 1112-1140 OT Time Calculation (min): 28 min Charges:  OT General Charges $OT Visit: 1 Procedure OT Evaluation $OT Eval Moderate Complexity: 1 Procedure OT Treatments $Therapeutic Activity: 8-22 mins G-Codes:    Lafayette Dunlevy M 03-26-2015, 11:53 AM

## 2015-02-28 NOTE — Progress Notes (Signed)
Orthopedics Progress Note  Subjective: Patient reporting left ankle pain with movement of the leg or foot  Objective:  Filed Vitals:   02/28/15 0030 02/28/15 0511  BP: 145/57 178/64  Pulse: 139 119  Temp: 99.6 F (37.6 C) 99.8 F (37.7 C)  Resp: 18 18    General: Awake and alert  Musculoskeletal: wound vac in place and functioning appropriately Neurovascularly intact  Lab Results  Component Value Date   WBC 9.5 02/28/2015   HGB 8.2* 02/28/2015   HCT 25.8* 02/28/2015   MCV 87.8 02/28/2015   PLT 439* 02/28/2015       Component Value Date/Time   NA 140 02/28/2015 0027   NA 137 12/12/2014   K 3.3* 02/28/2015 0027   CL 104 02/28/2015 0027   CO2 26 02/28/2015 0027   GLUCOSE 161* 02/28/2015 0027   BUN 11 02/28/2015 0027   BUN 9 12/12/2014   CREATININE 0.77 02/28/2015 0027   CREATININE 1.0 12/12/2014   CALCIUM 8.6* 02/28/2015 0027   GFRNONAA >60 02/28/2015 0027   GFRAA >60 02/28/2015 0027    Lab Results  Component Value Date   INR 1.10 12/25/2014    Assessment/Plan: POD #1 s/p Procedure(s): HARDWARE REMOVAL OF ANKLE AND I AND D APPLICATION OF WOUND VAC Acute blookd loss anemia - consider transfusion (defer to internal medicine team) Mobilization as tolerated IV antibiotics - Gram Positive cocci in clusters and pairs (staph and strep)  Almedia Balls. Ranell Patrick, MD 02/28/2015 9:05 AM

## 2015-02-28 NOTE — Consult Note (Signed)
[facility]       Reason for Consult: wound infection    Referring Physician: Dr Susie Cassette  Active Problems:   Wound dehiscence   Tachycardia   Deep incisional surgical site infection   Wound cellulitis   . aspirin EC  325 mg Oral Daily  . calcium carbonate  1,250 mg Oral Q breakfast  . enoxaparin (LOVENOX) injection  40 mg Subcutaneous Q24H  . metoprolol tartrate  12.5 mg Oral BID  . pantoprazole  40 mg Oral QAC breakfast  . piperacillin-tazobactam (ZOSYN)  IV  3.375 g Intravenous Q8H  . sertraline  100 mg Oral Daily  . sodium chloride  250 mL Intravenous Once  . sodium chloride flush  3 mL Intravenous Q12H  . vancomycin  500 mg Intravenous Q12H    Recommendations: Continue with vancomcyin and cefazolin pending ID Will need 6 weeks IV antibiotics picc when blood cultures clear 48-72 hours   Assessment: She has post op infection with removal of hardware in bone with non viable tissue c/w osteomyelitis. Growing GPC  Antibiotics: Vancomycin and zosyn  HPI: [name] is a 70 y.o. female with a fall in October 2016 with left ankle fx and ORIF in [place] then another fall 6 weeks later with left hip fracture treated here by Dr. Victorino Dike with IM nail who came in with left ankle exposed hardware, and drainage.  No fever, no chills.  + pain.  Cultures done prior to antibiotics and growing GPC in clusters.  Does not known how long it was exposed and only noted by her son.   Xray ankle independently reviewed, plate removed  Review of Systems:  Constitutional: negative for fevers and chills Cardiovascular: negative for chest pain Gastrointestinal: negative for nausea and diarrhea All other systems reviewed and are negative   Past Medical History  Diagnosis Date  . Chicken pox   . Chest pain   . SOB (shortness of breath) on exertion   . Fall 06/15/2011    Caused discomfort in lower left rib and left lateral hip area  . H/O: hysterectomy 1985    . Depressive disorder   . Pre-syncope   . Dizziness   . OCD (obsessive compulsive disorder)   . Anxiety   . Opioid abuse   . Hypokalemia   . Severe sepsis (HCC)   . Anemia   . Azotemia   . Left trimalleolar fracture   . Respiratory failure (HCC)   . History of MRSA infection   . Leukocytosis     Social History  Substance Use Topics  . Smoking status: Never Smoker   . Smokeless tobacco: None  . Alcohol Use: No    Family History  Problem Relation Age of Onset  . Heart attack Mother   . Colon cancer Father   . Diabetes Brother   . Heart attack Brother 25    MI    Allergies  Allergen Reactions  . Sulfa Antibiotics     Hives     Physical Exam: Constitutional: in no apparent distress and alert  Filed Vitals:   02/28/15 0030 02/28/15 0511  BP: 145/57 178/64  Pulse: 139 119  Temp: 99.6 F (37.6 C) 99.8 F (37.7 C)  Resp: 18 18   EYES: anicteric ENMT: Cardiovascular: Cor RRR and No murmurs Respiratory: CTA B; normal respiratory effort GI: Bowel sounds are normal, Bowel sounds active, liver is not enlarged, spleen is not enlarged Musculoskeletal: no pedal edema noted; left leg  wrapped Skin: negatives: no rash Hematologic: no cervical lad  Lab Results  Component Value Date   WBC 9.5 02/28/2015   HGB 8.2* 02/28/2015   HCT 25.8* 02/28/2015   MCV 87.8 02/28/2015   PLT 439* 02/28/2015    Lab Results  Component Value Date   CREATININE 0.77 02/28/2015   BUN 11 02/28/2015   NA 140 02/28/2015   K 3.3* 02/28/2015   CL 104 02/28/2015   CO2 26 02/28/2015    Lab Results  Component Value Date   ALT 37 02/27/2015   AST 85* 02/27/2015   ALKPHOS 497* 02/27/2015     Microbiology: Recent Results (from the past 240 hour(s))  Culture, blood (Routine X 2) w Reflex to ID Panel     Status: None (Preliminary result)   Collection Time: 02/27/15 10:45 AM  Result Value Ref Range Status   Specimen Description BLOOD RIGHT FOREARM  Final   Special Requests BOTTLES  DRAWN AEROBIC AND ANAEROBIC 5CCS  Final   Culture  Setup Time   Final    GRAM POSITIVE COCCI IN CLUSTERS IN BOTH AEROBIC AND ANAEROBIC BOTTLES CRITICAL RESULT CALLED TO, READ BACK BY AND VERIFIED WITH: S WHITEHORN,RN@0330  02/28/15 MKELLY    Culture NO GROWTH < 24 HOURS  Final   Report Status PENDING  Incomplete  Culture, blood (Routine X 2) w Reflex to ID Panel     Status: None (Preliminary result)   Collection Time: 02/27/15 10:50 AM  Result Value Ref Range Status   Specimen Description BLOOD RIGHT HAND  Final   Special Requests BOTTLES DRAWN AEROBIC AND ANAEROBIC 5CCS  Final   Culture  Setup Time   Final    GRAM POSITIVE COCCI IN CLUSTERS IN PAIRS IN BOTH AEROBIC AND ANAEROBIC BOTTLES CRITICAL RESULT CALLED TO, READ BACK BY AND VERIFIED WITH: S WHITEHORN@0156  02/28/15 MKELLY    Culture NO GROWTH < 24 HOURS  Final   Report Status PENDING  Incomplete    Staci Righter, MD [facility] [email or web address] [phone] pager  [phone] cell 02/28/2015, 12:34 PM

## 2015-02-28 NOTE — Op Note (Deleted)
NAME:  [name], [name]                ACCOUNT NO.:  [id number]  MEDICAL RECORD NO.:  [id number]  LOCATION:                               FACILITY:  [facility]  PHYSICIAN:  Paiden Cavell, MD     DATE OF BIRTH:  12/24/1945  DATE OF PROCEDURE:  02/27/2015 DATE OF DISCHARGE:                              OPERATIVE REPORT   SURGEON:  Ka Bench, M.D.  ASSISTANT:  Staff.  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  POSTOPERATIVE DIAGNOSIS:  Infected left ankle wound with retained hardware.  PROCEDURE PERFORMED: 1. Debridement of left ankle wound. 2. Removal of deep implant, left ankle.  ANESTHESIA:  General.  EBL:  50 mL.  SPECIMENS:  Left ankle wound culture.  EXPLANT:  Synthes locking third tubular plate with six screws.  ANTIBIOTICS:  1 g of vancomycin.  COMPLICATIONS:  None.  DISPOSITION:  Stable to PACU.  TUBES AND DRAINS:  Incisional wound VAC at 75 mmHg.  INDICATIONS:  The patient is a 69-year-old female, who underwent open reduction and internal fixation of left trimalleolar ankle fracture in October 2016, by a surgeon in [place].  The patient came to the emergency department today with unknown duration of visible hardware to the lateral aspect of the left ankle with surrounding erythema, drainage and pain.  Risks, benefits and alternatives to debridement of her wound with removal of hardware were explained and she elected to proceed.  We specifically discussed that this may inadequately control her infection and she may need future surgeries including possibly amputation.  DESCRIPTION OF PROCEDURE IN DETAIL:  I identified her in the holding area using two identifiers.  A surgical site was marked by myself.  She was taken to the operating room.  General anesthesia was induced and then she was transferred onto the operating room table.  A bump was placed under the left hip.  No tourniquet was used.  Left lower extremity, prepped  and draped in normal sterile surgical fashion.  Time- out was called verifying site and site of surgery.  I began by examining her left lower extremity.  She had healed medial and anterior wounds on the lateral side of the ankle, she had breakdown over two proximal screws.  She also had breakdown over a distal screw.  Three screw heads were visible.  I began by using a 15-blade to sharply incise the skin of her previous incision.  Blunt dissection was performed down to the plate.  I used a 15-blade to methodically debride nonviable skin and subcutaneous tissue.  I used screwdriver to remove all six screws in the fibula.  The plate was then removed.  I then used a curette to excisionally debride her bony screw tracts.  All nonviable fatty tissue and fascia were sharply debrided with a rongeur.  I then copiously irrigated the wound with 6 liters of saline using low-pressure Cysto tubing.  I then closed the wound with 2-0 interrupted nylon sutures using a [place] style suture.  Incisional wound VAC was placed and obtained a good seal. I applied an Ace wrap.  She was then extubated and taken to the PACU in stable condition.  Sponge, needle,   and instrument counts were correct at the end of the case x2.  There were no known complications.  Of note, wound cultures were taken upon making the incision.  I discussed the operative events and findings with the patient's family. She will be admitted to the hospitalist.  She receive IV antibiotics. We will follow her cultures and tailored the antibiotics appropriately. She will need an Infectious Disease consult.  We will obtain a CAM walker boot and she may weightbear as tolerated in the boot.  We will maintain the wound VAC for now.  All questions solicited and answered.    ______________________________ Shari Natt, MD   ______________________________ Kala Gassmann, MD    BS/MEDQ  D:  02/27/2015  T:  02/28/2015  Job:  [id number] 

## 2015-02-28 NOTE — Evaluation (Signed)
Physical Therapy Evaluation Patient Details Name: [name] MRN: [id number] DOB: [birth year] Today's Date: 02/28/2015   History of Present Illness  This 70 y.o. female admitted 02/27/15 with increased pain Lt hip and ankle as well as exposed hardware Lt ankle. She underwent removal of ankle hardware and application of wound VAC lt ankle 02/27/15. PMH includes Lt ankle fx 10/16 due to fall. She went to SNF at [facility] afterwards. She went to daughter's for Thanksgiving and sustained fall while there resulting in Lt hip fracture, and underwent ORIF. Other PMH includes: Opoid abuse, OCD, sepsis, anxiety, dizziness, depressive disorder, H/O MRSA   Clinical Impression  Patient presents with problems listed below.  Will benefit from acute PT to maximize functional mobility prior to discharge.  Patient requiring +2 assist for mobility.  Recommend ST-SNF for continued therapy at discharge prior to returning home with family.    Follow Up Recommendations SNF;Supervision/Assistance - 24 hour    Equipment Recommendations  Wheelchair (measurements PT);Wheelchair cushion (measurements PT)    Recommendations for Other Services       Precautions / Restrictions Precautions Precautions: Fall Precaution Comments: two falls at home 10/16 (ankle fracture - fell in yard), 11/16 fell at daughter's (hip fracture).  Denies other falls  Required Braces or Orthoses: Other Brace/Splint Other Brace/Splint: CAM walker  Restrictions Weight Bearing Restrictions: Yes LLE Weight Bearing: Weight bearing as tolerated      Mobility  Bed Mobility Overal bed mobility: Needs Assistance Bed Mobility: Supine to Sit     Supine to sit: Mod assist     General bed mobility comments: Verbal cues for technique.  Assist to move LLE off of bed, and to raise trunk to sitting position.    Transfers Overall transfer level: Needs assistance Equipment used: 2 person hand held assist Transfers: Sit to/from  [employer] Sit to Stand: Mod assist;+2 physical assistance Stand pivot transfers: Mod assist;+2 physical assistance       General transfer comment: Verbal cues for technique.  Assist to power up to stance.  Patient able to take several shuffle steps to pivot to chair with +2 mod assist.  Assist to control descent into chair.  Ambulation/Gait             General Gait Details: Unable  Stairs            Wheelchair Mobility    Modified Rankin (Stroke Patients Only)       Balance Overall balance assessment: Needs assistance Sitting-balance support: Single extremity supported;Feet supported Sitting balance-Leahy Scale: Poor Sitting balance - Comments: Required UE support and assist to maintain sitting balance. Postural control: Posterior lean                                   Pertinent Vitals/Pain Pain Assessment: 0-10 Pain Score: 9  Pain Location: Lt hip and ankle Pain Descriptors / Indicators: Throbbing;Sore Pain Intervention(s): Limited activity within patient's tolerance;Monitored during session;Repositioned;Ice applied    Home Living Family/patient expects to be discharged to:: Private residence Living Arrangements: Children (Son) Available Help at Discharge: Family;Available 24 hours/day Type of Home: House Home Access: Stairs to enter Entrance Stairs-Rails: Right;Left;Can reach both Entrance Stairs-Number of Steps: 4 Home Layout: One level Home Equipment: Walker - 2 wheels;Tub bench Additional Comments: lives with son who can provide 24 hour assist     Prior Function Level of Independence: Needs assistance   Gait / Transfers Assistance  Needed: Pt was ambulating with RW with supervision to mod I until ~2-3 days PTA   ADL's / Homemaking Assistance Needed: Supervision for ADLs until 2-3 days PTA         Hand Dominance   Dominant Hand: Right    Extremity/Trunk Assessment   Upper Extremity Assessment: Defer to OT  evaluation           Lower Extremity Assessment: Generalized weakness;LLE deficits/detail   LLE Deficits / Details: Decreased strength and ROM due to pain and surgery.  Patient with VAC and CAM boot Lt foot/ankle.     Communication   Communication: No difficulties  Cognition Arousal/Alertness: Awake/alert Behavior During Therapy: WFL for tasks assessed/performed Overall Cognitive Status: Within Functional Limits for tasks assessed                      General Comments General comments (skin integrity, edema, etc.): VAC on Lt ankle    Exercises        Assessment/Plan    PT Assessment Patient needs continued PT services  PT Diagnosis Difficulty walking;Generalized weakness;Acute pain   PT Problem List Decreased strength;Decreased range of motion;Decreased activity tolerance;Decreased balance;Decreased mobility;Decreased knowledge of use of DME;Decreased skin integrity;Pain  PT Treatment Interventions DME instruction;Gait training;Functional mobility training;Therapeutic activities;Therapeutic exercise;Patient/family education   PT Goals (Current goals can be found in the Care Plan section) Acute Rehab PT Goals Patient Stated Goal: to reduce pain  PT Goal Formulation: With patient Time For Goal Achievement: 03/14/15 Potential to Achieve Goals: Good    Frequency Min 5X/week   Barriers to discharge        Co-evaluation               End of Session Equipment Utilized During Treatment: Gait belt;Oxygen (CAM boot) Activity Tolerance: Patient limited by pain Patient left: in chair;with call bell/phone within reach;with chair alarm set Nurse Communication: Mobility status         Time: 1610-9604 PT Time Calculation (min) (ACUTE ONLY): 13 min   Charges:   PT Evaluation $PT Eval Moderate Complexity: 1 Procedure     PT G CodesVena Austria March 18, 2015, 4:10 PM Durenda Hurt. Renaldo Fiddler, [facility]  Acute Rehab Services Pager [phone]

## 2015-02-28 NOTE — Progress Notes (Signed)
Patient's blood culture and anaerobic culture is gram + cocci in clusters and in pairs.  On call notified.  Patient already receiving Vanc IV.

## 2015-03-01 DIAGNOSIS — R000 Tachycardia, unspecified: Secondary | ICD-10-CM

## 2015-03-01 DIAGNOSIS — D62 Acute posthemorrhagic anemia: Secondary | ICD-10-CM

## 2015-03-01 DIAGNOSIS — Z09 Encounter for follow-up examination after completed treatment for conditions other than malignant neoplasm: Secondary | ICD-10-CM

## 2015-03-01 HISTORY — DX: Acute posthemorrhagic anemia: D62

## 2015-03-01 LAB — COMPREHENSIVE METABOLIC PANEL
ALT: 43 U/L (ref 14–54)
ANION GAP: 9 (ref 5–15)
AST: 57 U/L — ABNORMAL HIGH (ref 15–41)
Albumin: 1.8 g/dL — ABNORMAL LOW (ref 3.5–5.0)
Alkaline Phosphatase: 715 U/L — ABNORMAL HIGH (ref 38–126)
BUN: 8 mg/dL (ref 6–20)
CHLORIDE: 102 mmol/L (ref 101–111)
CO2: 27 mmol/L (ref 22–32)
CREATININE: 0.73 mg/dL (ref 0.44–1.00)
Calcium: 8.2 mg/dL — ABNORMAL LOW (ref 8.9–10.3)
GFR calc non Af Amer: >60 mL/min (ref >60)
Glucose, Bld: 102 mg/dL — ABNORMAL HIGH (ref 65–99)
Potassium: 3.4 mmol/L — ABNORMAL LOW (ref 3.5–5.1)
SODIUM: 138 mmol/L (ref 135–145)
Total Bilirubin: 0.6 mg/dL (ref 0.3–1.2)
Total Protein: 5.3 g/dL — ABNORMAL LOW (ref 6.5–8.1)

## 2015-03-01 LAB — CBC
HCT: 22.4 % — ABNORMAL LOW (ref 36.0–46.0)
Hemoglobin: 7.3 g/dL — ABNORMAL LOW (ref 12.0–15.0)
MCH: 29 pg (ref 26.0–34.0)
MCHC: 32.6 g/dL (ref 30.0–36.0)
MCV: 88.9 fL (ref 78.0–100.0)
PLATELETS: 456 10*3/uL — AB (ref 150–400)
RBC: 2.52 MIL/uL — AB (ref 3.87–5.11)
RDW: 17.3 % — ABNORMAL HIGH (ref 11.5–15.5)
WBC: 8.1 10*3/uL (ref 4.0–10.5)

## 2015-03-01 LAB — PREPARE RBC (CROSSMATCH)

## 2015-03-01 MED ORDER — POTASSIUM CHLORIDE IN NACL 40-0.9 MEQ/L-% IV SOLN
INTRAVENOUS | Status: DC
Start: 2015-03-01 — End: 2015-03-04
  Administered 2015-03-01 – 2015-03-04 (×5): 75 mL/h via INTRAVENOUS
  Filled 2015-03-01 (×7): qty 1000

## 2015-03-01 MED ORDER — METOPROLOL TARTRATE 25 MG PO TABS
25.0000 mg | ORAL_TABLET | Freq: Two times a day (BID) | ORAL | Status: DC
  Administered 2015-03-01 – 2015-03-04 (×6): 25 mg via ORAL
  Filled 2015-03-01 (×6): qty 1

## 2015-03-01 MED ORDER — SODIUM CHLORIDE 0.9 % IV SOLN: Freq: Once | INTRAVENOUS | Status: DC

## 2015-03-01 NOTE — Progress Notes (Signed)
[facility] PROGRESS NOTE  [name] TGY:[id number] DOB: Jun 23, 1945 DOA: 02/27/2015 PCP: [facility]  Length of stay: 2   Assessment/Plan: Active Problems:   Wound dehiscence   Tachycardia   Deep incisional surgical site infection   Wound cellulitis   Brief summary 70 year old female who had a fall with left ankle fracture in October 2016 which was treated with ORIF of the left ankle in [place]. 6 weeks later, she had a second fall which resulted in a left hip fracture which was treated in [place]  IM nail L IT fx by Dr. Doran Durand 12/25/2014 with chronic trochanteric bursitis She reported she been wearing an ankle brace and was continuously over the last few months and a family member noticed today when she was taking a shower that she had exposed hardware with erythema and periodic discharge from the left ankle. She presented to the emergency department where she had obvious exposed hardware. She was afebrile without leukocytosis however her CRP and ESR very elevated. On exam, she was in no acute distress, heart rate tachycardic but regular,  Her left lower extremity had trace pitting edema she had to exposed screws along the left lateral aspect of her left ankle both of which had a small amount of purulence and boggy mildly erythematous tissue surrounding.CT of the ankle without contrast showed soft tissue edema, exposed second and third screws in the distal tibia.  Patient was taken to the OR on 1/27 by Rod Can, M.D for irrigation and debridement of the left ankle and hardware removal. Currently has a wound VAC in place    Assessment and plan Infected left ankle wound with retained hardware, now with GPC  bacteremia Blood culture positive for gram-positive cocci in pairs-staph aureus, sensitivity pending Currently on vancomycin and cefazolin, infectious disease following  picc when blood cultures clear 48-72 hours  Left hip pain Given  bacteremia ordered MRI of the left hip to r/o septic arthritis,   Left hip MRI complete - reviewed by orthopedics, looks as expected for healing intertroch hip fracture No fluid collection or hip effusion to suggest septic hip Hip bursitis or arthritis likely for daignosis, plan as per orthopedics  Hypokalemia Replete potassium  Sinus tachycardia. Evaluated by Cardiology in November 11/15. Likely in the setting of low-grade fever, sepsis  Continue metoprolol at the current dose, in the setting of possible sepsis, patient was supposed to be taking 25 mg BID  at home  per previous cardiology notes Continue on gentle IV hydration , continues to have low-grade fever Increase metoprolol to 25 mg by mouth twice a day   Elevated lactic acid. Suspect from dehydration. Low suspicion for sepsis.  Anemia of chronic disease-baseline around 8.5-9, likely dehydrated yesterday with a hemoglobin of 9.8, now 7.3 will transfuse 1 unit of packed red blood cells Anemia panel consistent with anemia of chronic disease, TSH 0.9    Depression. -continue home Zoloft  History of grade I diastolic dysfunction on echo July 2016. No evidence for acute decompensation at present.   Elevated alk phos. Likely originating from the bone.  Normal in November when here for fracture. Etiology not clear.   She has a mildly elevated AST as well. Remainder of LFTs normal.  Check vitamin D levels  DVT prophylaxsis Lovenox  Code Status:      Code Status Orders        Start     Ordered   02/27/15 2134  Full code   Continuous     02/27/15 2133     Family Communication: Discussed in detail with the patient, all imaging results, lab results explained to the patient   Disposition Plan:  per orthopedics, transfuse 1 unit PRBC     Consultants:  Orthopedics  Infectious disease  Procedures: Infected left ankle wound with retained hardware.  PROCEDURE PERFORMED: 1. Debridement of left ankle wound. 2.  Removal of deep implant, left ankle.  Antibiotics: Anti-infectives    Start     Dose/Rate Route Frequency Ordered Stop   02/28/15 1400  ceFAZolin (ANCEF) IVPB 2 g/50 mL premix     2 g 100 mL/hr over 30 Minutes Intravenous 3 times per day 02/28/15 1237     02/28/15 0600  vancomycin (VANCOCIN) 500 mg in sodium chloride 0.9 % 100 mL IVPB     500 mg 100 mL/hr over 60 Minutes Intravenous Every 12 hours 02/27/15 2115     02/27/15 2200  piperacillin-tazobactam (ZOSYN) IVPB 3.375 g  Status:  Discontinued     3.375 g 12.5 mL/hr over 240 Minutes Intravenous Every 8 hours 02/27/15 2114 02/28/15 1237   02/27/15 1830  vancomycin (VANCOCIN) IVPB 1000 mg/200 mL premix  Status:  Discontinued     1,000 mg 200 mL/hr over 60 Minutes Intravenous  Once 02/27/15 1824 02/27/15 1839   02/27/15 1700  vancomycin (VANCOCIN) IVPB 1000 mg/200 mL premix     1,000 mg 200 mL/hr over 60 Minutes Intravenous To Surgery 02/27/15 1650 02/27/15 1915         HPI/Subjective: Continued to have low-grade fever, no active bleeding, tachycardic  Objective: Filed Vitals:   02/28/15 1530 02/28/15 2021 02/28/15 2251 03/01/15 0600  BP: 162/68 151/57 159/61 166/70  Pulse: 122 125 131 108  Temp: 99.6 F (37.6 C) 98.9 F (37.2 C)  98.7 F (37.1 C)  TempSrc:  Oral  Oral  Resp: _0 Height:      Weight:      SpO2: 100% 100%  100%    Intake/Output Summary (Last 24 hours) at 03/01/15 1120 Last data filed at 03/01/15 7096  Gross per 24 hour  Intake    240 ml  Output    100 ml  Net    140 ml    Exam:  General: No acute respiratory distress Lungs: Clear to auscultation bilaterally without wheezes or crackles Cardiovascular: Regular rate and rhythm without murmur gallop or rub normal S1 and S2 Abdomen: Nontender, nondistended, soft, bowel sounds positive, no rebound, no ascites, no appreciable mass MUSCULOSKELETAL: L ankle with 3 exposed screw heads over lateral incision; (+) periwound erythema and  seropurulent drainage. Medial and anterior incisions healed without evidence of infection. Ankle very stiff. 2+ DP pulse    Data Review   Micro Results Recent Results (from the past 240 hour(s))  Culture, blood (Routine X 2) w Reflex to ID Panel     Status: None (Preliminary result)   Collection Time: 02/27/15 10:45 AM  Result Value Ref Range Status   Specimen Description BLOOD RIGHT FOREARM  Final   Special Requests BOTTLES DRAWN AEROBIC AND ANAEROBIC 5CCS  Final   Culture  Setup Time   Final    GRAM POSITIVE COCCI IN CLUSTERS IN BOTH AEROBIC AND ANAEROBIC BOTTLES CRITICAL RESULT CALLED TO, READ BACK BY AND VERIFIED WITH: S WHITEHORN,RN_1  02/28/15 MKELLY    Culture STAPHYLOCOCCUS AUREUS  Final   Report Status PENDING  Incomplete  Culture, blood (Routine X 2) w  Reflex to ID Panel     Status: None (Preliminary result)   Collection Time: 02/27/15 10:50 AM  Result Value Ref Range Status   Specimen Description BLOOD RIGHT HAND  Final   Special Requests BOTTLES DRAWN AEROBIC AND ANAEROBIC 5CCS  Final   Culture  Setup Time   Final    GRAM POSITIVE COCCI IN CLUSTERS IN PAIRS IN BOTH AEROBIC AND ANAEROBIC BOTTLES CRITICAL RESULT CALLED TO, READ BACK BY AND VERIFIED WITH: S [email or web address]_0  02/28/15 MKELLY    Culture STAPHYLOCOCCUS AUREUS  Final   Report Status PENDING  Incomplete  Culture, routine-abscess     Status: None (Preliminary result)   Collection Time: 02/27/15  7:11 PM  Result Value Ref Range Status   Specimen Description ABSCESS LEFT ANKLE  Final   Special Requests PATIENT ON FOLLOWING VANC  Final   Gram Stain PENDING  Incomplete   Culture   Final    FEW STAPHYLOCOCCUS AUREUS Note: RIFAMPIN AND GENTAMICIN SHOULD NOT BE USED AS SINGLE DRUGS FOR TREATMENT OF STAPH INFECTIONS. Performed at [facility]    Report Status PENDING  Incomplete    Radiology Reports Dg Chest 1 View  02/27/2015  CLINICAL DATA:  Shortness of breath with exertion. EXAM: CHEST 1 VIEW  COMPARISON:  February 11, 2015 chest radiograph; chest CT December 25, 2014 FINDINGS: There is scarring in the right upper lobe and right base region. There is also scarring in the left base region. There is an ill-defined nodular opacity in the left upper lobe measuring 1.6 x 1.6 cm. No edema or consolidation. Heart size and pulmonary vascularity are normal. No adenopathy. No bone lesions. IMPRESSION: Areas of scarring, stable. Ill-defined nodular opacity left upper lobe measuring 1.6 x 1.6 cm. On prior CT, there was consolidation in this area. It is possible that this current nodular opacity in the left upper lobe represents residua from that infiltrate. An underlying mass which remains is a differential consideration, however, and given this possibility, noncontrast enhanced chest CT at this time is warranted. Lungs elsewhere clear.  No change in cardiac silhouette. Electronically Signed   By: Lowella Grip III M.D.   On: 02/27/2015 10:52   Dg Ankle Complete Left  02/27/2015  CLINICAL DATA:  Redness swelling and pain about the left ankle, status post ORIF in October 2016. EXAM: LEFT ANKLE COMPLETE - 3+ VIEW COMPARISON:  None. FINDINGS: There has been a prior tri malleolar fracture fixation. There is diffuse osteopenia. The distal tibial screws are in good position. The lateral plate and screw fixation of the distal fibula is also with satisfactory radiologic positioning. There is no evidence of acute fracture. There is no evidence of radiographically apparent loosening. There is mild callus formation about the fracture lines. There is bilateral ankle soft tissue swelling. No soft tissue emphysema. IMPRESSION: Status post tri malleolar fracture fixation with normal appearance of the orthopedic hardware. Osteopenia. Mild callus formation at the fracture lines. No evidence of acute fracture. Bimalleolar soft tissue swelling without evidence of soft tissue emphysema. Electronically Signed   By: Fidela Salisbury M.D.   On: 02/27/2015 10:49   Ct Ankle Left Wo Contrast  02/27/2015  CLINICAL DATA:  Left foot pain with redness and exudate. Protruding fixation screws. Prior open reduction and internal fixation of fractures of the distal tibia and fibula. EXAM: CT OF THE LEFT ANKLE WITHOUT CONTRAST TECHNIQUE: Multidetector CT imaging of the left ankle was performed according to the standard protocol. Multiplanar CT image reconstructions were also  generated. COMPARISON:  Radiographs dated 02/27/2015 FINDINGS: There are old healed fractures of distal tibia and fibula. 3 screws in the distal tibia are in good position. Side plate and multiple screws appear in good position in the distal tibia. No evidence of loosening of the screws. There is soft tissue edema anterior to the distal tibia and anterior lateral aspect of the proximal talus. No bone destruction to suggest osteomyelitis. No acute fractures. The tendons around the ankle appear normal. There is no ankle or subtalar joint effusion. The skin is quite thin over the second and third screws in the distal fibula counting from proximal to distal. These may be the exposed screw heads. IMPRESSION: No acute osseous abnormality. Slight soft tissue edema adjacent to the distal fibula and at the anterior lateral aspect the proximal talus just anterior to the fibula. No loosening of the hardware. The heads of the second and third screws in the distal tibia may be exposed due to break down of overlying skin. Electronically Signed   By: Lorriane Shire M.D.   On: 02/27/2015 15:34   Dg Ankle Left Port  02/27/2015  CLINICAL DATA:  Status post hardware removal with incision and drainage of left ankle. EXAM: PORTABLE LEFT ANKLE - 2 VIEW COMPARISON:  02/27/2015, earlier same day. FINDINGS: Interval retrieval of lateral fibular plate evident. Cannulated compression screws remain in the distal tibia, as before. Lateral soft tissue swelling is evident. Bones are diffusely  demineralized. Vacuum device overlies the lateral soft tissues. IMPRESSION: Status post retrieval of tibial plate Electronically Signed   By: Misty Stanley M.D.   On: 02/27/2015 20:33   Dg Hip Unilat With Pelvis 2-3 Views Left  02/27/2015  CLINICAL DATA:  Left hip surgery in November. Throbbing left hip pain medially. EXAM: DG HIP (WITH OR WITHOUT PELVIS) 2-3V LEFT COMPARISON:  12/25/2014 FINDINGS: Remote left intertrochanteric hip fracture transfixed with a intra medullary nail and interlocking cannulated femoral neck screw. Ununited displaced lesser trochanteric fracture fragment. Subtle lucency in the proximal left femur in the region of the lesser trochanter fracture which may be artifactual secondary to overlapping lesser trochanteric fracture fragment versus incomplete healing. No other fracture or dislocation. No hardware failure or complication. Mild osteoarthritis of the left hip. IMPRESSION: 1. Remote left intertrochanteric hip fracture transfixed with a intra medullary nail and interlocking cannulated femoral neck screw. Ununited displaced lesser trochanteric fracture fragment. Subtle lucency in the proximal left femur in the region of the lesser trochanter fracture which may be artifactual secondary to overlapping lesser trochanteric fracture fragment versus incomplete healing. Electronically Signed   By: Kathreen Devoid   On: 02/27/2015 10:51     CBC  Recent Labs Lab 02/27/15 0939 02/28/15 0027 03/01/15 0655  WBC 9.8 9.5 8.1  HGB 9.8* 8.2* 7.3*  HCT 29.8* 25.8* 22.4*  PLT 534* 439* 456*  MCV 87.1 87.8 88.9  MCH 28.7 27.9 29.0  MCHC 32.9 31.8 32.6  RDW 17.6* 17.2* 17.3*  LYMPHSABS 0.7  --   --   MONOABS 0.6  --   --   EOSABS 0.0  --   --   BASOSABS 0.0  --   --     Chemistries   Recent Labs Lab 02/27/15 1038 02/28/15 0027 03/01/15 0655  NA 141 140 138  K 3.5 3.3* 3.4*  CL 105 104 102  CO2 _0 GLUCOSE 124* 161* 102*  BUN _1 CREATININE 0.88 0.77 0.73   CALCIUM 8.7* 8.6* 8.2*  AST 85*  --  57*  ALT 37  --  43  ALKPHOS 497*  --  715*  BILITOT 0.6  --  0.6   ------------------------------------------------------------------------------------------------------------------ estimated creatinine clearance is 51.8 mL/min (by C-G formula based on Cr of 0.73). ------------------------------------------------------------------------------------------------------------------ No results for input(s): HGBA1C in the last 72 hours. ------------------------------------------------------------------------------------------------------------------ No results for input(s): CHOL, HDL, LDLCALC, TRIG, CHOLHDL, LDLDIRECT in the last 72 hours. ------------------------------------------------------------------------------------------------------------------  Recent Labs  02/28/15 1138  TSH 0.959   ------------------------------------------------------------------------------------------------------------------  Recent Labs  02/28/15 1138  VITAMINB12 633  FOLATE 14.4  FERRITIN 568*  TIBC 116*  IRON 8*  RETICCTPCT <0.4*    Coagulation profile No results for input(s): INR, PROTIME in the last 168 hours.  No results for input(s): DDIMER in the last 72 hours.  Cardiac Enzymes No results for input(s): CKMB, TROPONINI, MYOGLOBIN in the last 168 hours.  Invalid input(s): CK ------------------------------------------------------------------------------------------------------------------ Invalid input(s): POCBNP   CBG: No results for input(s): GLUCAP in the last 168 hours.     Studies: Ct Ankle Left Wo Contrast  02/27/2015  CLINICAL DATA:  Left foot pain with redness and exudate. Protruding fixation screws. Prior open reduction and internal fixation of fractures of the distal tibia and fibula. EXAM: CT OF THE LEFT ANKLE WITHOUT CONTRAST TECHNIQUE: Multidetector CT imaging of the left ankle was performed according to the standard protocol.  Multiplanar CT image reconstructions were also generated. COMPARISON:  Radiographs dated 02/27/2015 FINDINGS: There are old healed fractures of distal tibia and fibula. 3 screws in the distal tibia are in good position. Side plate and multiple screws appear in good position in the distal tibia. No evidence of loosening of the screws. There is soft tissue edema anterior to the distal tibia and anterior lateral aspect of the proximal talus. No bone destruction to suggest osteomyelitis. No acute fractures. The tendons around the ankle appear normal. There is no ankle or subtalar joint effusion. The skin is quite thin over the second and third screws in the distal fibula counting from proximal to distal. These may be the exposed screw heads. IMPRESSION: No acute osseous abnormality. Slight soft tissue edema adjacent to the distal fibula and at the anterior lateral aspect the proximal talus just anterior to the fibula. No loosening of the hardware. The heads of the second and third screws in the distal tibia may be exposed due to break down of overlying skin. Electronically Signed   By: Lorriane Shire M.D.   On: 02/27/2015 15:34   Dg Ankle Left Port  02/27/2015  CLINICAL DATA:  Status post hardware removal with incision and drainage of left ankle. EXAM: PORTABLE LEFT ANKLE - 2 VIEW COMPARISON:  02/27/2015, earlier same day. FINDINGS: Interval retrieval of lateral fibular plate evident. Cannulated compression screws remain in the distal tibia, as before. Lateral soft tissue swelling is evident. Bones are diffusely demineralized. Vacuum device overlies the lateral soft tissues. IMPRESSION: Status post retrieval of tibial plate Electronically Signed   By: Misty Stanley M.D.   On: 02/27/2015 20:33      Lab Results  Component Value Date   HGBA1C 5.3 08/07/2014   Lab Results  Component Value Date   LDLCALC 69 08/07/2014   CREATININE 0.73 03/01/2015       Scheduled Meds: . aspirin EC  325 mg Oral Daily  .  calcium carbonate  1,250 mg Oral Q breakfast  .  ceFAZolin (ANCEF) IV  2 g Intravenous 3 times per day  . enoxaparin (LOVENOX) injection  40  mg Subcutaneous Q24H  . metoprolol tartrate  12.5 mg Oral BID  . pantoprazole  40 mg Oral QAC breakfast  . sertraline  100 mg Oral Daily  . sodium chloride  250 mL Intravenous Once  . sodium chloride flush  3 mL Intravenous Q12H  . vancomycin  500 mg Intravenous Q12H   Continuous Infusions: . 0.9 % NaCl with KCl 20 mEq / L 125 mL/hr at 03/01/15 6378    Active Problems:   Wound dehiscence   Tachycardia   Deep incisional surgical site infection   Wound cellulitis    Time spent: 37 minutes   [facility] Pager [phone]. If 7PM-7AM, please contact night-coverage at [email or web address], password [facility] 03/01/2015, 11:20 AM  LOS: 2 days

## 2015-03-01 NOTE — Progress Notes (Signed)
Orthopedics Progress Note  Subjective: Still complaining of pain in the left hip  Objective:  Filed Vitals:   02/28/15 2251 03/01/15 0600  BP: 159/61 166/70  Pulse: 131 108  Temp:  98.7 F (37.1 C)  Resp:  18    General: Awake and alert  Musculoskeletal: left hip incision clean and dry no erythema and no fluctance Neurovascularly intact  Lab Results  Component Value Date   WBC 8.1 03/01/2015   HGB 7.3* 03/01/2015   HCT 22.4* 03/01/2015   MCV 88.9 03/01/2015   PLT 456* 03/01/2015       Component Value Date/Time   NA 138 03/01/2015 0655   NA 137 12/12/2014   K 3.4* 03/01/2015 0655   CL 102 03/01/2015 0655   CO2 27 03/01/2015 0655   GLUCOSE 102* 03/01/2015 0655   BUN 8 03/01/2015 0655   BUN 9 12/12/2014   CREATININE 0.73 03/01/2015 0655   CREATININE 1.0 12/12/2014   CALCIUM 8.2* 03/01/2015 0655   GFRNONAA >60 03/01/2015 0655   GFRAA >60 03/01/2015 0655    Lab Results  Component Value Date   INR 1.10 12/25/2014    Assessment/Plan: POD #3 s/p Procedure(s): HARDWARE REMOVAL OF ANKLE AND I AND D APPLICATION OF WOUND VAC Left hip MRI complete - to me it looks as expected for healing intertroch hip fracture No fluid collection or hip effusion to suggest septic hip Hip bursitis or arthritis likely for daignosis Left foot with wound vac IV antibiotics per ID, cultures P Acute blood loss anemia- likely will need transfusion - patient requesting to d/c O2  Recommend continuing O2 pending transfusion to increase O2 carry capactiy  Viviann Spare R. Ranell Patrick, MD 03/01/2015 9:27 AM

## 2015-03-02 ENCOUNTER — Encounter (HOSPITAL_COMMUNITY): Payer: Self-pay | Admitting: Orthopedic Surgery

## 2015-03-02 DIAGNOSIS — M25552 Pain in left hip: Secondary | ICD-10-CM

## 2015-03-02 DIAGNOSIS — R509 Fever, unspecified: Secondary | ICD-10-CM

## 2015-03-02 LAB — COMPREHENSIVE METABOLIC PANEL
ALT: 32 U/L (ref 14–54)
AST: 98 U/L — ABNORMAL HIGH (ref 15–41)
Albumin: 1.8 g/dL — ABNORMAL LOW (ref 3.5–5.0)
Alkaline Phosphatase: 645 U/L — ABNORMAL HIGH (ref 38–126)
Anion gap: 6 (ref 5–15)
BUN: 5 mg/dL — ABNORMAL LOW (ref 6–20)
CHLORIDE: 105 mmol/L (ref 101–111)
CO2: 27 mmol/L (ref 22–32)
CREATININE: 0.69 mg/dL (ref 0.44–1.00)
Calcium: 8.3 mg/dL — ABNORMAL LOW (ref 8.9–10.3)
GFR calc non Af Amer: >60 mL/min (ref >60)
Glucose, Bld: 116 mg/dL — ABNORMAL HIGH (ref 65–99)
POTASSIUM: 4.2 mmol/L (ref 3.5–5.1)
SODIUM: 138 mmol/L (ref 135–145)
Total Bilirubin: 0.6 mg/dL (ref 0.3–1.2)
Total Protein: 5.6 g/dL — ABNORMAL LOW (ref 6.5–8.1)

## 2015-03-02 LAB — CBC
HCT: 26.3 % — ABNORMAL LOW (ref 36.0–46.0)
Hemoglobin: 8.6 g/dL — ABNORMAL LOW (ref 12.0–15.0)
MCH: 28.5 pg (ref 26.0–34.0)
MCHC: 32.7 g/dL (ref 30.0–36.0)
MCV: 87.1 fL (ref 78.0–100.0)
PLATELETS: 449 10*3/uL — AB (ref 150–400)
RBC: 3.02 MIL/uL — AB (ref 3.87–5.11)
RDW: 17.7 % — AB (ref 11.5–15.5)
WBC: 8 10*3/uL (ref 4.0–10.5)

## 2015-03-02 LAB — CULTURE, BLOOD (ROUTINE X 2)

## 2015-03-02 LAB — TYPE AND SCREEN
ABO/RH(D): A POS
Antibody Screen: NEGATIVE
Unit division: 0

## 2015-03-02 LAB — CULTURE, ROUTINE-ABSCESS

## 2015-03-02 NOTE — Progress Notes (Signed)
[facility]   Reason for visit: Follow up on  Post op infection with hardware removal  Interval History: complains of pain. MRI without significant concerns.  Tmax 100.5.    Physical Exam: Constitutional:  Filed Vitals:   03/01/15 2019 03/02/15 0552  BP: 126/43 158/63  Pulse: 107 106  Temp: 99.7 F (37.6 C) 99.1 F (37.3 C)  Resp: 16 18   patient appears in NAD Respiratory: Normal respiratory effort; CTA B Cardiovascular: RRR  Review of Systems: Constitutional: negative for chills Gastrointestinal: negative for nausea and diarrhea  Lab Results  Component Value Date   WBC 8.0 03/02/2015   HGB 8.6* 03/02/2015   HCT 26.3* 03/02/2015   MCV 87.1 03/02/2015   PLT 449* 03/02/2015    Lab Results  Component Value Date   CREATININE 0.69 03/02/2015   BUN 5* 03/02/2015   NA 138 03/02/2015   K 4.2 03/02/2015   CL 105 03/02/2015   CO2 27 03/02/2015    Lab Results  Component Value Date   ALT 32 03/02/2015   AST 98* 03/02/2015   ALKPHOS 645* 03/02/2015     Microbiology: Recent Results (from the past 240 hour(s))  Culture, blood (Routine X 2) w Reflex to ID Panel     Status: None   Collection Time: 02/27/15 10:45 AM  Result Value Ref Range Status   Specimen Description BLOOD RIGHT FOREARM  Final   Special Requests BOTTLES DRAWN AEROBIC AND ANAEROBIC 5CCS  Final   Culture  Setup Time   Final    GRAM POSITIVE COCCI IN CLUSTERS IN BOTH AEROBIC AND ANAEROBIC BOTTLES CRITICAL RESULT CALLED TO, READ BACK BY AND VERIFIED WITH: S WHITEHORN,RN@0330  02/28/15 MKELLY    Culture STAPHYLOCOCCUS AUREUS  Final   Report Status 03/02/2015 FINAL  Final   Organism ID, Bacteria STAPHYLOCOCCUS AUREUS  Final      Susceptibility   Staphylococcus aureus - MIC*    CIPROFLOXACIN <=0.5 SENSITIVE Sensitive     ERYTHROMYCIN <=0.25 SENSITIVE Sensitive     GENTAMICIN <=0.5 SENSITIVE Sensitive     OXACILLIN 0.5 SENSITIVE Sensitive     TETRACYCLINE <=1 SENSITIVE  Sensitive     VANCOMYCIN 1 SENSITIVE Sensitive     TRIMETH/SULFA <=10 SENSITIVE Sensitive     CLINDAMYCIN <=0.25 SENSITIVE Sensitive     RIFAMPIN <=0.5 SENSITIVE Sensitive     Inducible Clindamycin NEGATIVE Sensitive     * STAPHYLOCOCCUS AUREUS  Culture, blood (Routine X 2) w Reflex to ID Panel     Status: None   Collection Time: 02/27/15 10:50 AM  Result Value Ref Range Status   Specimen Description BLOOD RIGHT HAND  Final   Special Requests BOTTLES DRAWN AEROBIC AND ANAEROBIC 5CCS  Final   Culture  Setup Time   Final    GRAM POSITIVE COCCI IN CLUSTERS IN PAIRS IN BOTH AEROBIC AND ANAEROBIC BOTTLES CRITICAL RESULT CALLED TO, READ BACK BY AND VERIFIED WITH: S WHITEHORN@0156  02/28/15 MKELLY    Culture   Final    STAPHYLOCOCCUS AUREUS SUSCEPTIBILITIES PERFORMED ON PREVIOUS CULTURE WITHIN THE LAST 5 DAYS.    Report Status 03/02/2015 FINAL  Final  Anaerobic culture     Status: None (Preliminary result)   Collection Time: 02/27/15  7:11 PM  Result Value Ref Range Status   Specimen Description ABSCESS LEFT ANKLE  Final   Special Requests PATIENT ON FOLLOWING VANC  Final   Gram Stain PENDING  Incomplete   Culture   Final  NO ANAEROBES ISOLATED; CULTURE IN PROGRESS FOR 5 DAYS Performed at [facility]    Report Status PENDING  Incomplete  Culture, routine-abscess     Status: None (Preliminary result)   Collection Time: 02/27/15  7:11 PM  Result Value Ref Range Status   Specimen Description ABSCESS LEFT ANKLE  Final   Special Requests PATIENT ON FOLLOWING VANC  Final   Gram Stain PENDING  Incomplete   Culture   Final    FEW STAPHYLOCOCCUS AUREUS Note: RIFAMPIN AND GENTAMICIN SHOULD NOT BE USED AS SINGLE DRUGS FOR TREATMENT OF STAPH INFECTIONS. Performed at [facility]    Report Status PENDING  Incomplete    Impression/Plan:  1. Post op infection - growing MSSA.  Does need TTE.  Repeat blood cultures sent and would wait until tomorrow to be sure negative  before placing picc line.  Continue with cefazolin through March 9th Antibiotics per SNF protocol We will arrange follow up in 3-4 weeks  2. Left hip pain - no signs of infection.

## 2015-03-02 NOTE — Progress Notes (Signed)
Occupational Therapy Treatment Patient Details Name: [name] MRN: [id number] DOB: 10-26-1945 Today's Date: 03/02/2015    History of present illness This 70 y.o. female admitted 02/27/15 with increased pain Lt hip and ankle as well as exposed hardware Lt ankle. She underwent removal of ankle hardware and application of wound VAC lt ankle 02/27/15. PMH includes Lt ankle fx 10/16 due to fall.Fall at Thanksgiving resulting in Lt hip fracture, and underwent ORIF. Opoid abuse, OCD, sepsis, anxiety, dizziness, depressive disorder, H/O MRSA    OT comments  Pt progressing slowly towards occupational therapy goals. Pt demonstrated self-limiting behaviors and decreased safety awareness during session today. Pt completed stand-pivot transfer with min assist and peri care with max assist and required max verbal cues for safe hand placement, proper DME use, and step sequence. Pt refusing SNF placement and reported that her son would be able to assist her, but he is unable to come to the hospital to demonstrate ability to provided necessary level of care because he does not have a drivers license. Continue with current POC and recommendations. Will continue to follow acutely.   Follow Up Recommendations  SNF;Supervision/Assistance - 24 hour    Equipment Recommendations  3 in 1 bedside comode    Recommendations for Other Services      Precautions / Restrictions Precautions Precautions: Fall Precaution Comments: two falls at home 10/16 (ankle fracture - fell in yard), 11/16 fell at daughter's (hip fracture).  Denies other falls  Required Braces or Orthoses: Other Brace/Splint Other Brace/Splint: CAM walker  Restrictions Weight Bearing Restrictions: Yes LLE Weight Bearing: Weight bearing as tolerated       Mobility Bed Mobility Overal bed mobility: Needs Assistance Bed Mobility: Supine to Sit;Sit to Supine     Supine to sit: Mod assist Sit to supine: Mod assist   General bed mobility  comments: Mod assist to progress LLE off bed, scoot hips to EOB with chuck pad, and to support trunk to come to sitting position. Pt demonstrating self-limiting behavior and would follow commands inconsistently. Max verbal cues for hand placement and sequencing to progress to EOB.  Transfers Overall transfer level: Needs assistance Equipment used: Rolling walker (2 wheeled) Transfers: Sit to/from [employer] Sit to Stand: Min guard Stand pivot transfers: Min assist       General transfer comment: Pt demonstrated impulsivity from bed to [facility] due to sudden need to void. Required max verbal cues to hold onto RW with both hands instead of bed/BSC. Verbal cues for step sequence and safety. Pt denied attempting other mobility or ADLs due to pain.    Balance Overall balance assessment: Needs assistance Sitting-balance support: No upper extremity supported;Feet supported Sitting balance-Leahy Scale: Fair     Standing balance support: Bilateral upper extremity supported;During functional activity Standing balance-Leahy Scale: Poor Standing balance comment: Anterior lean, flexed trunk, required UE support and cues to hold onto RW isntead of other objects during transfers                   ADL Overall ADL's : Needs assistance/impaired                         Toilet Transfer: Minimal assistance;Cueing for safety;Cueing for sequencing;Stand-pivot;BSC;RW Toilet Transfer Details (indicate cue type and reason): Cues to hold onto RW instead of BSC and bed, cues for step sequence Toileting- Clothing Manipulation and Hygiene: Maximal assistance;Cueing for safety;Sit to/from stand Toileting - Clothing Manipulation Details (indicate cue type  and reason): Cues to put both hand on RW not bed     Functional mobility during ADLs: Moderate assistance;Rolling walker;Cueing for safety;Cueing for sequencing General ADL Comments: Pt able to progress to EOB and complete stand-pivot  transfer with min assist although pt very unsteady on feet and in a very flexed position. Pt required max verbal cues for hip extension, step sequence, safe hand placement and appropriate use of RW.       Vision                     Perception     Praxis      Cognition   Behavior During Therapy: Anxious;Flat affect Overall Cognitive Status: Within Functional Limits for tasks assessed                       Extremity/Trunk Assessment               Exercises     Shoulder Instructions       General Comments      Pertinent Vitals/ Pain       Pain Assessment: 0-10 Pain Score: 9  Pain Location: L hip and ankle despite pain medication Pain Descriptors / Indicators: Aching;Sore Pain Intervention(s): Limited activity within patient's tolerance;Monitored during session;Premedicated before session;Repositioned;Ice applied  Home Living                                          Prior Functioning/Environment              Frequency Min 2X/week     Progress Toward Goals  OT Goals(current goals can now be found in the care plan section)  Progress towards OT goals: Progressing toward goals  Acute Rehab OT Goals Patient Stated Goal: to go home OT Goal Formulation: With patient Time For Goal Achievement: 03/14/15 Potential to Achieve Goals: Good ADL Goals Pt Will Perform Grooming: with min assist;standing Pt Will Perform Lower Body Bathing: with min assist;with adaptive equipment;sit to/from stand Pt Will Perform Upper Body Dressing: with set-up;sitting Pt Will Perform Lower Body Dressing: with min assist;sit to/from stand;with adaptive equipment Pt Will Transfer to Toilet: with min assist;ambulating;regular height toilet;bedside commode;grab bars Pt Will Perform Toileting - Clothing Manipulation and hygiene: with min assist;sit to/from stand  Plan Discharge plan remains appropriate    Co-evaluation                 End of  Session Equipment Utilized During Treatment: Gait belt;Rolling walker;Other (comment) (CAM walker)   Activity Tolerance Patient limited by pain   Patient Left in bed;with call bell/phone within reach;with bed alarm set   Nurse Communication Mobility status        Time: 6962-9528 OT Time Calculation (min): 25 min  Charges: OT General Charges $OT Visit: 1 Procedure OT Treatments $Self Care/Home Management : 23-37 mins  Nils Pyle, OTR/L Pager: [phone] 03/02/2015, 3:06 PM

## 2015-03-02 NOTE — Progress Notes (Signed)
[facility] PROGRESS NOTE  SHAQUILLE MURDY GYJ:[id number] DOB: 1945-02-21 DOA: 02/27/2015 PCP: [facility]  Length of stay: 3   Assessment/Plan: Active Problems:   Wound dehiscence   Tachycardia   Deep incisional surgical site infection   Wound cellulitis   Hip pain, acute   Postop check   Acute blood loss anemia   Brief summary 70 year old female who had a fall with left ankle fracture in October 2016 which was treated with ORIF of the left ankle in [place]. 6 weeks later, she had a second fall which resulted in a left hip fracture which was treated in [place]  IM nail L IT fx by Dr. Doran Durand 12/25/2014 with chronic trochanteric bursitis She reported she been wearing an ankle brace and was continuously over the last few months and a family member noticed today when she was taking a shower that she had exposed hardware with erythema and periodic discharge from the left ankle. She presented to the emergency department where she had obvious exposed hardware. She was afebrile without leukocytosis however her CRP and ESR very elevated. On exam, she was in no acute distress, heart rate tachycardic but regular,  Her left lower extremity had trace pitting edema she had to exposed screws along the left lateral aspect of her left ankle both of which had a small amount of purulence and boggy mildly erythematous tissue surrounding.CT of the ankle without contrast showed soft tissue edema, exposed second and third screws in the distal tibia.  Patient was taken to the OR on 1/27 by Rod Can, M.D for irrigation and debridement of the left ankle and hardware removal. Currently has a wound VAC in place    Assessment and plan Infected left ankle wound with retained hardware, now with GPC  bacteremia Blood culture positive for gram-positive cocci in pairs-staph aureus, sensitivity pending Currently on vancomycin and cefazolin, infectious disease following   Repeat blood cultures obtained 1/29, no growth so far, picc when blood cultures clear 48-72 hours Patient would need 6 weeks of IV antibiotics  Left hip pain Given bacteremia patient underwent MRI of the left hip to r/o septic arthritis,   Left hip MRI complete - reviewed by orthopedics, looks as expected for healing intertroch hip fracture No fluid collection or hip effusion to suggest septic hip Hip bursitis or arthritis likely for daignosis, plan as per orthopedics  Hypokalemia Replete potassium  Sinus tachycardia. Evaluated by Cardiology in November 11/15. Likely in the setting of low-grade fever, sepsis  Continue metoprolol at the current dose, in the setting of possible sepsis, patient was supposed to be taking 25 mg BID  at home  per previous cardiology notes Continue on gentle IV hydration , continues to have low-grade fever Continue metoprolol to 25 mg by mouth twice a day   Elevated lactic acid. Suspect from dehydration. Low suspicion for sepsis.  Anemia of chronic disease-baseline around 8.5-9, likely dehydrated yesterday with a hemoglobin of 9.8, hemoglobin dropped to 7.3 , status post transfusion of 1 unit, hemoglobin now 8.6 Anemia panel consistent with anemia of chronic disease, TSH 0.9    Depression. -continue home Zoloft  History of grade I diastolic dysfunction on echo July 2016. No evidence for acute decompensation at present.   Elevated alk phos. Likely originating from the bone.  Normal in November when here for fracture. Etiology not clear.   She has a mildly elevated AST as well. Remainder of LFTs normal.  Check  vitamin D levels  DVT prophylaxsis Lovenox  Code Status:      Code Status Orders        Start     Ordered   02/27/15 2134  Full code   Continuous     02/27/15 2133     Family Communication: Discussed in detail with the patient, all imaging results, lab results explained to the patient   Disposition Plan:  per orthopedics, , need 6  weeks of IV antibiotics, PICC line in the next 1-2 days     Consultants:  Orthopedics  Infectious disease  Procedures: Infected left ankle wound with retained hardware.  PROCEDURE PERFORMED: 1. Debridement of left ankle wound. 2. Removal of deep implant, left ankle.  Antibiotics: Anti-infectives    Start     Dose/Rate Route Frequency Ordered Stop   02/28/15 1400  ceFAZolin (ANCEF) IVPB 2 g/50 mL premix     2 g 100 mL/hr over 30 Minutes Intravenous 3 times per day 02/28/15 1237     02/28/15 0600  vancomycin (VANCOCIN) 500 mg in sodium chloride 0.9 % 100 mL IVPB     500 mg 100 mL/hr over 60 Minutes Intravenous Every 12 hours 02/27/15 2115     02/27/15 2200  piperacillin-tazobactam (ZOSYN) IVPB 3.375 g  Status:  Discontinued     3.375 g 12.5 mL/hr over 240 Minutes Intravenous Every 8 hours 02/27/15 2114 02/28/15 1237   02/27/15 1830  vancomycin (VANCOCIN) IVPB 1000 mg/200 mL premix  Status:  Discontinued     1,000 mg 200 mL/hr over 60 Minutes Intravenous  Once 02/27/15 1824 02/27/15 1839   02/27/15 1700  vancomycin (VANCOCIN) IVPB 1000 mg/200 mL premix     1,000 mg 200 mL/hr over 60 Minutes Intravenous To Surgery 02/27/15 1650 02/27/15 1915         HPI/Subjective: Complaining of ankle pain, low-grade fever overnight, no cough no chest pain or shortness of breath  Objective: Filed Vitals:   03/01/15 1500 03/01/15 1800 03/01/15 2019 03/02/15 0552  BP: 150/51 160/53 126/43 158/63  Pulse: 102 115 107 106  Temp: 100.3 F (37.9 C) 100.5 F (38.1 C) 99.7 F (37.6 C) 99.1 F (37.3 C)  TempSrc: Oral Axillary Oral Oral  Resp: '16 16 16 18  ' Height:      Weight:      SpO2: 100% 100% 95% 96%    Intake/Output Summary (Last 24 hours) at 03/02/15 1042 Last data filed at 03/01/15 1800  Gross per 24 hour  Intake    370 ml  Output      0 ml  Net    370 ml    Exam:  General: No acute respiratory distress Lungs: Clear to auscultation bilaterally without wheezes or  crackles Cardiovascular: Regular rate and rhythm without murmur gallop or rub normal S1 and S2 Abdomen: Nontender, nondistended, soft, bowel sounds positive, no rebound, no ascites, no appreciable mass MUSCULOSKELETAL: L ankle with 3 exposed screw heads over lateral incision; (+) periwound erythema and seropurulent drainage. Medial and anterior incisions healed without evidence of infection. Ankle very stiff. 2+ DP pulse    Data Review   Micro Results Recent Results (from the past 240 hour(s))  Culture, blood (Routine X 2) w Reflex to ID Panel     Status: None   Collection Time: 02/27/15 10:45 AM  Result Value Ref Range Status   Specimen Description BLOOD RIGHT FOREARM  Final   Special Requests BOTTLES DRAWN AEROBIC AND ANAEROBIC 5CCS  Final   Culture  Setup Time   Final    GRAM POSITIVE COCCI IN CLUSTERS IN BOTH AEROBIC AND ANAEROBIC BOTTLES CRITICAL RESULT CALLED TO, READ BACK BY AND VERIFIED WITH: S WHITEHORN,RN'@0330'  02/28/15 MKELLY    Culture STAPHYLOCOCCUS AUREUS  Final   Report Status 03/02/2015 FINAL  Final   Organism ID, Bacteria STAPHYLOCOCCUS AUREUS  Final      Susceptibility   Staphylococcus aureus - MIC*    CIPROFLOXACIN <=0.5 SENSITIVE Sensitive     ERYTHROMYCIN <=0.25 SENSITIVE Sensitive     GENTAMICIN <=0.5 SENSITIVE Sensitive     OXACILLIN 0.5 SENSITIVE Sensitive     TETRACYCLINE <=1 SENSITIVE Sensitive     VANCOMYCIN 1 SENSITIVE Sensitive     TRIMETH/SULFA <=10 SENSITIVE Sensitive     CLINDAMYCIN <=0.25 SENSITIVE Sensitive     RIFAMPIN <=0.5 SENSITIVE Sensitive     Inducible Clindamycin NEGATIVE Sensitive     * STAPHYLOCOCCUS AUREUS  Culture, blood (Routine X 2) w Reflex to ID Panel     Status: None   Collection Time: 02/27/15 10:50 AM  Result Value Ref Range Status   Specimen Description BLOOD RIGHT HAND  Final   Special Requests BOTTLES DRAWN AEROBIC AND ANAEROBIC 5CCS  Final   Culture  Setup Time   Final    GRAM POSITIVE COCCI IN CLUSTERS IN PAIRS IN  BOTH AEROBIC AND ANAEROBIC BOTTLES CRITICAL RESULT CALLED TO, READ BACK BY AND VERIFIED WITH: S WHITEHORN'@0156'  02/28/15 MKELLY    Culture   Final    STAPHYLOCOCCUS AUREUS SUSCEPTIBILITIES PERFORMED ON PREVIOUS CULTURE WITHIN THE LAST 5 DAYS.    Report Status 03/02/2015 FINAL  Final  Anaerobic culture     Status: None (Preliminary result)   Collection Time: 02/27/15  7:11 PM  Result Value Ref Range Status   Specimen Description ABSCESS LEFT ANKLE  Final   Special Requests PATIENT ON FOLLOWING VANC  Final   Gram Stain PENDING  Incomplete   Culture   Final    NO ANAEROBES ISOLATED; CULTURE IN PROGRESS FOR 5 DAYS Performed at [facility]    Report Status PENDING  Incomplete  Culture, routine-abscess     Status: None (Preliminary result)   Collection Time: 02/27/15  7:11 PM  Result Value Ref Range Status   Specimen Description ABSCESS LEFT ANKLE  Final   Special Requests PATIENT ON FOLLOWING VANC  Final   Gram Stain PENDING  Incomplete   Culture   Final    FEW STAPHYLOCOCCUS AUREUS Note: RIFAMPIN AND GENTAMICIN SHOULD NOT BE USED AS SINGLE DRUGS FOR TREATMENT OF STAPH INFECTIONS. Performed at [facility]    Report Status PENDING  Incomplete    Radiology Reports Dg Chest 1 View  02/27/2015  CLINICAL DATA:  Shortness of breath with exertion. EXAM: CHEST 1 VIEW COMPARISON:  February 11, 2015 chest radiograph; chest CT December 25, 2014 FINDINGS: There is scarring in the right upper lobe and right base region. There is also scarring in the left base region. There is an ill-defined nodular opacity in the left upper lobe measuring 1.6 x 1.6 cm. No edema or consolidation. Heart size and pulmonary vascularity are normal. No adenopathy. No bone lesions. IMPRESSION: Areas of scarring, stable. Ill-defined nodular opacity left upper lobe measuring 1.6 x 1.6 cm. On prior CT, there was consolidation in this area. It is possible that this current nodular opacity in the left upper lobe  represents residua from that infiltrate. An underlying mass which remains is a differential consideration, however, and given this possibility,  noncontrast enhanced chest CT at this time is warranted. Lungs elsewhere clear.  No change in cardiac silhouette. Electronically Signed   By: Lowella Grip III M.D.   On: 02/27/2015 10:52   Dg Ankle Complete Left  02/27/2015  CLINICAL DATA:  Redness swelling and pain about the left ankle, status post ORIF in October 2016. EXAM: LEFT ANKLE COMPLETE - 3+ VIEW COMPARISON:  None. FINDINGS: There has been a prior tri malleolar fracture fixation. There is diffuse osteopenia. The distal tibial screws are in good position. The lateral plate and screw fixation of the distal fibula is also with satisfactory radiologic positioning. There is no evidence of acute fracture. There is no evidence of radiographically apparent loosening. There is mild callus formation about the fracture lines. There is bilateral ankle soft tissue swelling. No soft tissue emphysema. IMPRESSION: Status post tri malleolar fracture fixation with normal appearance of the orthopedic hardware. Osteopenia. Mild callus formation at the fracture lines. No evidence of acute fracture. Bimalleolar soft tissue swelling without evidence of soft tissue emphysema. Electronically Signed   By: Fidela Salisbury M.D.   On: 02/27/2015 10:49   Mr Hip Left W Wo Contrast  03/01/2015  CLINICAL DATA:  Multiple falls in the last several months causing left ankle fracture and left hip fracture. 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 EXAM: MRI OF THE LEFT HIP WITHOUT AND WITH CONTRAST TECHNIQUE: Multiplanar, multisequence MR imaging was performed both before and after administration of intravenous contrast. CONTRAST:  21m MULTIHANCE GADOBENATE DIMEGLUMINE 529 MG/ML IV SOLN COMPARISON:  02/27/2015 FINDINGS: Field heterogeneity due to the hip implant. Bones: Indistinct original intertrochanteric fracture traversed  by the IM nail and screw. There is some low-level edema in the left hip intertrochanteric region and tracking along the stem of the implant. No new fracture identified. Articular cartilage and labrum Articular cartilage: Moderate degenerative chondral thinning in the left hip joint. Labrum:  Indeterminate due to motion artifact and metal artifact. Joint or bursal effusion Joint effusion:  Trace left hip joint effusion. Bursae: Left iliopsoas bursitis, tracking cephalad beneath the left iliacus muscle. Mild left trochanteric bursitis, collection 5.7 by 4.4 by 8.2 cm. Muscles and tendons Muscles and tendons: Generalized muscular edema but most confluent in the gluteus medius muscles, and hip adductor musculature. The adductor musculature on the left is more edematous than on the right. Hamstring tendons intact. There is edema in the left vastus intermedius and lateralis muscles as on image 35 series 4. Low-grade edema and enhancement in the lower paraspinal musculature. Other findings Miscellaneous: Mild mesenteric and presacral edema in the pelvis. Diffuse subcutaneous edema. There is no edema or enhancement in the sciatic nerves. No impingement at the sciatic notch. A left kidney lower pole cyst is suspected. SI joints normal. IMPRESSION: 1. Diffuse third spacing of fluid with subcutaneous, muscular, mesenteric, and presacral edema, and faint diffuse accentuated muscular enhancement in the pelvis and upper thighs. This is most confluent in the hip adductor musculature (left greater than right), the gluteus medius muscles, and the left upper vastus lateralis and intermedius muscles. Strictly speaking I cannot exclude infectious myositis although the generalized appearance suggests a more systemic cause for third spacing of fluid. 2. There is left trochanteric and iliopsoas bursitis but no bursitis on the right. The iliopsoas bursitis track cephalad beneath the iliacus muscle. Upper normal amount of fluid in the left  hip joint. 3. Very low-level edema along the original intertrochanteric fracture and tracking along the stem of the implant which may  still be due to the recent implant placement and healing fracture. Electronically Signed   By: Van Clines M.D.   On: 03/01/2015 12:12   Ct Ankle Left Wo Contrast  02/27/2015  CLINICAL DATA:  Left foot pain with redness and exudate. Protruding fixation screws. Prior open reduction and internal fixation of fractures of the distal tibia and fibula. EXAM: CT OF THE LEFT ANKLE WITHOUT CONTRAST TECHNIQUE: Multidetector CT imaging of the left ankle was performed according to the standard protocol. Multiplanar CT image reconstructions were also generated. COMPARISON:  Radiographs dated 02/27/2015 FINDINGS: There are old healed fractures of distal tibia and fibula. 3 screws in the distal tibia are in good position. Side plate and multiple screws appear in good position in the distal tibia. No evidence of loosening of the screws. There is soft tissue edema anterior to the distal tibia and anterior lateral aspect of the proximal talus. No bone destruction to suggest osteomyelitis. No acute fractures. The tendons around the ankle appear normal. There is no ankle or subtalar joint effusion. The skin is quite thin over the second and third screws in the distal fibula counting from proximal to distal. These may be the exposed screw heads. IMPRESSION: No acute osseous abnormality. Slight soft tissue edema adjacent to the distal fibula and at the anterior lateral aspect the proximal talus just anterior to the fibula. No loosening of the hardware. The heads of the second and third screws in the distal tibia may be exposed due to break down of overlying skin. Electronically Signed   By: Lorriane Shire M.D.   On: 02/27/2015 15:34   Dg Ankle Left Port  02/27/2015  CLINICAL DATA:  Status post hardware removal with incision and drainage of left ankle. EXAM: PORTABLE LEFT ANKLE - 2 VIEW  COMPARISON:  02/27/2015, earlier same day. FINDINGS: Interval retrieval of lateral fibular plate evident. Cannulated compression screws remain in the distal tibia, as before. Lateral soft tissue swelling is evident. Bones are diffusely demineralized. Vacuum device overlies the lateral soft tissues. IMPRESSION: Status post retrieval of tibial plate Electronically Signed   By: Misty Stanley M.D.   On: 02/27/2015 20:33   Dg Hip Unilat With Pelvis 2-3 Views Left  02/27/2015  CLINICAL DATA:  Left hip surgery in November. Throbbing left hip pain medially. EXAM: DG HIP (WITH OR WITHOUT PELVIS) 2-3V LEFT COMPARISON:  12/25/2014 FINDINGS: Remote left intertrochanteric hip fracture transfixed with a intra medullary nail and interlocking cannulated femoral neck screw. Ununited displaced lesser trochanteric fracture fragment. Subtle lucency in the proximal left femur in the region of the lesser trochanter fracture which may be artifactual secondary to overlapping lesser trochanteric fracture fragment versus incomplete healing. No other fracture or dislocation. No hardware failure or complication. Mild osteoarthritis of the left hip. IMPRESSION: 1. Remote left intertrochanteric hip fracture transfixed with a intra medullary nail and interlocking cannulated femoral neck screw. Ununited displaced lesser trochanteric fracture fragment. Subtle lucency in the proximal left femur in the region of the lesser trochanter fracture which may be artifactual secondary to overlapping lesser trochanteric fracture fragment versus incomplete healing. Electronically Signed   By: Kathreen Devoid   On: 02/27/2015 10:51     CBC  Recent Labs Lab 02/27/15 6701 02/28/15 0027 03/01/15 0655 03/02/15 0710  WBC 9.8 9.5 8.1 8.0  HGB 9.8* 8.2* 7.3* 8.6*  HCT 29.8* 25.8* 22.4* 26.3*  PLT 534* 439* 456* 449*  MCV 87.1 87.8 88.9 87.1  MCH 28.7 27.9 29.0 28.5  MCHC 32.9 31.8  32.6 32.7  RDW 17.6* 17.2* 17.3* 17.7*  LYMPHSABS 0.7  --   --   --    MONOABS 0.6  --   --   --   EOSABS 0.0  --   --   --   BASOSABS 0.0  --   --   --     Chemistries   Recent Labs Lab 02/27/15 1038 02/28/15 0027 03/01/15 0655 03/02/15 0710  NA 141 140 138 138  K 3.5 3.3* 3.4* 4.2  CL 105 104 102 105  CO2 '22 26 27 27  ' GLUCOSE 124* 161* 102* 116*  BUN '14 11 8 ' 5*  CREATININE 0.88 0.77 0.73 0.69  CALCIUM 8.7* 8.6* 8.2* 8.3*  AST 85*  --  57* 98*  ALT 37  --  43 32  ALKPHOS 497*  --  715* 645*  BILITOT 0.6  --  0.6 0.6   ------------------------------------------------------------------------------------------------------------------ estimated creatinine clearance is 51.8 mL/min (by C-G formula based on Cr of 0.69). ------------------------------------------------------------------------------------------------------------------ No results for input(s): HGBA1C in the last 72 hours. ------------------------------------------------------------------------------------------------------------------ No results for input(s): CHOL, HDL, LDLCALC, TRIG, CHOLHDL, LDLDIRECT in the last 72 hours. ------------------------------------------------------------------------------------------------------------------  Recent Labs  02/28/15 1138  TSH 0.959   ------------------------------------------------------------------------------------------------------------------  Recent Labs  02/28/15 1138  VITAMINB12 633  FOLATE 14.4  FERRITIN 568*  TIBC 116*  IRON 8*  RETICCTPCT <0.4*    Coagulation profile No results for input(s): INR, PROTIME in the last 168 hours.  No results for input(s): DDIMER in the last 72 hours.  Cardiac Enzymes No results for input(s): CKMB, TROPONINI, MYOGLOBIN in the last 168 hours.  Invalid input(s): CK ------------------------------------------------------------------------------------------------------------------ Invalid input(s): POCBNP   CBG: No results for input(s): GLUCAP in the last 168  hours.     Studies: Mr Hip Left W Wo Contrast  03/01/2015  CLINICAL DATA:  Multiple falls in the last several months causing left ankle fracture and left hip fracture. 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 EXAM: MRI OF THE LEFT HIP WITHOUT AND WITH CONTRAST TECHNIQUE: Multiplanar, multisequence MR imaging was performed both before and after administration of intravenous contrast. CONTRAST:  44m MULTIHANCE GADOBENATE DIMEGLUMINE 529 MG/ML IV SOLN COMPARISON:  02/27/2015 FINDINGS: Field heterogeneity due to the hip implant. Bones: Indistinct original intertrochanteric fracture traversed by the IM nail and screw. There is some low-level edema in the left hip intertrochanteric region and tracking along the stem of the implant. No new fracture identified. Articular cartilage and labrum Articular cartilage: Moderate degenerative chondral thinning in the left hip joint. Labrum:  Indeterminate due to motion artifact and metal artifact. Joint or bursal effusion Joint effusion:  Trace left hip joint effusion. Bursae: Left iliopsoas bursitis, tracking cephalad beneath the left iliacus muscle. Mild left trochanteric bursitis, collection 5.7 by 4.4 by 8.2 cm. Muscles and tendons Muscles and tendons: Generalized muscular edema but most confluent in the gluteus medius muscles, and hip adductor musculature. The adductor musculature on the left is more edematous than on the right. Hamstring tendons intact. There is edema in the left vastus intermedius and lateralis muscles as on image 35 series 4. Low-grade edema and enhancement in the lower paraspinal musculature. Other findings Miscellaneous: Mild mesenteric and presacral edema in the pelvis. Diffuse subcutaneous edema. There is no edema or enhancement in the sciatic nerves. No impingement at the sciatic notch. A left kidney lower pole cyst is suspected. SI joints normal. IMPRESSION: 1. Diffuse third spacing of fluid with subcutaneous, muscular, mesenteric, and  presacral edema, and faint diffuse  accentuated muscular enhancement in the pelvis and upper thighs. This is most confluent in the hip adductor musculature (left greater than right), the gluteus medius muscles, and the left upper vastus lateralis and intermedius muscles. Strictly speaking I cannot exclude infectious myositis although the generalized appearance suggests a more systemic cause for third spacing of fluid. 2. There is left trochanteric and iliopsoas bursitis but no bursitis on the right. The iliopsoas bursitis track cephalad beneath the iliacus muscle. Upper normal amount of fluid in the left hip joint. 3. Very low-level edema along the original intertrochanteric fracture and tracking along the stem of the implant which may still be due to the recent implant placement and healing fracture. Electronically Signed   By: Van Clines M.D.   On: 03/01/2015 12:12      Lab Results  Component Value Date   HGBA1C 5.3 08/07/2014   Lab Results  Component Value Date   LDLCALC 69 08/07/2014   CREATININE 0.69 03/02/2015       Scheduled Meds: . sodium chloride   Intravenous Once  . aspirin EC  325 mg Oral Daily  . calcium carbonate  1,250 mg Oral Q breakfast  .  ceFAZolin (ANCEF) IV  2 g Intravenous 3 times per day  . enoxaparin (LOVENOX) injection  40 mg Subcutaneous Q24H  . metoprolol tartrate  25 mg Oral BID  . pantoprazole  40 mg Oral QAC breakfast  . sertraline  100 mg Oral Daily  . sodium chloride  250 mL Intravenous Once  . sodium chloride flush  3 mL Intravenous Q12H  . vancomycin  500 mg Intravenous Q12H   Continuous Infusions: . 0.9 % NaCl with KCl 40 mEq / L 75 mL/hr (03/01/15 1809)    Active Problems:   Wound dehiscence   Tachycardia   Deep incisional surgical site infection   Wound cellulitis   Hip pain, acute   Postop check   Acute blood loss anemia    Time spent: 45 minutes   [facility] Pager [phone]. If 7PM-7AM, please contact  night-coverage at [email or web address], password [facility] 03/02/2015, 10:42 AM  LOS: 3 days

## 2015-03-02 NOTE — Care Management Important Message (Signed)
Important Message  Patient Details  Name: [name] MRN: [id number] Date of Birth: 1945/07/24   Medicare Important Message Given:  Yes    Oralia Rud Hargun Spurling 03/02/2015, 2:55 PM

## 2015-03-02 NOTE — Progress Notes (Signed)
Physical Therapy Treatment Patient Details Name: [name] MRN: [id number] DOB: 1945/11/18 Today's Date: 03/02/2015    History of Present Illness This 71 y.o. female admitted 02/27/15 with increased pain Lt hip and ankle as well as exposed hardware Lt ankle. She underwent removal of ankle hardware and application of wound VAC lt ankle 02/27/15. PMH includes Lt ankle fx 10/16 due to fall.Fall at Thanksgiving resulting in Lt hip fracture, and underwent ORIF. Opoid abuse, OCD, sepsis, anxiety, dizziness, depressive disorder, H/O MRSA     PT Comments    Pt with improved mobility with transfers today able to stand and pivot without assist. Continues to have difficulty with bed mobility and demanded use of bedpan rather than attempting to use BSC during session. Pt educated for mobility and HEP. Pt demonstrates self-limiting behavior and provided encouragement throughout. Pt denied attempting gait or further activity. Will continue to follow.   Follow Up Recommendations  SNF;Supervision/Assistance - 24 hour     Equipment Recommendations       Recommendations for Other Services       Precautions / Restrictions Precautions Precautions: Fall Other Brace/Splint: CAM walker  Restrictions LLE Weight Bearing: Weight bearing as tolerated    Mobility  Bed Mobility Overal bed mobility: Needs Assistance Bed Mobility: Supine to Sit     Supine to sit: Mod assist     General bed mobility comments: max verbal cues with pt only partially willing to follow commands for use of LUE to push into bed stating IV hurts. Assist to scoot pelvis to EOB and elevate trunk  Transfers     Transfers: Sit to/from [employer] Sit to Stand: Min guard Stand pivot transfers: Min guard       General transfer comment: pt impulsive with transfer from bed today and stood on her own with use of RW and pivoted to chair. Pt denied increased mobility or attempt at gait, cues for hand  placement  Ambulation/Gait       Gait Pattern/deviations: Trendelenburg         Stairs            Wheelchair Mobility    Modified Rankin (Stroke Patients Only)       Balance                                    Cognition Arousal/Alertness: Awake/alert Behavior During Therapy: WFL for tasks assessed/performed Overall Cognitive Status: Within Functional Limits for tasks assessed                      Exercises General Exercises - Lower Extremity Long Arc Quad: AROM;Seated;Both;15 reps Hip Flexion/Marching: AROM;Seated;Right;15 reps    General Comments        Pertinent Vitals/Pain Pain Score: 9  Pain Location: left hip and ankle despite premedication Pain Intervention(s): Limited activity within patient's tolerance;Monitored during session;Premedicated before session;Repositioned    Home Living                      Prior Function            PT Goals (current goals can now be found in the care plan section) Progress towards PT goals: Progressing toward goals    Frequency  Min 3X/week    PT Plan Frequency needs to be updated;Current plan remains appropriate    Co-evaluation  End of Session Equipment Utilized During Treatment: Gait belt Activity Tolerance: Patient limited by pain Patient left: in chair;with call bell/phone within reach;with chair alarm set     Time: 0981-1914 PT Time Calculation (min) (ACUTE ONLY): 15 min  Charges:  $Therapeutic Activity: 8-22 mins                    G Codes:      Delorse Lek 03-04-2015, 10:42 AM Delaney Meigs, PT [phone]

## 2015-03-02 NOTE — Clinical Social Work Note (Signed)
CSW met with patient who is refusing SNF at this time.  Patient states she lives with son and will return home at time of discharge.  CSW received permission to speak to son, Lanny Hurst and daughter, [name].  Patient was working with therapy.  Number for daughter [name] was noted in the chart.  CSW contacted daughter who states she would be the patient's main caregiver at time of discharge and that patient would be coming to stay with her until the patient's PICC line is out. Daughter agreed to received education regarding patient's IV abx.  Daughter states her address is [place] [address].  RNCM consulted.    Nonnie Done, LCSW [phone]  5N1-9; 2S 15-16 and [place] Licensed Clinical Social Worker

## 2015-03-02 NOTE — Progress Notes (Signed)
   Subjective:  Patient reports pain as mild to moderate.  MRI hip completed.  Objective:   VITALS:   Filed Vitals:   03/01/15 1500 03/01/15 1800 03/01/15 2019 03/02/15 0552  BP: 150/51 160/53 126/43 158/63  Pulse: 102 115 107 106  Temp: 100.3 F (37.9 C) 100.5 F (38.1 C) 99.7 F (37.6 C) 99.1 F (37.3 C)  TempSrc: Oral Axillary Oral Oral  Resp: Height:      Weight:      SpO2: 100% 100% 95% 96%    LLE: dressing intact. VAC intact. VAC taken down. Wound looks good. 2+ DP. Subjective sensory change - no different. +TA/GS/EHL.  Lab Results  Component Value Date   WBC 8.1 03/01/2015   HGB 7.3* 03/01/2015   HCT 22.4* 03/01/2015   MCV 88.9 03/01/2015   PLT 456* 03/01/2015   BMET    Component Value Date/Time   NA 138 03/01/2015 0655   NA 137 12/12/2014   K 3.4* 03/01/2015 0655   CL 102 03/01/2015 0655   CO2 27 03/01/2015 0655   GLUCOSE 102* 03/01/2015 0655   BUN 8 03/01/2015 0655   BUN 9 12/12/2014   CREATININE 0.73 03/01/2015 0655   CREATININE 1.0 12/12/2014   CALCIUM 8.2* 03/01/2015 0655   GFRNONAA >60 03/01/2015 0655   GFRAA >60 03/01/2015 0655     Assessment/Plan: 3 Days Post-Op   Active Problems:   Wound dehiscence   Tachycardia   Deep incisional surgical site infection   Wound cellulitis   Hip pain, acute   Postop check   Acute blood loss anemia   WBAT in boot WC team to replace incisional VAC today - mepitel, black sponge IV abx per ID Following   Henli Hey, Cloyde Reams 03/02/2015, 7:59 AM   Samson Frederic, MD Cell [phone]

## 2015-03-02 NOTE — Consult Note (Addendum)
WOC wound consult note Reason for Consult: replacement of NPWT incisional therapy to left ankle wound Wound type: surgical  Measurement: 6cm incision with sutures, at the time of surgery skin had necrosed around hardware, so these areas are left open between the sutures that are helping to approximate the wound.  Wound bed: clean Drainage (amount, consistency, odor) minimal on dressing at the time of my assessment, serosanguinous  Periwound: intact, small MARSI (medical adhesive related skin injury) just lateral of the dressing, blistering Dressing procedure/placement/frequency: Mepitel in place at the time of my arrival over closed incision.  1pc of black foam used to cover entire incision, placed over Mepitel. Seal at .  Patient tolerated well.  ABD pad use to protect pretibial area from VAC tubing. ACE wrap applied from toes to knee and CAM walking boot replaced.  WOC contacted Dr. Linna Caprice to verify frequency, will update orders appropriately  WOC team will follow along for incisional VAC therapy.  Quantavia Frith [place], [place] [phone]

## 2015-03-03 DIAGNOSIS — D62 Acute posthemorrhagic anemia: Secondary | ICD-10-CM

## 2015-03-03 LAB — COMPREHENSIVE METABOLIC PANEL
ALBUMIN: 1.8 g/dL — AB (ref 3.5–5.0)
ALT: 16 U/L (ref 14–54)
AST: 48 U/L — AB (ref 15–41)
Alkaline Phosphatase: 610 U/L — ABNORMAL HIGH (ref 38–126)
Anion gap: 8 (ref 5–15)
CHLORIDE: 103 mmol/L (ref 101–111)
CO2: 26 mmol/L (ref 22–32)
Calcium: 8.5 mg/dL — ABNORMAL LOW (ref 8.9–10.3)
Creatinine, Ser: 0.72 mg/dL (ref 0.44–1.00)
GFR calc Af Amer: >60 mL/min (ref >60)
GFR calc non Af Amer: >60 mL/min (ref >60)
GLUCOSE: 146 mg/dL — AB (ref 65–99)
POTASSIUM: 4.1 mmol/L (ref 3.5–5.1)
SODIUM: 137 mmol/L (ref 135–145)
Total Bilirubin: 0.4 mg/dL (ref 0.3–1.2)
Total Protein: 5.5 g/dL — ABNORMAL LOW (ref 6.5–8.1)

## 2015-03-03 LAB — CBC
HCT: 27.5 % — ABNORMAL LOW (ref 36.0–46.0)
Hemoglobin: 8.7 g/dL — ABNORMAL LOW (ref 12.0–15.0)
MCH: 27.7 pg (ref 26.0–34.0)
MCHC: 31.6 g/dL (ref 30.0–36.0)
MCV: 87.6 fL (ref 78.0–100.0)
PLATELETS: 515 10*3/uL — AB (ref 150–400)
RBC: 3.14 MIL/uL — AB (ref 3.87–5.11)
RDW: 17.1 % — AB (ref 11.5–15.5)
WBC: 7.2 10*3/uL (ref 4.0–10.5)

## 2015-03-03 LAB — VITAMIN D 25 HYDROXY (VIT D DEFICIENCY, FRACTURES): Vit D, 25-Hydroxy: 31.1 ng/mL (ref 30.0–100.0)

## 2015-03-03 MED ORDER — SODIUM CHLORIDE 0.9% FLUSH: 10.0000 mL | INTRAVENOUS | Status: DC | PRN

## 2015-03-03 MED ORDER — OXYCODONE HCL 5 MG PO TABS
10.0000 mg | ORAL_TABLET | ORAL | Status: DC | PRN
  Administered 2015-03-03 (×2): 15 mg via ORAL
  Administered 2015-03-03 – 2015-03-04 (×3): 10 mg via ORAL
  Administered 2015-03-04: 15 mg via ORAL
  Administered 2015-03-04: 10 mg via ORAL
  Filled 2015-03-03: qty 2
  Filled 2015-03-03 (×2): qty 3
  Filled 2015-03-03 (×4): qty 2
  Filled 2015-03-03: qty 3

## 2015-03-03 MED ORDER — ENSURE ENLIVE PO LIQD
237.0000 mL | Freq: Two times a day (BID) | ORAL | Status: DC
  Administered 2015-03-03 – 2015-03-04 (×3): 237 mL via ORAL

## 2015-03-03 MED ORDER — OXYCODONE HCL 5 MG PO TABS: 5.0000 mg | ORAL_TABLET | ORAL | Status: DC | PRN

## 2015-03-03 NOTE — Discharge Instructions (Signed)
Weight bearing as tolerated in CAM boot. Wear boot at all times. Remove boot once daily for skin check.

## 2015-03-03 NOTE — Progress Notes (Signed)
[facility]  Patient Status: New pt for [facility] this admission  [facility] is providing the following services: H[facility]N and [facility] for home IV ABX.  If patient discharges after hours, please call [phone].   Sedalia Muta 03/03/2015, 8:37 AM

## 2015-03-03 NOTE — Progress Notes (Signed)
Initial Nutrition Assessment  DOCUMENTATION CODES:   Not applicable  INTERVENTION:  -Provide Ensure Enlive BID, 350 kcal, 20 g of Protein per bottle -Encourage PO intake  NUTRITION DIAGNOSIS:   Increased nutrient needs related to wound healing as evidenced by estimated needs.  GOAL:   Patient will meet greater than or equal to 90% of their needs  MONITOR:   PO intake, Supplement acceptance, Labs, Weight trends, Skin, I & O's  REASON FOR ASSESSMENT:   Malnutrition Screening Tool   ASSESSMENT:   70 year old female who had a fall with left ankle fracture in October 2016 which was treated with ORIF of the left ankle in [place]. 6 weeks later, she had a second fall which resulted in a left hip fracture which was treated in [place] IM nail L IT fx by Dr. Victorino Dike 12/25/2014 with chronic trochanteric bursitis She reported she been wearing an ankle brace and was continuously over the last few months and a family member noticed today when she was taking a shower that she had exposed hardware with erythema and periodic discharge from the left ankle. She presented to the emergency department where she had obvious exposed hardware  Pt seen for MST. Pt reports a good appetite, she consumes 50%-100% of her meal trays. Pt reports no weight loss. Per chart pt has a 6% weight loss over 4 months, which is not significant for time frame. Pt is agreeable to nutritional supplements for wound healing. Will order ensure BID.  Conducted nutrition focused physical exam, no signs of muscle or fat wasting.  Reviewed medications. Reviewed labs.  Diet Order:  Diet Heart Room service appropriate?: Yes; Fluid consistency:: Thin  Skin:  Wound (see comment) (Incision left ankle)  Last BM:  1/29  Height:   Ht Readings from Last 1 Encounters:  02/27/15  (1.575 m)    Weight:   Wt Readings from Last 1 Encounters:  02/27/15 117 lb (53.071 kg)    Ideal Body Weight:  50 kg (kg)  BMI:  Body  mass index is 21.39 kg/(m^2).  Estimated Nutritional Needs:   Kcal:  1600-1800  Protein:  80-90 g  Fluid:  1.6-1.8 L/d  EDUCATION NEEDS:   No education needs identified at this time  [place], Dietetic Intern Pager: [phone]

## 2015-03-03 NOTE — Progress Notes (Signed)
Peripherally Inserted Central Catheter/Midline Placement  The IV Nurse has discussed with the patient and/or persons authorized to consent for the patient, the purpose of this procedure and the potential benefits and risks involved with this procedure.  The benefits include less needle sticks, lab draws from the catheter and patient may be discharged home with the catheter.  Risks include, but not limited to, infection, bleeding, blood clot (thrombus formation), and puncture of an artery; nerve damage and irregular heat beat.  Alternatives to this procedure were also discussed.  PICC/Midline Placement Documentation        Lisabeth Devoid 03/03/2015, 2:01 PM Consent obtained by Lazarus Gowda, RN

## 2015-03-03 NOTE — Progress Notes (Signed)
Occupational Therapy Treatment Patient Details Name: [name] MRN: [id number] DOB: Apr 18, 1945 Today's Date: 03/03/2015    History of present illness This 70 y.o. female admitted 02/27/15 with increased pain Lt hip and ankle as well as exposed hardware Lt ankle. She underwent removal of ankle hardware and application of wound VAC lt ankle 02/27/15. PMH includes Lt ankle fx 10/16 due to fall.Fall at Thanksgiving resulting in Lt hip fracture, and underwent ORIF. Opoid abuse, OCD, sepsis, anxiety, dizziness, depressive disorder, H/O MRSA    OT comments  Pt making slow but good progress. Pt agreeable to ambulate into bathroom to use BSC today - pt made it to sink where BSC was brought next to her which is a significant improvement from other therapy sessions. Pt completed transfer with min assist and max verbal cues to bring R foot to meet L foot before taking additional steps. Max assist provided for dressing and pericare tasks.  Pt's daughter present for session and realized she is unable to provide the necessary level of care that the pt needs. Pt and daughter discussed that SNF placement would be best and safest option at this time and OT agreed. Pt and daughter would prefer [facility] for rehab stay because it is close to pt's house.    Follow Up Recommendations  SNF;Supervision/Assistance - 24 hour    Equipment Recommendations  Other (comment) (TBD in next venue)    Recommendations for Other Services      Precautions / Restrictions Precautions Precautions: Fall Precaution Comments: two falls at home 10/16 (ankle fracture - fell in yard), 11/16 fell at daughter's (hip fracture).  Denies other falls  Required Braces or Orthoses: Other Brace/Splint Other Brace/Splint: CAM walker  Restrictions Weight Bearing Restrictions: Yes LLE Weight Bearing: Weight bearing as tolerated       Mobility Bed Mobility Overal bed mobility: Needs Assistance Bed Mobility: Supine to Sit      Supine to sit: Mod assist     General bed mobility comments: HOB flat, use of bedrails. Mod assist to progress LLE off bed and to pull up trunk into sitting position.  Transfers Overall transfer level: Needs assistance Equipment used: Rolling walker (2 wheeled) Transfers: Sit to/from Stand Sit to Stand: Min assist         General transfer comment: Min assist for balance. Verbal cues to pick head and chest up to encouraged hip extension. Pt with good demonstration of safe hand placement. Very anxious and impulsive at times with movement.    Balance Overall balance assessment: Needs assistance Sitting-balance support: No upper extremity supported;Feet supported Sitting balance-Leahy Scale: Fair Sitting balance - Comments: Required UE support and assist to maintain sitting balance.   Standing balance support: Bilateral upper extremity supported;During functional activity Standing balance-Leahy Scale: Poor Standing balance comment: Anterior lean, flexed trunk, heavy reliance on UE support                   ADL Overall ADL's : Needs assistance/impaired                         Toilet Transfer: Minimal assistance;Cueing for safety;Cueing for sequencing;Ambulation;BSC;RW   Toileting- Clothing Manipulation and Hygiene: Maximal assistance;Cueing for safety;Sit to/from stand       Functional mobility during ADLs: Minimal assistance;Rolling walker;Cueing for sequencing;Cueing for safety General ADL Comments: Daughter present for OT session. Pt performed better than previous sessions and ambulated from bed to sink where BSC was placed. Max assist for  dressing and pericare continues. Daughter tearful and stated to pt that she could not provide this level of care at her home and pt agreed SNF was better option at this time.      Vision                     Perception     Praxis      Cognition   Behavior During Therapy: Anxious;Flat affect Overall  Cognitive Status: Within Functional Limits for tasks assessed                       Extremity/Trunk Assessment               Exercises     Shoulder Instructions       General Comments      Pertinent Vitals/ Pain       Pain Assessment: 0-10 Pain Score: 7  Pain Location: L hip and ankle Pain Descriptors / Indicators: Aching;Stabbing Pain Intervention(s): Limited activity within patient's tolerance;Monitored during session;Repositioned;RN gave pain meds during session  Home Living                                          Prior Functioning/Environment              Frequency       Progress Toward Goals  OT Goals(current goals can now be found in the care plan section)  Progress towards OT goals: Progressing toward goals  Acute Rehab OT Goals Patient Stated Goal: to go home OT Goal Formulation: With patient Time For Goal Achievement: 03/14/15 Potential to Achieve Goals: Good ADL Goals Pt Will Perform Grooming: with min assist;standing Pt Will Perform Lower Body Bathing: with min assist;with adaptive equipment;sit to/from stand Pt Will Perform Upper Body Dressing: with set-up;sitting Pt Will Perform Lower Body Dressing: with min assist;sit to/from stand;with adaptive equipment Pt Will Transfer to Toilet: with min assist;ambulating;regular height toilet;bedside commode;grab bars Pt Will Perform Toileting - Clothing Manipulation and hygiene: with min assist;sit to/from stand  Plan Discharge plan remains appropriate    Co-evaluation                 End of Session Equipment Utilized During Treatment: Gait belt;Rolling walker;Other (comment) (CAM walker)   Activity Tolerance Patient tolerated treatment well   Patient Left in chair;with call bell/phone within reach;with family/visitor present   Nurse Communication Mobility status;Other (comment) (change of d/c plan, pt to sit up in chair for 1 hour)        Time: 1627-1700 OT  Time Calculation (min): 33 min  Charges: OT General Charges $OT Visit: 1 Procedure OT Treatments $Self Care/Home Management : 23-37 mins  Nils Pyle, OTR/L Pager: [phone] 03/03/2015, 6:14 PM

## 2015-03-03 NOTE — Progress Notes (Addendum)
   Subjective:  Patient reports pain as mild to moderate.    Objective:   VITALS:   Filed Vitals:   03/02/15 1347 03/02/15 2036 03/02/15 2315 03/03/15 0500  BP: 148/58 159/63  164/66  Pulse: 111 111 104 119  Temp: 100.1 F (37.8 C) 98.8 F (37.1 C)  98 F (36.7 C)  TempSrc: Oral Oral  Oral  Resp: Height:      Weight:      SpO2: 92% 90%  92%    LLE: dressing intact. VAC intact. VAC taken down. Wound looks good. 2+ DP. Subjective sensory change - no different. +TA/GS/EHL.  Lab Results  Component Value Date   WBC 8.0 03/02/2015   HGB 8.6* 03/02/2015   HCT 26.3* 03/02/2015   MCV 87.1 03/02/2015   PLT 449* 03/02/2015   BMET    Component Value Date/Time   NA 138 03/02/2015 0710   NA 137 12/12/2014   K 4.2 03/02/2015 0710   CL 105 03/02/2015 0710   CO2 27 03/02/2015 0710   GLUCOSE 116* 03/02/2015 0710   BUN 5* 03/02/2015 0710   BUN 9 12/12/2014   CREATININE 0.69 03/02/2015 0710   CREATININE 1.0 12/12/2014   CALCIUM 8.3* 03/02/2015 0710   GFRNONAA >60 03/02/2015 0710   GFRAA >60 03/02/2015 0710     Assessment/Plan: 4 Days Post-Op   Active Problems:   Wound dehiscence   Tachycardia   Deep incisional surgical site infection   Wound cellulitis   Hip pain, acute   Postop check   Acute blood loss anemia   WBAT in boot Continue wound VAC until follow up - high risk wound IV abx per ID Dispo: needs PICC and IV abx per ID   Ryota Treece, Cloyde Reams 03/03/2015, 7:48 AM   Samson Frederic, MD Cell [phone]

## 2015-03-03 NOTE — Care Management Note (Signed)
Case Management Note  Patient Details  Name: [name] MRN: [id number] Date of Birth: 1945-02-05  Subjective/Objective:    70 yr old female admitted with infected left ankle, s/p I & D        Action/Plan:     Case manager spoke with patient concerning home health needs at discharge. Case manager also contacted patient's daughter [name] to confirm home health. Choice was offered. Referral was called to [facility], [facility]. Patient will go home with IV antibiotics and wound vac., she will be going to her daughter's home for recovery:  [address] [place], [place] [address]   [phone].  Case manager will continue to monitor.   Expected Discharge Date:    1/ 31/17            Expected Discharge Plan:   home with home health  In-House Referral:     Discharge planning Services     Post Acute Care Choice:    Choice offered to:     DME Arranged:   vac / DME Agency:     HH Arranged:   RN/PT HH Agency:   [facility]  Status of Service:   In process  Medicare Important Message Given:  Yes Date Medicare IM Given:    Medicare IM give by:    Date Additional Medicare IM Given:    Additional Medicare Important Message give by:     If discussed at Long Length of Stay Meetings, dates discussed:    Additional Comments:  [name], RN 03/03/2015, 8:49 AM

## 2015-03-03 NOTE — Progress Notes (Addendum)
[facility] PROGRESS NOTE  [name] ULA:[id number] DOB: 1945/05/03 DOA: 02/27/2015 PCP: [facility]  Length of stay: 4   Assessment/Plan: Active Problems:   Wound dehiscence   Tachycardia   Deep incisional surgical site infection   Wound cellulitis   Hip pain, acute   Postop check   Acute blood loss anemia    Brief summary 70 year old female who had a fall with left ankle fracture in October 2016 which was treated with ORIF of the left ankle in [place]. 6 weeks later, she had a second fall which resulted in a left hip fracture which was treated in [place]  IM nail L IT fx by Dr. Doran Durand 12/25/2014 with chronic trochanteric bursitis She reported she been wearing an ankle brace and was continuously over the last few months and a family member noticed today when she was taking a shower that she had exposed hardware with erythema and periodic discharge from the left ankle. She presented to the emergency department where she had obvious exposed hardware. She was afebrile without leukocytosis however her CRP and ESR very elevated. On exam, she was in no acute distress, heart rate tachycardic but regular,  Her left lower extremity had trace pitting edema she had to exposed screws along the left lateral aspect of her left ankle both of which had a small amount of purulence and boggy mildly erythematous tissue surrounding.CT of the ankle without contrast showed soft tissue edema, exposed second and third screws in the distal tibia.  Patient was taken to the OR on 1/27 by Rod Can, M.D for irrigation and debridement of the left ankle and hardware removal. Currently has a wound VAC in place    Assessment and plan Infected left ankle wound with retained hardware, now with GPC  bacteremia Blood culture positive for gram-positive cocci in pairs-Continue with cefazolin through March 9th Currently on  cefazolin, infectious disease following,  recommending 3-4 weeks  Repeat blood cultures obtained 1/29, no growth so far, place order for PICC line Transthoracic echo pending Uncontrolled pain therefore increased dose of oxycodone follow up with ID  in 3-4 weeks  Left hip pain Given bacteremia patient underwent MRI of the left hip to r/o septic arthritis,   Left hip MRI complete - reviewed by orthopedics, looks as expected for healing intertroch hip fracture No fluid collection or hip effusion to suggest septic hip Hip bursitis or arthritis likely for daignosis, plan as per orthopedics  Hypokalemia Replete potassium  Sinus tachycardia. Evaluated by Cardiology in November 11/15. Likely in the setting of low-grade fever, sepsis  Continue metoprolol at the current dose, in the setting of possible sepsis, patient was supposed to be taking 25 mg BID  at home  per previous cardiology notes Continue on gentle IV hydration , continues to have low-grade fever Continue metoprolol to 25 mg by mouth twice a day   Elevated lactic acid. Suspect from dehydration. Low suspicion for sepsis.  Anemia of chronic disease-baseline around 8.5-9, likely dehydrated yesterday with a hemoglobin of 9.8, hemoglobin dropped to 7.3 , status post transfusion of 1 unit, hemoglobin now 8.7 Anemia panel consistent with anemia of chronic disease, TSH 0.9    Depression. -continue home Zoloft  History of grade I diastolic dysfunction on echo July 2016. No evidence for acute decompensation at present.   Elevated alk phos. Likely originating from the bone.  Normal in November when here for fracture. Etiology not clear.   She  has a mildly elevated AST as well. Remainder of LFTs normal.  Vitamin D level 31th   DVT prophylaxsis Lovenox  Code Status:      Code Status Orders        Start     Ordered   02/27/15 2134  Full code   Continuous     02/27/15 2133     Family Communication: Discussed in detail with the patient, all imaging results, lab results  explained to the patient   Disposition Plan:  per orthopedics, , need 3-4 weeks of IV antibiotics, PICC line in the next 1-2 days     Consultants:  Orthopedics  Infectious disease  Procedures: Infected left ankle wound with retained hardware.  PROCEDURE PERFORMED: 1. Debridement of left ankle wound. 2. Removal of deep implant, left ankle.  Antibiotics: Anti-infectives    Start     Dose/Rate Route Frequency Ordered Stop   02/28/15 1400  ceFAZolin (ANCEF) IVPB 2 g/50 mL premix     2 g 100 mL/hr over 30 Minutes Intravenous 3 times per day 02/28/15 1237     02/28/15 0600  vancomycin (VANCOCIN) 500 mg in sodium chloride 0.9 % 100 mL IVPB  Status:  Discontinued     500 mg 100 mL/hr over 60 Minutes Intravenous Every 12 hours 02/27/15 2115 03/02/15 1108   02/27/15 2200  piperacillin-tazobactam (ZOSYN) IVPB 3.375 g  Status:  Discontinued     3.375 g 12.5 mL/hr over 240 Minutes Intravenous Every 8 hours 02/27/15 2114 02/28/15 1237   02/27/15 1830  vancomycin (VANCOCIN) IVPB 1000 mg/200 mL premix  Status:  Discontinued     1,000 mg 200 mL/hr over 60 Minutes Intravenous  Once 02/27/15 1824 02/27/15 1839   02/27/15 1700  vancomycin (VANCOCIN) IVPB 1000 mg/200 mL premix     1,000 mg 200 mL/hr over 60 Minutes Intravenous To Surgery 02/27/15 1650 02/27/15 1915         HPI/Subjective: Complaining of ankle pain, continues to have low-grade fever overnight,     Objective: Filed Vitals:   03/02/15 1347 03/02/15 2036 03/02/15 2315 03/03/15 0500  BP: 148/58 159/63  164/66  Pulse: 111 111 104 119  Temp: 100.1 F (37.8 C) 98.8 F (37.1 C)  98 F (36.7 C)  TempSrc: Oral Oral  Oral  Resp: '18 18  18  ' Height:      Weight:      SpO2: 92% 90%  92%    Intake/Output Summary (Last 24 hours) at 03/03/15 1313 Last data filed at 03/03/15 0900  Gross per 24 hour  Intake   1310 ml  Output      0 ml  Net   1310 ml    Exam:  General: No acute respiratory distress Lungs: Clear to  auscultation bilaterally without wheezes or crackles Cardiovascular: Regular rate and rhythm without murmur gallop or rub normal S1 and S2 Abdomen: Nontender, nondistended, soft, bowel sounds positive, no rebound, no ascites, no appreciable mass MUSCULOSKELETAL: L ankle with 3 exposed screw heads over lateral incision; (+) periwound erythema and seropurulent drainage. Medial and anterior incisions healed without evidence of infection. Ankle very stiff. 2+ DP pulse    Data Review   Micro Results Recent Results (from the past 240 hour(s))  Culture, blood (Routine X 2) w Reflex to ID Panel     Status: None   Collection Time: 02/27/15 10:45 AM  Result Value Ref Range Status   Specimen Description BLOOD RIGHT FOREARM  Final   Special Requests BOTTLES  DRAWN AEROBIC AND ANAEROBIC 5CCS  Final   Culture  Setup Time   Final    GRAM POSITIVE COCCI IN CLUSTERS IN BOTH AEROBIC AND ANAEROBIC BOTTLES CRITICAL RESULT CALLED TO, READ BACK BY AND VERIFIED WITH: S WHITEHORN,RN'@0330'  02/28/15 MKELLY    Culture STAPHYLOCOCCUS AUREUS  Final   Report Status 03/02/2015 FINAL  Final   Organism ID, Bacteria STAPHYLOCOCCUS AUREUS  Final      Susceptibility   Staphylococcus aureus - MIC*    CIPROFLOXACIN <=0.5 SENSITIVE Sensitive     ERYTHROMYCIN <=0.25 SENSITIVE Sensitive     GENTAMICIN <=0.5 SENSITIVE Sensitive     OXACILLIN 0.5 SENSITIVE Sensitive     TETRACYCLINE <=1 SENSITIVE Sensitive     VANCOMYCIN 1 SENSITIVE Sensitive     TRIMETH/SULFA <=10 SENSITIVE Sensitive     CLINDAMYCIN <=0.25 SENSITIVE Sensitive     RIFAMPIN <=0.5 SENSITIVE Sensitive     Inducible Clindamycin NEGATIVE Sensitive     * STAPHYLOCOCCUS AUREUS  Culture, blood (Routine X 2) w Reflex to ID Panel     Status: None   Collection Time: 02/27/15 10:50 AM  Result Value Ref Range Status   Specimen Description BLOOD RIGHT HAND  Final   Special Requests BOTTLES DRAWN AEROBIC AND ANAEROBIC 5CCS  Final   Culture  Setup Time   Final     GRAM POSITIVE COCCI IN CLUSTERS IN PAIRS IN BOTH AEROBIC AND ANAEROBIC BOTTLES CRITICAL RESULT CALLED TO, READ BACK BY AND VERIFIED WITH: S WHITEHORN'@0156'  02/28/15 MKELLY    Culture   Final    STAPHYLOCOCCUS AUREUS SUSCEPTIBILITIES PERFORMED ON PREVIOUS CULTURE WITHIN THE LAST 5 DAYS.    Report Status 03/02/2015 FINAL  Final  Anaerobic culture     Status: None (Preliminary result)   Collection Time: 02/27/15  7:11 PM  Result Value Ref Range Status   Specimen Description ABSCESS LEFT ANKLE  Final   Special Requests PATIENT ON FOLLOWING VANC  Final   Gram Stain   Final    RARE WBC PRESENT, PREDOMINANTLY PMN NO SQUAMOUS EPITHELIAL CELLS SEEN NO ORGANISMS SEEN Performed at [facility]    Culture   Final    NO ANAEROBES ISOLATED; CULTURE IN PROGRESS FOR 5 DAYS Performed at [facility]    Report Status PENDING  Incomplete  Culture, routine-abscess     Status: None   Collection Time: 02/27/15  7:11 PM  Result Value Ref Range Status   Specimen Description ABSCESS LEFT ANKLE  Final   Special Requests PATIENT ON FOLLOWING VANC  Final   Gram Stain   Final    FEW WBC PRESENT,BOTH PMN AND MONONUCLEAR FEW SQUAMOUS EPITHELIAL CELLS PRESENT RARE GRAM POSITIVE COCCI IN PAIRS Performed at [facility]    Culture   Final    FEW STAPHYLOCOCCUS AUREUS Note: RIFAMPIN AND GENTAMICIN SHOULD NOT BE USED AS SINGLE DRUGS FOR TREATMENT OF STAPH INFECTIONS. Performed at [facility]    Report Status 03/02/2015 FINAL  Final   Organism ID, Bacteria STAPHYLOCOCCUS AUREUS  Final      Susceptibility   Staphylococcus aureus - MIC*    CLINDAMYCIN <=0.25 SENSITIVE Sensitive     ERYTHROMYCIN <=0.25 SENSITIVE Sensitive     GENTAMICIN <=0.5 SENSITIVE Sensitive     LEVOFLOXACIN 0.25 SENSITIVE Sensitive     OXACILLIN 0.5 SENSITIVE Sensitive     RIFAMPIN <=0.5 SENSITIVE Sensitive     TRIMETH/SULFA <=10 SENSITIVE Sensitive     VANCOMYCIN 1 SENSITIVE Sensitive      TETRACYCLINE <=  1 SENSITIVE Sensitive     MOXIFLOXACIN <=0.25 SENSITIVE Sensitive     * FEW STAPHYLOCOCCUS AUREUS  Culture, blood (routine x 2)     Status: None (Preliminary result)   Collection Time: 03/01/15  6:55 AM  Result Value Ref Range Status   Specimen Description BLOOD RIGHT ANTECUBITAL  Final   Special Requests BOTTLES DRAWN AEROBIC AND ANAEROBIC 5CC  Final   Culture NO GROWTH 2 DAYS  Final   Report Status PENDING  Incomplete  Culture, blood (routine x 2)     Status: None (Preliminary result)   Collection Time: 03/01/15  7:15 AM  Result Value Ref Range Status   Specimen Description BLOOD RIGHT HAND  Final   Special Requests BOTTLES DRAWN AEROBIC AND ANAEROBIC 10CC  Final   Culture NO GROWTH 2 DAYS  Final   Report Status PENDING  Incomplete    Radiology Reports Dg Chest 1 View  02/27/2015  CLINICAL DATA:  Shortness of breath with exertion. EXAM: CHEST 1 VIEW COMPARISON:  February 11, 2015 chest radiograph; chest CT December 25, 2014 FINDINGS: There is scarring in the right upper lobe and right base region. There is also scarring in the left base region. There is an ill-defined nodular opacity in the left upper lobe measuring 1.6 x 1.6 cm. No edema or consolidation. Heart size and pulmonary vascularity are normal. No adenopathy. No bone lesions. IMPRESSION: Areas of scarring, stable. Ill-defined nodular opacity left upper lobe measuring 1.6 x 1.6 cm. On prior CT, there was consolidation in this area. It is possible that this current nodular opacity in the left upper lobe represents residua from that infiltrate. An underlying mass which remains is a differential consideration, however, and given this possibility, noncontrast enhanced chest CT at this time is warranted. Lungs elsewhere clear.  No change in cardiac silhouette. Electronically Signed   By: Lowella Grip III M.D.   On: 02/27/2015 10:52   Dg Ankle Complete Left  02/27/2015  CLINICAL DATA:  Redness swelling and pain about  the left ankle, status post ORIF in October 2016. EXAM: LEFT ANKLE COMPLETE - 3+ VIEW COMPARISON:  None. FINDINGS: There has been a prior tri malleolar fracture fixation. There is diffuse osteopenia. The distal tibial screws are in good position. The lateral plate and screw fixation of the distal fibula is also with satisfactory radiologic positioning. There is no evidence of acute fracture. There is no evidence of radiographically apparent loosening. There is mild callus formation about the fracture lines. There is bilateral ankle soft tissue swelling. No soft tissue emphysema. IMPRESSION: Status post tri malleolar fracture fixation with normal appearance of the orthopedic hardware. Osteopenia. Mild callus formation at the fracture lines. No evidence of acute fracture. Bimalleolar soft tissue swelling without evidence of soft tissue emphysema. Electronically Signed   By: Fidela Salisbury M.D.   On: 02/27/2015 10:49   Mr Hip Left W Wo Contrast  03/01/2015  CLINICAL DATA:  Multiple falls in the last several months causing left ankle fracture and left hip fracture. 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 EXAM: MRI OF THE LEFT HIP WITHOUT AND WITH CONTRAST TECHNIQUE: Multiplanar, multisequence MR imaging was performed both before and after administration of intravenous contrast. CONTRAST:  66m MULTIHANCE GADOBENATE DIMEGLUMINE 529 MG/ML IV SOLN COMPARISON:  02/27/2015 FINDINGS: Field heterogeneity due to the hip implant. Bones: Indistinct original intertrochanteric fracture traversed by the IM nail and screw. There is some low-level edema in the left hip intertrochanteric region and tracking along the stem  of the implant. No new fracture identified. Articular cartilage and labrum Articular cartilage: Moderate degenerative chondral thinning in the left hip joint. Labrum:  Indeterminate due to motion artifact and metal artifact. Joint or bursal effusion Joint effusion:  Trace left hip joint effusion.  Bursae: Left iliopsoas bursitis, tracking cephalad beneath the left iliacus muscle. Mild left trochanteric bursitis, collection 5.7 by 4.4 by 8.2 cm. Muscles and tendons Muscles and tendons: Generalized muscular edema but most confluent in the gluteus medius muscles, and hip adductor musculature. The adductor musculature on the left is more edematous than on the right. Hamstring tendons intact. There is edema in the left vastus intermedius and lateralis muscles as on image 35 series 4. Low-grade edema and enhancement in the lower paraspinal musculature. Other findings Miscellaneous: Mild mesenteric and presacral edema in the pelvis. Diffuse subcutaneous edema. There is no edema or enhancement in the sciatic nerves. No impingement at the sciatic notch. A left kidney lower pole cyst is suspected. SI joints normal. IMPRESSION: 1. Diffuse third spacing of fluid with subcutaneous, muscular, mesenteric, and presacral edema, and faint diffuse accentuated muscular enhancement in the pelvis and upper thighs. This is most confluent in the hip adductor musculature (left greater than right), the gluteus medius muscles, and the left upper vastus lateralis and intermedius muscles. Strictly speaking I cannot exclude infectious myositis although the generalized appearance suggests a more systemic cause for third spacing of fluid. 2. There is left trochanteric and iliopsoas bursitis but no bursitis on the right. The iliopsoas bursitis track cephalad beneath the iliacus muscle. Upper normal amount of fluid in the left hip joint. 3. Very low-level edema along the original intertrochanteric fracture and tracking along the stem of the implant which may still be due to the recent implant placement and healing fracture. Electronically Signed   By: Van Clines M.D.   On: 03/01/2015 12:12   Ct Ankle Left Wo Contrast  02/27/2015  CLINICAL DATA:  Left foot pain with redness and exudate. Protruding fixation screws. Prior open  reduction and internal fixation of fractures of the distal tibia and fibula. EXAM: CT OF THE LEFT ANKLE WITHOUT CONTRAST TECHNIQUE: Multidetector CT imaging of the left ankle was performed according to the standard protocol. Multiplanar CT image reconstructions were also generated. COMPARISON:  Radiographs dated 02/27/2015 FINDINGS: There are old healed fractures of distal tibia and fibula. 3 screws in the distal tibia are in good position. Side plate and multiple screws appear in good position in the distal tibia. No evidence of loosening of the screws. There is soft tissue edema anterior to the distal tibia and anterior lateral aspect of the proximal talus. No bone destruction to suggest osteomyelitis. No acute fractures. The tendons around the ankle appear normal. There is no ankle or subtalar joint effusion. The skin is quite thin over the second and third screws in the distal fibula counting from proximal to distal. These may be the exposed screw heads. IMPRESSION: No acute osseous abnormality. Slight soft tissue edema adjacent to the distal fibula and at the anterior lateral aspect the proximal talus just anterior to the fibula. No loosening of the hardware. The heads of the second and third screws in the distal tibia may be exposed due to break down of overlying skin. Electronically Signed   By: Lorriane Shire M.D.   On: 02/27/2015 15:34   Dg Ankle Left Port  02/27/2015  CLINICAL DATA:  Status post hardware removal with incision and drainage of left ankle. EXAM: PORTABLE LEFT ANKLE -  2 VIEW COMPARISON:  02/27/2015, earlier same day. FINDINGS: Interval retrieval of lateral fibular plate evident. Cannulated compression screws remain in the distal tibia, as before. Lateral soft tissue swelling is evident. Bones are diffusely demineralized. Vacuum device overlies the lateral soft tissues. IMPRESSION: Status post retrieval of tibial plate Electronically Signed   By: Misty Stanley M.D.   On: 02/27/2015 20:33    Dg Hip Unilat With Pelvis 2-3 Views Left  02/27/2015  CLINICAL DATA:  Left hip surgery in November. Throbbing left hip pain medially. EXAM: DG HIP (WITH OR WITHOUT PELVIS) 2-3V LEFT COMPARISON:  12/25/2014 FINDINGS: Remote left intertrochanteric hip fracture transfixed with a intra medullary nail and interlocking cannulated femoral neck screw. Ununited displaced lesser trochanteric fracture fragment. Subtle lucency in the proximal left femur in the region of the lesser trochanter fracture which may be artifactual secondary to overlapping lesser trochanteric fracture fragment versus incomplete healing. No other fracture or dislocation. No hardware failure or complication. Mild osteoarthritis of the left hip. IMPRESSION: 1. Remote left intertrochanteric hip fracture transfixed with a intra medullary nail and interlocking cannulated femoral neck screw. Ununited displaced lesser trochanteric fracture fragment. Subtle lucency in the proximal left femur in the region of the lesser trochanter fracture which may be artifactual secondary to overlapping lesser trochanteric fracture fragment versus incomplete healing. Electronically Signed   By: Kathreen Devoid   On: 02/27/2015 10:51     CBC  Recent Labs Lab 02/27/15 2500 02/28/15 0027 03/01/15 0655 03/02/15 0710 03/03/15 0828  WBC 9.8 9.5 8.1 8.0 7.2  HGB 9.8* 8.2* 7.3* 8.6* 8.7*  HCT 29.8* 25.8* 22.4* 26.3* 27.5*  PLT 534* 439* 456* 449* 515*  MCV 87.1 87.8 88.9 87.1 87.6  MCH 28.7 27.9 29.0 28.5 27.7  MCHC 32.9 31.8 32.6 32.7 31.6  RDW 17.6* 17.2* 17.3* 17.7* 17.1*  LYMPHSABS 0.7  --   --   --   --   MONOABS 0.6  --   --   --   --   EOSABS 0.0  --   --   --   --   BASOSABS 0.0  --   --   --   --     Chemistries   Recent Labs Lab 02/27/15 1038 02/28/15 0027 03/01/15 0655 03/02/15 0710 03/03/15 0828  NA 141 140 138 138 137  K 3.5 3.3* 3.4* 4.2 4.1  CL 105 104 102 105 103  CO2 '22 26 27 27 26  ' GLUCOSE 124* 161* 102* 116* 146*  BUN '14 11  8 ' 5* <5*  CREATININE 0.88 0.77 0.73 0.69 0.72  CALCIUM 8.7* 8.6* 8.2* 8.3* 8.5*  AST 85*  --  57* 98* 48*  ALT 37  --  43 32 16  ALKPHOS 497*  --  715* 645* 610*  BILITOT 0.6  --  0.6 0.6 0.4   ------------------------------------------------------------------------------------------------------------------ estimated creatinine clearance is 51.8 mL/min (by C-G formula based on Cr of 0.72). ------------------------------------------------------------------------------------------------------------------ No results for input(s): HGBA1C in the last 72 hours. ------------------------------------------------------------------------------------------------------------------ No results for input(s): CHOL, HDL, LDLCALC, TRIG, CHOLHDL, LDLDIRECT in the last 72 hours. ------------------------------------------------------------------------------------------------------------------ No results for input(s): TSH, T4TOTAL, T3FREE, THYROIDAB in the last 72 hours.  Invalid input(s): FREET3 ------------------------------------------------------------------------------------------------------------------ No results for input(s): VITAMINB12, FOLATE, FERRITIN, TIBC, IRON, RETICCTPCT in the last 72 hours.  Coagulation profile No results for input(s): INR, PROTIME in the last 168 hours.  No results for input(s): DDIMER in the last 72 hours.  Cardiac Enzymes No results for input(s): CKMB, TROPONINI, MYOGLOBIN in  the last 168 hours.  Invalid input(s): CK ------------------------------------------------------------------------------------------------------------------ Invalid input(s): POCBNP   CBG: No results for input(s): GLUCAP in the last 168 hours.     Studies: No results found.    Lab Results  Component Value Date   HGBA1C 5.3 08/07/2014   Lab Results  Component Value Date   LDLCALC 69 08/07/2014   CREATININE 0.72 03/03/2015       Scheduled Meds: . sodium chloride   Intravenous  Once  . aspirin EC  325 mg Oral Daily  . calcium carbonate  1,250 mg Oral Q breakfast  .  ceFAZolin (ANCEF) IV  2 g Intravenous 3 times per day  . enoxaparin (LOVENOX) injection  40 mg Subcutaneous Q24H  . metoprolol tartrate  25 mg Oral BID  . pantoprazole  40 mg Oral QAC breakfast  . sertraline  100 mg Oral Daily  . sodium chloride  250 mL Intravenous Once  . sodium chloride flush  3 mL Intravenous Q12H   Continuous Infusions: . 0.9 % NaCl with KCl 40 mEq / L 75 mL/hr at 03/03/15 0700    Active Problems:   Wound dehiscence   Tachycardia   Deep incisional surgical site infection   Wound cellulitis   Hip pain, acute   Postop check   Acute blood loss anemia    Time spent: 45 minutes   [facility] Pager [phone]. If 7PM-7AM, please contact night-coverage at [email or web address], password [facility] 03/03/2015, 1:13 PM  LOS: 4 days

## 2015-03-03 NOTE — Progress Notes (Signed)
[facility]   Reason for visit: Follow up on  Post op infection with hardware removal  Interval History: complains of pain.  Afebrile > 24 hours.     Physical Exam: Constitutional:  Filed Vitals:   03/03/15 0500 03/03/15 1300  BP: 164/66 139/45  Pulse: 119 103  Temp: 98 F (36.7 C) 98.4 F (36.9 C)  Resp: 18 18   patient appears in NAD Respiratory: Normal respiratory effort; CTA B Cardiovascular: RRR  Review of Systems: Constitutional: negative for chills Gastrointestinal: negative for nausea and diarrhea  Lab Results  Component Value Date   WBC 7.2 03/03/2015   HGB 8.7* 03/03/2015   HCT 27.5* 03/03/2015   MCV 87.6 03/03/2015   PLT 515* 03/03/2015    Lab Results  Component Value Date   CREATININE 0.72 03/03/2015   BUN <5* 03/03/2015   NA 137 03/03/2015   K 4.1 03/03/2015   CL 103 03/03/2015   CO2 26 03/03/2015    Lab Results  Component Value Date   ALT 16 03/03/2015   AST 48* 03/03/2015   ALKPHOS 610* 03/03/2015     Microbiology: Recent Results (from the past 240 hour(s))  Culture, blood (Routine X 2) w Reflex to ID Panel     Status: None   Collection Time: 02/27/15 10:45 AM  Result Value Ref Range Status   Specimen Description BLOOD RIGHT FOREARM  Final   Special Requests BOTTLES DRAWN AEROBIC AND ANAEROBIC 5CCS  Final   Culture  Setup Time   Final    GRAM POSITIVE COCCI IN CLUSTERS IN BOTH AEROBIC AND ANAEROBIC BOTTLES CRITICAL RESULT CALLED TO, READ BACK BY AND VERIFIED WITH: S WHITEHORN,RN@0330  02/28/15 MKELLY    Culture STAPHYLOCOCCUS AUREUS  Final   Report Status 03/02/2015 FINAL  Final   Organism ID, Bacteria STAPHYLOCOCCUS AUREUS  Final      Susceptibility   Staphylococcus aureus - MIC*    CIPROFLOXACIN <=0.5 SENSITIVE Sensitive     ERYTHROMYCIN <=0.25 SENSITIVE Sensitive     GENTAMICIN <=0.5 SENSITIVE Sensitive     OXACILLIN 0.5 SENSITIVE Sensitive     TETRACYCLINE <=1 SENSITIVE Sensitive     VANCOMYCIN 1  SENSITIVE Sensitive     TRIMETH/SULFA <=10 SENSITIVE Sensitive     CLINDAMYCIN <=0.25 SENSITIVE Sensitive     RIFAMPIN <=0.5 SENSITIVE Sensitive     Inducible Clindamycin NEGATIVE Sensitive     * STAPHYLOCOCCUS AUREUS  Culture, blood (Routine X 2) w Reflex to ID Panel     Status: None   Collection Time: 02/27/15 10:50 AM  Result Value Ref Range Status   Specimen Description BLOOD RIGHT HAND  Final   Special Requests BOTTLES DRAWN AEROBIC AND ANAEROBIC 5CCS  Final   Culture  Setup Time   Final    GRAM POSITIVE COCCI IN CLUSTERS IN PAIRS IN BOTH AEROBIC AND ANAEROBIC BOTTLES CRITICAL RESULT CALLED TO, READ BACK BY AND VERIFIED WITH: S WHITEHORN@0156  02/28/15 MKELLY    Culture   Final    STAPHYLOCOCCUS AUREUS SUSCEPTIBILITIES PERFORMED ON PREVIOUS CULTURE WITHIN THE LAST 5 DAYS.    Report Status 03/02/2015 FINAL  Final  Anaerobic culture     Status: None (Preliminary result)   Collection Time: 02/27/15  7:11 PM  Result Value Ref Range Status   Specimen Description ABSCESS LEFT ANKLE  Final   Special Requests PATIENT ON FOLLOWING VANC  Final   Gram Stain   Final    RARE WBC PRESENT, PREDOMINANTLY PMN NO SQUAMOUS  EPITHELIAL CELLS SEEN NO ORGANISMS SEEN Performed at [facility]    Culture   Final    NO ANAEROBES ISOLATED; CULTURE IN PROGRESS FOR 5 DAYS Performed at [facility]    Report Status PENDING  Incomplete  Culture, routine-abscess     Status: None   Collection Time: 02/27/15  7:11 PM  Result Value Ref Range Status   Specimen Description ABSCESS LEFT ANKLE  Final   Special Requests PATIENT ON FOLLOWING VANC  Final   Gram Stain   Final    FEW WBC PRESENT,BOTH PMN AND MONONUCLEAR FEW SQUAMOUS EPITHELIAL CELLS PRESENT RARE GRAM POSITIVE COCCI IN PAIRS Performed at [facility]    Culture   Final    FEW STAPHYLOCOCCUS AUREUS Note: RIFAMPIN AND GENTAMICIN SHOULD NOT BE USED AS SINGLE DRUGS FOR TREATMENT OF STAPH INFECTIONS. Performed at [facility]    Report Status 03/02/2015 FINAL  Final   Organism ID, Bacteria STAPHYLOCOCCUS AUREUS  Final      Susceptibility   Staphylococcus aureus - MIC*    CLINDAMYCIN <=0.25 SENSITIVE Sensitive     ERYTHROMYCIN <=0.25 SENSITIVE Sensitive     GENTAMICIN <=0.5 SENSITIVE Sensitive     LEVOFLOXACIN 0.25 SENSITIVE Sensitive     OXACILLIN 0.5 SENSITIVE Sensitive     RIFAMPIN <=0.5 SENSITIVE Sensitive     TRIMETH/SULFA <=10 SENSITIVE Sensitive     VANCOMYCIN 1 SENSITIVE Sensitive     TETRACYCLINE <=1 SENSITIVE Sensitive     MOXIFLOXACIN <=0.25 SENSITIVE Sensitive     * FEW STAPHYLOCOCCUS AUREUS  Culture, blood (routine x 2)     Status: None (Preliminary result)   Collection Time: 03/01/15  6:55 AM  Result Value Ref Range Status   Specimen Description BLOOD RIGHT ANTECUBITAL  Final   Special Requests BOTTLES DRAWN AEROBIC AND ANAEROBIC 5CC  Final   Culture NO GROWTH 2 DAYS  Final   Report Status PENDING  Incomplete  Culture, blood (routine x 2)     Status: None (Preliminary result)   Collection Time: 03/01/15  7:15 AM  Result Value Ref Range Status   Specimen Description BLOOD RIGHT HAND  Final   Special Requests BOTTLES DRAWN AEROBIC AND ANAEROBIC 10CC  Final   Culture NO GROWTH 2 DAYS  Final   Report Status PENDING  Incomplete    Impression/Plan:  1. Post op infection - growing MSSA.   Continue with cefazolin through March 9th Antibiotics per SNF protocol  2. Staph aureus bacteremia - from #1.  TTE not yet done.  If no concerns on TTE, no TEE indicated since she will be getting a prolonged course of antibiotics.  picc line ok in the am if repeat blood cultures remain negative  Dr. Drue Second on tomorrow

## 2015-03-04 ENCOUNTER — Inpatient Hospital Stay (HOSPITAL_COMMUNITY): Payer: Medicare Other

## 2015-03-04 DIAGNOSIS — R509 Fever, unspecified: Secondary | ICD-10-CM

## 2015-03-04 DIAGNOSIS — L02416 Cutaneous abscess of left lower limb: Secondary | ICD-10-CM

## 2015-03-04 DIAGNOSIS — B9561 Methicillin susceptible Staphylococcus aureus infection as the cause of diseases classified elsewhere: Secondary | ICD-10-CM

## 2015-03-04 DIAGNOSIS — T8140XA Infection following a procedure, unspecified, initial encounter: Secondary | ICD-10-CM | POA: Insufficient documentation

## 2015-03-04 DIAGNOSIS — R7881 Bacteremia: Secondary | ICD-10-CM | POA: Insufficient documentation

## 2015-03-04 DIAGNOSIS — Y838 Other surgical procedures as the cause of abnormal reaction of the patient, or of later complication, without mention of misadventure at the time of the procedure: Secondary | ICD-10-CM

## 2015-03-04 DIAGNOSIS — T84629D Infection and inflammatory reaction due to internal fixation device of unspecified bone of leg, subsequent encounter: Secondary | ICD-10-CM

## 2015-03-04 LAB — CBC
HCT: 23.7 % — ABNORMAL LOW (ref 36.0–46.0)
HEMOGLOBIN: 7.7 g/dL — AB (ref 12.0–15.0)
MCH: 28.5 pg (ref 26.0–34.0)
MCHC: 32.5 g/dL (ref 30.0–36.0)
MCV: 87.8 fL (ref 78.0–100.0)
Platelets: 465 10*3/uL — ABNORMAL HIGH (ref 150–400)
RBC: 2.7 MIL/uL — AB (ref 3.87–5.11)
RDW: 16.8 % — ABNORMAL HIGH (ref 11.5–15.5)
WBC: 7.6 10*3/uL (ref 4.0–10.5)

## 2015-03-04 MED ORDER — HEPARIN SOD (PORK) LOCK FLUSH 100 UNIT/ML IV SOLN
250.0000 [IU] | INTRAVENOUS | Status: AC | PRN
  Administered 2015-03-04: 250 [IU]

## 2015-03-04 MED ORDER — CEFAZOLIN SODIUM-DEXTROSE 2-3 GM-% IV SOLR: 2.0000 g | Freq: Three times a day (TID) | INTRAVENOUS | Status: DC

## 2015-03-04 MED ORDER — OXYCODONE HCL 5 MG PO TABS: 5.0000 mg | ORAL_TABLET | ORAL | Status: DC | PRN

## 2015-03-04 MED ORDER — METOPROLOL TARTRATE 25 MG PO TABS: 25.0000 mg | ORAL_TABLET | Freq: Two times a day (BID) | ORAL | Status: AC

## 2015-03-04 NOTE — Clinical Social Work Placement (Addendum)
   CLINICAL SOCIAL WORK PLACEMENT  NOTE  Date:  03/04/2015  Patient Details  Name: [name] MRN: [id number] Date of Birth: Feb 06, 1945  Clinical Social Work is seeking post-discharge placement for this patient at the Skilled  Nursing Facility level of care (*CSW will initial, date and re-position this form in  chart as items are completed):  Yes   Patient/family provided with [facility] Social Work Department's list of facilities offering this level of care within the geographic area requested by the patient (or if unable, by the patient's family).  Yes   Patient/family informed of their freedom to choose among providers that offer the needed level of care, that participate in Medicare, Medicaid or managed care program needed by the patient, have an available bed and are willing to accept the patient.  Yes   Patient/family informed of [facility]'s ownership interest in [facility] and [facility], as well as of the fact that they are under no obligation to receive care at these facilities.  PASRR submitted to EDS on 03/04/15     PASRR number received on 03/04/15     Existing PASRR number confirmed on       FL2 transmitted to all facilities in geographic area requested by pt/family on 03/04/15     FL2 transmitted to all facilities within larger geographic area on       Patient informed that his/her managed care company has contracts with or will negotiate with certain facilities, including the following:            Patient/family informed of bed offers received.  Patient chooses bed at       Physician recommends and patient chooses bed at      Patient to be transferred to   on  .  Patient to be transferred to facility by       Patient family notified on   of transfer.  Name of family member notified:        PHYSICIAN       Additional Comment:    _______________________________________________ Rondel Baton, LCSW 03/04/2015, 10:24 AM

## 2015-03-04 NOTE — Progress Notes (Signed)
error 

## 2015-03-04 NOTE — Discharge Summary (Signed)
Physician Discharge Summary  [name] BOF:[id number] DOB: 1945-04-23 DOA: 02/27/2015  PCP: [facility]  Admit date: 02/27/2015 Discharge date: 03/04/2015  Time spent: 35 minutes  Recommendations for Outpatient Follow-up:  1. Needs to continue Cefazolin iv q8 PICC until May 13 Labs weekly while on IV antibiotics: _x CBC with differential _x_ CMP _x_ CRP- last week of tx only _x_ ESR - last week of tx only Fax weekly labs to [phone] Clinic Follow Up Appt: 5-6 week @ [facility] 2. Should follow up with orthopedics as an outpatient 3. PICC line to be removed at nursing facility once antibiotic course is complete 4. Up-titrate BB as OP   Discharge Diagnoses:  Active Problems:   Wound dehiscence   Tachycardia   Deep incisional surgical site infection   Wound cellulitis   Hip pain, acute   Postop check   Acute blood loss anemia   Discharge Condition: fair  Diet recommendation:  good  Filed Weights   02/27/15 0927  Weight: 53.071 kg (117 lb)    History of present illness:  70 year old female Known history of bipolar Somatic chest pain Recent admission ankle fracture treated in [place] October 2016 Admitted to [facility] 12/25/14 with left proximal intratrochanteric fracture ? Left renal mass on CT chest 12/2014 for MRI History of sinus tachycardia and chronic diastolic heart failure Anemia 02/26/14 with discharge from ankle brace and exposed screws along the lateral aspect left ankle and small amount of bogginess and purulence  Readmitted to the hospital again 1/27 with infected left ankle retained hardware Underwent debridement of left ankle wound and removal of the implant  Hospital Course:  Assessment and plan Infected left ankle wound with retained hardware, now with GPC bacteremia Blood culture positive for gram-positive cocci in pairs-Continue with cefazolin through March 9th Currently on cefazolin, infectious disease following,  recommending total of 6 weeks Repeat blood cultures obtained 1/29 showing some Staphylococcus,  PICC line placed 1/31 Transthoracic echo no evidence of endocarditis follow up with ID in 3-4 weeks [appt to be given]  Left hip pain Given bacteremia patient underwent MRI of the left hip to r/o septic arthritis,  Left hip MRI complete - reviewed by orthopedics, looks as expected for healing intertroch hip fracture No fluid collection or hip effusion to suggest septic hip Hip bursitis or arthritis likely for daignosis, plan as per orthopedics  Hypokalemia Replete potassium  Sinus tachycardia.  Evaluated by Cardiology in November 11/15. Likely in the setting of low-grade fever, sepsis  Continue metoprolol at the current dose, in the setting of possible sepsis, patient was supposed to be taking 25 mg BID at home per previous cardiology notes Continue on gentle IV hydration , continues to have low-grade fever Continue metoprolol to 25 mg by mouth twice a day   Elevated lactic acid. Suspect from dehydration. Low suspicion for sepsis.  Anemia of chronic disease -baseline around 8.5-9, likely dehydrated yesterday with a hemoglobin of 9.8, hemoglobin dropped to 7.3 , status post transfusion of 1 unit, hemoglobin now 7.7 Anemia panel consistent with anemia of chronic diseae 8 weekly CBC done Consider iron repletion as an outpatient   Depression. -continue home Zoloft  History of grade I diastolic dysfunction on echo July 2016. No evidence for acute decompensation at present.   Elevated alk phos. Likely originating from the bone. Normal in November when here for fracture. Etiology not clear.  She has a mildly elevated AST as well. Remainder of LFTs normal.  Vitamin D level 31th    Procedures:  Debridement 1/27 Dr.'s contact   Consultations:  Orthopedics Dr. Jodi Mourning  Infectious disease Dr. Graylon Good  Discharge Exam: Filed Vitals:   03/03/15 2013 03/04/15 0002  BP: 153/61  143/52  Pulse: 120 118  Temp: 100.1 F (37.8 C) 99.5 F (37.5 C)  Resp: 17 18    General: Alert oriented no apparent distress Cardiovascular: S1-S2 no murmur rub or gallop Respiratory: Clinically clear no added sound  Discharge Instructions    Current Discharge Medication List    START taking these medications   Details  ceFAZolin (ANCEF) 2-3 GM-% SOLR Inject 50 mLs (2 g total) into the vein every 8 (eight) hours. Qty: 50 mL, Refills: 0    oxyCODONE (OXY IR/ROXICODONE) 5 MG immediate release tablet Take 1-2 tablets (5-10 mg total) by mouth every 4 (four) hours as needed for moderate pain. Qty: 30 tablet, Refills: 0      CONTINUE these medications which have CHANGED   Details  metoprolol tartrate (LOPRESSOR) 25 MG tablet Take 1 tablet (25 mg total) by mouth 2 (two) times daily.      CONTINUE these medications which have NOT CHANGED   Details  aspirin 325 MG tablet Take 325 mg by mouth daily.    calcium carbonate (OSCAL) 1500 (600 Ca) MG TABS tablet Take 600 mg of elemental calcium by mouth daily with breakfast.    pantoprazole (PROTONIX) 40 MG tablet Take 40 mg by mouth daily. Before breakfast    sertraline (ZOLOFT) 100 MG tablet Take 100 mg by mouth daily.       Allergies  Allergen Reactions  . Sulfa Antibiotics     Hives    Follow-up Information    Follow up with Swinteck, Horald Pollen, MD. Schedule an appointment as soon as possible for a visit in 2 weeks.   Specialty:  Orthopedic Surgery   Why:  For wound re-check   Contact information:   [address] [place] [address] [phone]        The results of significant diagnostics from this hospitalization (including imaging, microbiology, ancillary and laboratory) are listed below for reference.    Significant Diagnostic Studies: Dg Chest 1 View  02/27/2015  CLINICAL DATA:  Shortness of breath with exertion. EXAM: CHEST 1 VIEW COMPARISON:  February 11, 2015 chest radiograph; chest CT  December 25, 2014 FINDINGS: There is scarring in the right upper lobe and right base region. There is also scarring in the left base region. There is an ill-defined nodular opacity in the left upper lobe measuring 1.6 x 1.6 cm. No edema or consolidation. Heart size and pulmonary vascularity are normal. No adenopathy. No bone lesions. IMPRESSION: Areas of scarring, stable. Ill-defined nodular opacity left upper lobe measuring 1.6 x 1.6 cm. On prior CT, there was consolidation in this area. It is possible that this current nodular opacity in the left upper lobe represents residua from that infiltrate. An underlying mass which remains is a differential consideration, however, and given this possibility, noncontrast enhanced chest CT at this time is warranted. Lungs elsewhere clear.  No change in cardiac silhouette. Electronically Signed   By: Lowella Grip III M.D.   On: 02/27/2015 10:52   Dg Ankle Complete Left  02/27/2015  CLINICAL DATA:  Redness swelling and pain about the left ankle, status post ORIF in October 2016. EXAM: LEFT ANKLE COMPLETE - 3+ VIEW COMPARISON:  None. FINDINGS: There has been a prior tri malleolar fracture fixation. There  is diffuse osteopenia. The distal tibial screws are in good position. The lateral plate and screw fixation of the distal fibula is also with satisfactory radiologic positioning. There is no evidence of acute fracture. There is no evidence of radiographically apparent loosening. There is mild callus formation about the fracture lines. There is bilateral ankle soft tissue swelling. No soft tissue emphysema. IMPRESSION: Status post tri malleolar fracture fixation with normal appearance of the orthopedic hardware. Osteopenia. Mild callus formation at the fracture lines. No evidence of acute fracture. Bimalleolar soft tissue swelling without evidence of soft tissue emphysema. Electronically Signed   By: Fidela Salisbury M.D.   On: 02/27/2015 10:49   Mr Hip Left W Wo  Contrast  03/01/2015  CLINICAL DATA:  Multiple falls in the last several months causing left ankle fracture and left hip fracture. 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 EXAM: MRI OF THE LEFT HIP WITHOUT AND WITH CONTRAST TECHNIQUE: Multiplanar, multisequence MR imaging was performed both before and after administration of intravenous contrast. CONTRAST:  63m MULTIHANCE GADOBENATE DIMEGLUMINE 529 MG/ML IV SOLN COMPARISON:  02/27/2015 FINDINGS: Field heterogeneity due to the hip implant. Bones: Indistinct original intertrochanteric fracture traversed by the IM nail and screw. There is some low-level edema in the left hip intertrochanteric region and tracking along the stem of the implant. No new fracture identified. Articular cartilage and labrum Articular cartilage: Moderate degenerative chondral thinning in the left hip joint. Labrum:  Indeterminate due to motion artifact and metal artifact. Joint or bursal effusion Joint effusion:  Trace left hip joint effusion. Bursae: Left iliopsoas bursitis, tracking cephalad beneath the left iliacus muscle. Mild left trochanteric bursitis, collection 5.7 by 4.4 by 8.2 cm. Muscles and tendons Muscles and tendons: Generalized muscular edema but most confluent in the gluteus medius muscles, and hip adductor musculature. The adductor musculature on the left is more edematous than on the right. Hamstring tendons intact. There is edema in the left vastus intermedius and lateralis muscles as on image 35 series 4. Low-grade edema and enhancement in the lower paraspinal musculature. Other findings Miscellaneous: Mild mesenteric and presacral edema in the pelvis. Diffuse subcutaneous edema. There is no edema or enhancement in the sciatic nerves. No impingement at the sciatic notch. A left kidney lower pole cyst is suspected. SI joints normal. IMPRESSION: 1. Diffuse third spacing of fluid with subcutaneous, muscular, mesenteric, and presacral edema, and faint diffuse  accentuated muscular enhancement in the pelvis and upper thighs. This is most confluent in the hip adductor musculature (left greater than right), the gluteus medius muscles, and the left upper vastus lateralis and intermedius muscles. Strictly speaking I cannot exclude infectious myositis although the generalized appearance suggests a more systemic cause for third spacing of fluid. 2. There is left trochanteric and iliopsoas bursitis but no bursitis on the right. The iliopsoas bursitis track cephalad beneath the iliacus muscle. Upper normal amount of fluid in the left hip joint. 3. Very low-level edema along the original intertrochanteric fracture and tracking along the stem of the implant which may still be due to the recent implant placement and healing fracture. Electronically Signed   By: WVan ClinesM.D.   On: 03/01/2015 12:12   Ct Ankle Left Wo Contrast  02/27/2015  CLINICAL DATA:  Left foot pain with redness and exudate. Protruding fixation screws. Prior open reduction and internal fixation of fractures of the distal tibia and fibula. EXAM: CT OF THE LEFT ANKLE WITHOUT CONTRAST TECHNIQUE: Multidetector CT imaging of the left ankle was performed  according to the standard protocol. Multiplanar CT image reconstructions were also generated. COMPARISON:  Radiographs dated 02/27/2015 FINDINGS: There are old healed fractures of distal tibia and fibula. 3 screws in the distal tibia are in good position. Side plate and multiple screws appear in good position in the distal tibia. No evidence of loosening of the screws. There is soft tissue edema anterior to the distal tibia and anterior lateral aspect of the proximal talus. No bone destruction to suggest osteomyelitis. No acute fractures. The tendons around the ankle appear normal. There is no ankle or subtalar joint effusion. The skin is quite thin over the second and third screws in the distal fibula counting from proximal to distal. These may be the  exposed screw heads. IMPRESSION: No acute osseous abnormality. Slight soft tissue edema adjacent to the distal fibula and at the anterior lateral aspect the proximal talus just anterior to the fibula. No loosening of the hardware. The heads of the second and third screws in the distal tibia may be exposed due to break down of overlying skin. Electronically Signed   By: Lorriane Shire M.D.   On: 02/27/2015 15:34   Dg Ankle Left Port  02/27/2015  CLINICAL DATA:  Status post hardware removal with incision and drainage of left ankle. EXAM: PORTABLE LEFT ANKLE - 2 VIEW COMPARISON:  02/27/2015, earlier same day. FINDINGS: Interval retrieval of lateral fibular plate evident. Cannulated compression screws remain in the distal tibia, as before. Lateral soft tissue swelling is evident. Bones are diffusely demineralized. Vacuum device overlies the lateral soft tissues. IMPRESSION: Status post retrieval of tibial plate Electronically Signed   By: Misty Stanley M.D.   On: 02/27/2015 20:33   Dg Hip Unilat With Pelvis 2-3 Views Left  02/27/2015  CLINICAL DATA:  Left hip surgery in November. Throbbing left hip pain medially. EXAM: DG HIP (WITH OR WITHOUT PELVIS) 2-3V LEFT COMPARISON:  12/25/2014 FINDINGS: Remote left intertrochanteric hip fracture transfixed with a intra medullary nail and interlocking cannulated femoral neck screw. Ununited displaced lesser trochanteric fracture fragment. Subtle lucency in the proximal left femur in the region of the lesser trochanter fracture which may be artifactual secondary to overlapping lesser trochanteric fracture fragment versus incomplete healing. No other fracture or dislocation. No hardware failure or complication. Mild osteoarthritis of the left hip. IMPRESSION: 1. Remote left intertrochanteric hip fracture transfixed with a intra medullary nail and interlocking cannulated femoral neck screw. Ununited displaced lesser trochanteric fracture fragment. Subtle lucency in the proximal  left femur in the region of the lesser trochanter fracture which may be artifactual secondary to overlapping lesser trochanteric fracture fragment versus incomplete healing. Electronically Signed   By: Kathreen Devoid   On: 02/27/2015 10:51    Microbiology: Recent Results (from the past 240 hour(s))  Culture, blood (Routine X 2) w Reflex to ID Panel     Status: None   Collection Time: 02/27/15 10:45 AM  Result Value Ref Range Status   Specimen Description BLOOD RIGHT FOREARM  Final   Special Requests BOTTLES DRAWN AEROBIC AND ANAEROBIC 5CCS  Final   Culture  Setup Time   Final    GRAM POSITIVE COCCI IN CLUSTERS IN BOTH AEROBIC AND ANAEROBIC BOTTLES CRITICAL RESULT CALLED TO, READ BACK BY AND VERIFIED WITH: S WHITEHORN,RN_[id number]  02/28/15 MKELLY    Culture STAPHYLOCOCCUS AUREUS  Final   Report Status 03/02/2015 FINAL  Final   Organism ID, Bacteria STAPHYLOCOCCUS AUREUS  Final      Susceptibility   Staphylococcus aureus - MIC*  CIPROFLOXACIN <=0.5 SENSITIVE Sensitive     ERYTHROMYCIN <=0.25 SENSITIVE Sensitive     GENTAMICIN <=0.5 SENSITIVE Sensitive     OXACILLIN 0.5 SENSITIVE Sensitive     TETRACYCLINE <=1 SENSITIVE Sensitive     VANCOMYCIN 1 SENSITIVE Sensitive     TRIMETH/SULFA <=10 SENSITIVE Sensitive     CLINDAMYCIN <=0.25 SENSITIVE Sensitive     RIFAMPIN <=0.5 SENSITIVE Sensitive     Inducible Clindamycin NEGATIVE Sensitive     * STAPHYLOCOCCUS AUREUS  Culture, blood (Routine X 2) w Reflex to ID Panel     Status: None   Collection Time: 02/27/15 10:50 AM  Result Value Ref Range Status   Specimen Description BLOOD RIGHT HAND  Final   Special Requests BOTTLES DRAWN AEROBIC AND ANAEROBIC 5CCS  Final   Culture  Setup Time   Final    GRAM POSITIVE COCCI IN CLUSTERS IN PAIRS IN BOTH AEROBIC AND ANAEROBIC BOTTLES CRITICAL RESULT CALLED TO, READ BACK BY AND VERIFIED WITH: S [email or web address]  02/28/15 MKELLY    Culture   Final    STAPHYLOCOCCUS AUREUS SUSCEPTIBILITIES PERFORMED ON  PREVIOUS CULTURE WITHIN THE LAST 5 DAYS.    Report Status 03/02/2015 FINAL  Final  Anaerobic culture     Status: None (Preliminary result)   Collection Time: 02/27/15  7:11 PM  Result Value Ref Range Status   Specimen Description ABSCESS LEFT ANKLE  Final   Special Requests PATIENT ON FOLLOWING VANC  Final   Gram Stain   Final    RARE WBC PRESENT, PREDOMINANTLY PMN NO SQUAMOUS EPITHELIAL CELLS SEEN NO ORGANISMS SEEN Performed at [facility]    Culture   Final    NO ANAEROBES ISOLATED; CULTURE IN PROGRESS FOR 5 DAYS Performed at [facility]    Report Status PENDING  Incomplete  Culture, routine-abscess     Status: None   Collection Time: 02/27/15  7:11 PM  Result Value Ref Range Status   Specimen Description ABSCESS LEFT ANKLE  Final   Special Requests PATIENT ON FOLLOWING VANC  Final   Gram Stain   Final    FEW WBC PRESENT,BOTH PMN AND MONONUCLEAR FEW SQUAMOUS EPITHELIAL CELLS PRESENT RARE GRAM POSITIVE COCCI IN PAIRS Performed at [facility]    Culture   Final    FEW STAPHYLOCOCCUS AUREUS Note: RIFAMPIN AND GENTAMICIN SHOULD NOT BE USED AS SINGLE DRUGS FOR TREATMENT OF STAPH INFECTIONS. Performed at [facility]    Report Status 03/02/2015 FINAL  Final   Organism ID, Bacteria STAPHYLOCOCCUS AUREUS  Final      Susceptibility   Staphylococcus aureus - MIC*    CLINDAMYCIN <=0.25 SENSITIVE Sensitive     ERYTHROMYCIN <=0.25 SENSITIVE Sensitive     GENTAMICIN <=0.5 SENSITIVE Sensitive     LEVOFLOXACIN 0.25 SENSITIVE Sensitive     OXACILLIN 0.5 SENSITIVE Sensitive     RIFAMPIN <=0.5 SENSITIVE Sensitive     TRIMETH/SULFA <=10 SENSITIVE Sensitive     VANCOMYCIN 1 SENSITIVE Sensitive     TETRACYCLINE <=1 SENSITIVE Sensitive     MOXIFLOXACIN <=0.25 SENSITIVE Sensitive     * FEW STAPHYLOCOCCUS AUREUS  Culture, blood (routine x 2)     Status: None (Preliminary result)   Collection Time: 03/01/15  6:55 AM  Result Value Ref Range Status    Specimen Description BLOOD RIGHT ANTECUBITAL  Final   Special Requests BOTTLES DRAWN AEROBIC AND ANAEROBIC 5CC  Final   Culture NO GROWTH 2 DAYS  Final   Report Status PENDING  Incomplete  Culture, blood (routine x 2)     Status: None (Preliminary result)   Collection Time: 03/01/15  7:15 AM  Result Value Ref Range Status   Specimen Description BLOOD RIGHT HAND  Final   Special Requests BOTTLES DRAWN AEROBIC AND ANAEROBIC 10CC  Final   Culture NO GROWTH 2 DAYS  Final   Report Status PENDING  Incomplete     Labs: Basic Metabolic Panel:  Recent Labs Lab 02/27/15 1038 02/28/15 0027 03/01/15 0655 03/02/15 0710 03/03/15 0828  NA 141 140 138 138 137  K 3.5 3.3* 3.4* 4.2 4.1  CL 105 104 102 105 103  CO2 _0 GLUCOSE 124* 161* 102* 116* 146*  BUN _1 5* <5*  CREATININE 0.88 0.77 0.73 0.69 0.72  CALCIUM 8.7* 8.6* 8.2* 8.3* 8.5*   Liver Function Tests:  Recent Labs Lab 02/27/15 1038 03/01/15 0655 03/02/15 0710 03/03/15 0828  AST 85* 57* 98* 48*  ALT 37 43 32 16  ALKPHOS 497* 715* 645* 610*  BILITOT 0.6 0.6 0.6 0.4  PROT 6.5 5.3* 5.6* 5.5*  ALBUMIN 2.4* 1.8* 1.8* 1.8*   No results for input(s): LIPASE, AMYLASE in the last 168 hours. No results for input(s): AMMONIA in the last 168 hours. CBC:  Recent Labs Lab 02/27/15 0939 02/28/15 0027 03/01/15 0655 03/02/15 0710 03/03/15 0828 03/04/15 0520  WBC 9.8 9.5 8.1 8.0 7.2 7.6  NEUTROABS 8.4*  --   --   --   --   --   HGB 9.8* 8.2* 7.3* 8.6* 8.7* 7.7*  HCT 29.8* 25.8* 22.4* 26.3* 27.5* 23.7*  MCV 87.1 87.8 88.9 87.1 87.6 87.8  PLT 534* 439* 456* 449* 515* 465*   Cardiac Enzymes: No results for input(s): CKTOTAL, CKMB, CKMBINDEX, TROPONINI in the last 168 hours. BNP: BNP (last 3 results)  Recent Labs  02/27/15 1038  BNP 272.4*    ProBNP (last 3 results) No results for input(s): PROBNP in the last 8760 hours.  CBG: No results for input(s): GLUCAP in the last 168  hours.     SignedNita Sells MD   [facility] 03/04/2015, 9:25 AM   8

## 2015-03-04 NOTE — Care Management (Signed)
Case manager spoke with Dr. Linna Caprice regarding measurements for patient's wound. Per Dr. Linna Caprice patient has eroded skin , there are 3 wounds measuring 1cm by 1 cm each, depth is 2 mm.  there is no tunneling and no odor or drainage. Patient will need wound vac for 2 weeks.

## 2015-03-04 NOTE — NC FL2 (Signed)
[place] MEDICAID FL2 LEVEL OF CARE SCREENING TOOL     IDENTIFICATION  Patient Name: [name] Birthdate: [birth year] Sex: female Admission Date (Current Location): 02/27/2015  [facility] and [place] Number:  Roda Shutters (Daughter lives in [place])   Facility and Address:         Provider Number: [id number]  Attending Physician Name and Address:  Rhetta Mura, MD  Relative Name and Phone Number:       Current Level of Care: Hospital Recommended Level of Care: Skilled Nursing Facility Prior Approval Number:    Date Approved/Denied:   PASRR Number: [id number]  Discharge Plan: SNF    Current Diagnoses: Patient Active Problem List   Diagnosis Date Noted  . Acute blood loss anemia 03/01/2015  . Hip pain, acute   . Postop check   . Wound cellulitis   . Wound dehiscence 02/27/2015  . Tachycardia 02/27/2015  . Deep incisional surgical site infection 02/27/2015  . History of diastolic dysfunction 12/25/2014  . Closed left hip fracture (HCC) 12/25/2014  . History of sinus tachycardia 12/25/2014  . Anxiety 12/25/2014  . PNA (pneumonia) 12/25/2014  . Fracture, intertrochanteric, left femur (HCC) 12/25/2014  . Abnormal chest x-ray   . Sinus tachycardia (HCC) 12/16/2014  . Right ventricular hypertrophy 12/16/2014  . Left Trimalleolar fracture with displacement of the ankle joint S/P ORIF 11/26/2014  . Essential hypertension 11/26/2014  . Encounter for central line care 11/26/2014  . HCAP (healthcare-associated pneumonia) 11/26/2014  . Leukocytosis 11/26/2014  . Chronic depression 11/26/2014  . Esophageal reflux 11/26/2014  . Protein-calorie malnutrition (HCC) 11/26/2014  . Chest pain 08/07/2014  . Anemia 08/07/2014  . AKI (acute kidney injury) (HCC) 08/07/2014  . Hyperglycemia 08/07/2014  . Hyponatremia 08/07/2014  . Hypoxia 05/18/2014  . Angina decubitus (HCC) 07/20/2011  . Palpitations 07/20/2011  . Hypercholesterolemia 07/20/2011  . Family  history of coronary artery disease 07/20/2011    Orientation RESPIRATION BLADDER Height & Weight     Self, Time, Situation, Place  Normal Continent Weight: 117 lb (53.071 kg) Height:   (157.5 cm)  BEHAVIORAL SYMPTOMS/MOOD NEUROLOGICAL BOWEL NUTRITION STATUS      Continent    AMBULATORY STATUS COMMUNICATION OF NEEDS Skin   Extensive Assist Verbally Surgical wounds (Wound vac)                       Personal Care Assistance Level of Assistance  Dressing, Bathing Bathing Assistance: Limited assistance   Dressing Assistance: Limited assistance     Functional Limitations Info             SPECIAL CARE FACTORS FREQUENCY  PT (By licensed PT), OT (By licensed OT) (IV antibiotics needed at time of discharge- Ancef)     PT Frequency: daily OT Frequency: daily            Contractures Contractures Info: Not present    Additional Factors Info                  Current Medications (03/04/2015):  This is the current hospital active medication list Current Facility-Administered Medications  Medication Dose Route Frequency Provider Last Rate Last Dose  . 0.9 %  sodium chloride infusion   Intravenous Once Richarda Overlie, MD      . 0.9 % NaCl with KCl 40 mEq / L  infusion   Intravenous Continuous Richarda Overlie, MD 75 mL/hr at 03/04/15 0845 75 mL/hr at 03/04/15 0845  . acetaminophen (TYLENOL) tablet 650 mg  650  mg Oral Q6H PRN Samson Frederic, MD   650 mg at 03/02/15 1700   Or  . acetaminophen (TYLENOL) suppository 650 mg  650 mg Rectal Q6H PRN Samson Frederic, MD      . aspirin EC tablet 325 mg  325 mg Oral Daily Meredith Pel, NP   325 mg at 03/04/15 0956  . bisacodyl (DULCOLAX) suppository 10 mg  10 mg Rectal Daily PRN Meredith Pel, NP      . calcium carbonate (OS-CAL - dosed in mg of elemental calcium) tablet 1,250 mg  1,250 mg Oral Q breakfast Scarlett Presto, RPH   1,250 mg at 03/04/15 0750  . ceFAZolin (ANCEF) IVPB 2 g/50 mL premix  2 g Intravenous 3 times per day  Gardiner Barefoot, MD   2 g at 03/04/15 0536  . enoxaparin (LOVENOX) injection 40 mg  40 mg Subcutaneous Q24H Samson Frederic, MD   40 mg at 03/04/15 0750  . feeding supplement (ENSURE ENLIVE) (ENSURE ENLIVE) liquid 237 mL  237 mL Oral BID BM Marinell Blight, RD   237 mL at 03/03/15 1738  . metoCLOPramide (REGLAN) tablet 5-10 mg  5-10 mg Oral Q8H PRN Samson Frederic, MD       Or  . metoCLOPramide (REGLAN) injection 5-10 mg  5-10 mg Intravenous Q8H PRN Samson Frederic, MD      . metoprolol tartrate (LOPRESSOR) tablet 25 mg  25 mg Oral BID Richarda Overlie, MD   25 mg at 03/04/15 0956  . ondansetron (ZOFRAN) tablet 4 mg  4 mg Oral Q6H PRN Samson Frederic, MD   4 mg at 03/02/15 1037   Or  . ondansetron (ZOFRAN) injection 4 mg  4 mg Intravenous Q6H PRN Samson Frederic, MD   4 mg at 03/04/15 1610  . oxyCODONE (Oxy IR/ROXICODONE) immediate release tablet 10-15 mg  10-15 mg Oral Q4H PRN Richarda Overlie, MD   10 mg at 03/04/15 0950  . pantoprazole (PROTONIX) EC tablet 40 mg  40 mg Oral QAC breakfast Meredith Pel, NP   40 mg at 03/04/15 0750  . senna-docusate (Senokot-S) tablet 1 tablet  1 tablet Oral QHS PRN Meredith Pel, NP      . sertraline (ZOLOFT) tablet 100 mg  100 mg Oral Daily Meredith Pel, NP   100 mg at 03/04/15 0956  . sodium chloride 0.9 % bolus 250 mL  250 mL Intravenous Once Richarda Overlie, MD      . sodium chloride flush (NS) 0.9 % injection 10-40 mL  10-40 mL Intracatheter PRN Richarda Overlie, MD      . sodium chloride flush (NS) 0.9 % injection 3 mL  3 mL Intravenous Q12H Meredith Pel, NP   3 mL at 03/02/15 2200     Discharge Medications: Please see discharge summary for a list of discharge medications.  Relevant Imaging Results:  Relevant Lab Results:   Additional Information SSN: [id number]  Rondel Baton, LCSW

## 2015-03-04 NOTE — Progress Notes (Signed)
Report called to RN at [facility].

## 2015-03-04 NOTE — Progress Notes (Signed)
  Echocardiogram 2D Echocardiogram has been performed.  Delcie Roch 03/04/2015, 10:46 AM

## 2015-03-04 NOTE — Progress Notes (Signed)
Physical Therapy Treatment Patient Details Name: [name] MRN: [id number] DOB: [birth year] Today's Date: 03/04/2015    History of Present Illness This 70 y.o. female admitted 02/27/15 with increased pain Lt hip and ankle as well as exposed hardware Lt ankle. She underwent removal of ankle hardware and application of wound VAC lt ankle 02/27/15. PMH includes Lt ankle fx 10/16 due to fall.Fall at Thanksgiving resulting in Lt hip fracture, and underwent ORIF. Opoid abuse, OCD, sepsis, anxiety, dizziness, depressive disorder, H/O MRSA     PT Comments    Patient required max encouragement to ambulate this session. Pt is progressing slowly toward mobility goals but continues to be limited by pain. Current plan remains appropriate.  Follow Up Recommendations  SNF;Supervision/Assistance - 24 hour     Equipment Recommendations  Wheelchair (measurements PT);Wheelchair cushion (measurements PT)    Recommendations for Other Services       Precautions / Restrictions Precautions Precautions: Fall Precaution Comments: two falls at home 10/16 (ankle fracture - fell in yard), 11/16 fell at daughter's (hip fracture).  Denies other falls  Required Braces or Orthoses: Other Brace/Splint Other Brace/Splint: CAM walker  Restrictions Weight Bearing Restrictions: Yes LLE Weight Bearing: Weight bearing as tolerated    Mobility  Bed Mobility               General bed mobility comments: sitting on EOB with NT upon entering room  Transfers Overall transfer level: Needs assistance Equipment used: Rolling walker (2 wheeled) Transfers: Sit to/from [employer] Sit to Stand: Min assist Stand pivot transfers: Min assist       General transfer comment: vc for hand placement and technique; min A to power up and to gain balance upon standing; during stand pivot: min A for guidance of RW and maintaining balance with verbal and tactile cues for sequencing and keeping hands on AD  throughout transfer  Ambulation/Gait Ambulation/Gait assistance: Min guard;Min assist Ambulation Distance (Feet): 8 Feet Assistive device: Rolling walker (2 wheeled) Gait Pattern/deviations: Step-to pattern;Decreased step length - right;Decreased stance time - left;Antalgic;Trunk flexed     General Gait Details: max vc for sequencing of gait with use of AD, position of RW, and posture; pt with tendency to step with L before bringing R foot forward for BOS; pt required max encouragement to ambulate to chair from [facility]   Stairs            Wheelchair Mobility    Modified Rankin (Stroke Patients Only)       Balance Overall balance assessment: Needs assistance Sitting-balance support: Single extremity supported;Feet supported Sitting balance-Leahy Scale: Fair     Standing balance support: Bilateral upper extremity supported Standing balance-Leahy Scale: Poor                      Cognition Arousal/Alertness: Awake/alert Behavior During Therapy: Anxious;Flat affect Overall Cognitive Status: Within Functional Limits for tasks assessed                      Exercises      General Comments General comments (skin integrity, edema, etc.): pt required max encouragement to ambulate this session; pt refused further mobility due to pain      Pertinent Vitals/Pain Pain Assessment: 0-10 Pain Score: 8  Pain Location: L LE Pain Descriptors / Indicators: Sore Pain Intervention(s): Limited activity within patient's tolerance;Monitored during session;Premedicated before session;Repositioned    Home Living  Prior Function            PT Goals (current goals can now be found in the care plan section) Acute Rehab PT Goals Patient Stated Goal: to go home Progress towards PT goals: Progressing toward goals    Frequency  Min 3X/week    PT Plan Current plan remains appropriate    Co-evaluation             End of Session  Equipment Utilized During Treatment: Gait belt Activity Tolerance: Patient limited by pain Patient left: in chair;with call bell/phone within reach     Time: 1154-1210 PT Time Calculation (min) (ACUTE ONLY): 16 min  Charges:  $Gait Training: 8-22 mins                    G Codes:      Derek Mound, PTA Pager: [phone]   03/04/2015, 12:25 PM

## 2015-03-04 NOTE — Clinical Social Work Note (Addendum)
Clinical Social Work Assessment  Patient Details  Name: [name] MRN: [id number] Date of Birth: 09-28-1945  Date of referral:  03/04/15               Reason for consult:  Facility Placement                Permission sought to share information with:  Facility Medical sales representative, Family Supports Permission granted to share information::  Yes, Verbal Permission Granted  Name::      (son Mellody Dance and daughter [name])  Agency::   [facility] SNFs- [facility] first choice)  Relationship::     Contact Information:     Housing/Transportation Living arrangements for the past 2 months:  Single Family Home Source of Information:  Patient Patient Interpreter Needed:  None Criminal Activity/Legal Involvement Pertinent to Current Situation/Hospitalization:  No - Comment as needed Significant Relationships:  Adult Children Lives with:    Do you feel safe going back to the place where you live?  Yes Need for family participation in patient care:  Yes (Comment) (Daughter requests to be involved patient agreeable )  Care giving concerns:  Daughter unable to provide appropriate care at time of discharge with wound vac and iv abx.   Social Worker assessment / plan:  CSW received notification that daughter reports she cannot manage patient at home with projected wound vac and IV abx needed at time of discharge.  Daughter has changed her mind therefore, disposition has changed to SNF at this time.  Daughter requesting [facility] SNF.  CSW will initiate the SNF/STR process.    Employment status:  Retired Health and safety inspector:  Medicare PT Recommendations:    Information / Referral to community resources:  Skilled Nursing Facility  Patient/Family's Response to care:  Patient and family agreeable  Patient/Family's Understanding of and Emotional Response to Diagnosis, Current Treatment, and Prognosis:  Daughter and patient are realistic regarding level of care  needed at time of discharge.  Emotional Assessment Appearance:  Appears older than stated age Attitude/Demeanor/Rapport:   (appropriate) Affect (typically observed):  Accepting Orientation:  Oriented to Self, Oriented to Place, Oriented to  Time, Oriented to Situation Alcohol / Substance use:  Not Applicable Psych involvement (Current and /or in the community):  No (Comment)  Discharge Needs  Concerns to be addressed:  No discharge needs identified Readmission within the last 30 days:  No Current discharge risk:  None Barriers to Discharge:  Continued Medical Work up   [employer], LCSW 03/04/2015, 10:22 AM

## 2015-03-04 NOTE — Progress Notes (Addendum)
[place] for Infectious Disease  abtx day : # 6 cefazolin Reason for visit: Follow up on  Post op infection with hardware removal  Interval History: reamins afebrile. tmax of 100.1 yesterday. Underwent 2D echo this morning. Feeling better, less pain to left ankle   Physical Exam: Constitutional:  Filed Vitals:   03/03/15 2013 03/04/15 0002  BP: 153/61 143/52  Pulse: 120 118  Temp: 100.1 F (37.8 C) 99.5 F (37.5 C)  Resp: 17 18  Physical Exam  Constitutional:  oriented to person, place, and time. appears well-developed and well-nourished. No distress.  HENT: [place]/AT, PERRLA, no scleral icterus Mouth/Throat: Oropharynx is clear and moist. No oropharyngeal exudate.  Cardiovascular: Normal rate, regular rhythm and normal heart sounds. Exam reveals no gallop and no friction rub.  No murmur heard.  Pulmonary/Chest: Effort normal and breath sounds normal. No respiratory distress.  has no wheezes.  Neck = supple, no nuchal rigidity Skin: right upper arm picc line c/d/i Ext: left foot in boot with vac  Psychiatric: a normal mood and affect.  behavior is normal.    Review of Systems:   Lab Results  Component Value Date   WBC 7.6 03/04/2015   HGB 7.7* 03/04/2015   HCT 23.7* 03/04/2015   MCV 87.8 03/04/2015   PLT 465* 03/04/2015    Lab Results  Component Value Date   CREATININE 0.72 03/03/2015   BUN <5* 03/03/2015   NA 137 03/03/2015   K 4.1 03/03/2015   CL 103 03/03/2015   CO2 26 03/03/2015    Lab Results  Component Value Date   ALT 16 03/03/2015   AST 48* 03/03/2015   ALKPHOS 610* 03/03/2015     Microbiology: Recent Results (from the past 240 hour(s))  Culture, blood (Routine X 2) w Reflex to ID Panel     Status: None   Collection Time: 02/27/15 10:45 AM  Result Value Ref Range Status   Specimen Description BLOOD RIGHT FOREARM  Final   Special Requests BOTTLES DRAWN AEROBIC AND ANAEROBIC 5CCS  Final   Culture  Setup Time   Final    GRAM POSITIVE COCCI  IN CLUSTERS IN BOTH AEROBIC AND ANAEROBIC BOTTLES CRITICAL RESULT CALLED TO, READ BACK BY AND VERIFIED WITH: S WHITEHORN,RN'@0330'  02/28/15 MKELLY    Culture STAPHYLOCOCCUS AUREUS  Final   Report Status 03/02/2015 FINAL  Final   Organism ID, Bacteria STAPHYLOCOCCUS AUREUS  Final      Susceptibility   Staphylococcus aureus - MIC*    CIPROFLOXACIN <=0.5 SENSITIVE Sensitive     ERYTHROMYCIN <=0.25 SENSITIVE Sensitive     GENTAMICIN <=0.5 SENSITIVE Sensitive     OXACILLIN 0.5 SENSITIVE Sensitive     TETRACYCLINE <=1 SENSITIVE Sensitive     VANCOMYCIN 1 SENSITIVE Sensitive     TRIMETH/SULFA <=10 SENSITIVE Sensitive     CLINDAMYCIN <=0.25 SENSITIVE Sensitive     RIFAMPIN <=0.5 SENSITIVE Sensitive     Inducible Clindamycin NEGATIVE Sensitive     * STAPHYLOCOCCUS AUREUS  Culture, blood (Routine X 2) w Reflex to ID Panel     Status: None   Collection Time: 02/27/15 10:50 AM  Result Value Ref Range Status   Specimen Description BLOOD RIGHT HAND  Final   Special Requests BOTTLES DRAWN AEROBIC AND ANAEROBIC 5CCS  Final   Culture  Setup Time   Final    GRAM POSITIVE COCCI IN CLUSTERS IN PAIRS IN BOTH AEROBIC AND ANAEROBIC BOTTLES CRITICAL RESULT CALLED TO, READ BACK BY AND VERIFIED WITH: S  WHITEHORN'@0156'  02/28/15 MKELLY    Culture   Final    STAPHYLOCOCCUS AUREUS SUSCEPTIBILITIES PERFORMED ON PREVIOUS CULTURE WITHIN THE LAST 5 DAYS.    Report Status 03/02/2015 FINAL  Final  Anaerobic culture     Status: None (Preliminary result)   Collection Time: 02/27/15  7:11 PM  Result Value Ref Range Status   Specimen Description ABSCESS LEFT ANKLE  Final   Special Requests PATIENT ON FOLLOWING VANC  Final   Gram Stain   Final    RARE WBC PRESENT, PREDOMINANTLY PMN NO SQUAMOUS EPITHELIAL CELLS SEEN NO ORGANISMS SEEN Performed at [facility]    Culture   Final    NO ANAEROBES ISOLATED; CULTURE IN PROGRESS FOR 5 DAYS Performed at [facility]    Report Status PENDING  Incomplete   Culture, routine-abscess     Status: None   Collection Time: 02/27/15  7:11 PM  Result Value Ref Range Status   Specimen Description ABSCESS LEFT ANKLE  Final   Special Requests PATIENT ON FOLLOWING VANC  Final   Gram Stain   Final    FEW WBC PRESENT,BOTH PMN AND MONONUCLEAR FEW SQUAMOUS EPITHELIAL CELLS PRESENT RARE GRAM POSITIVE COCCI IN PAIRS Performed at [facility]    Culture   Final    FEW STAPHYLOCOCCUS AUREUS Note: RIFAMPIN AND GENTAMICIN SHOULD NOT BE USED AS SINGLE DRUGS FOR TREATMENT OF STAPH INFECTIONS. Performed at [facility]    Report Status 03/02/2015 FINAL  Final   Organism ID, Bacteria STAPHYLOCOCCUS AUREUS  Final      Susceptibility   Staphylococcus aureus - MIC*    CLINDAMYCIN <=0.25 SENSITIVE Sensitive     ERYTHROMYCIN <=0.25 SENSITIVE Sensitive     GENTAMICIN <=0.5 SENSITIVE Sensitive     LEVOFLOXACIN 0.25 SENSITIVE Sensitive     OXACILLIN 0.5 SENSITIVE Sensitive     RIFAMPIN <=0.5 SENSITIVE Sensitive     TRIMETH/SULFA <=10 SENSITIVE Sensitive     VANCOMYCIN 1 SENSITIVE Sensitive     TETRACYCLINE <=1 SENSITIVE Sensitive     MOXIFLOXACIN <=0.25 SENSITIVE Sensitive     * FEW STAPHYLOCOCCUS AUREUS  Culture, blood (routine x 2)     Status: None (Preliminary result)   Collection Time: 03/01/15  6:55 AM  Result Value Ref Range Status   Specimen Description BLOOD RIGHT ANTECUBITAL  Final   Special Requests BOTTLES DRAWN AEROBIC AND ANAEROBIC 5CC  Final   Culture NO GROWTH 3 DAYS  Final   Report Status PENDING  Incomplete  Culture, blood (routine x 2)     Status: None (Preliminary result)   Collection Time: 03/01/15  7:15 AM  Result Value Ref Range Status   Specimen Description BLOOD RIGHT HAND  Final   Special Requests BOTTLES DRAWN AEROBIC AND ANAEROBIC 10CC  Final   Culture NO GROWTH 3 DAYS  Final   Report Status PENDING  Incomplete    Impression/Plan:  1. Post op infection - growing MSSA.  Repeat blood cx from 1/29 are NGTD, will  use this as day 1 of 42  Days. - can place PICC line today  - Continue with cefazolin through March 13th - Antibiotics per SNF protocol  2. Staph aureus bacteremia - from #1.  TTE read is pending.  If no concerns on TTE, no TEE indicated since she will be getting a prolonged course of antibiotics.    Home health orders listed below----------------------------------------------  Diagnosis: Post op infection, MSSA bacteremia  Culture Result: MSSA  Allergies  Allergen Reactions  .  Sulfa Antibiotics     Hives     Discharge antibiotics: Per pharmacy protocol  Cefazolin renally dosed   Duration: 6 wk  End Date: March 13th  [place] Per Protocol:  Labs weekly while on IV antibiotics: _x CBC with differential _x_ CMP _x_ CRP- last week of tx only _x_ ESR - last week of tx only   Fax weekly labs to [phone]  Clinic Follow Up Appt: 5-6 week @ [facility]  @

## 2015-03-04 NOTE — Clinical Social Work Note (Signed)
Patient will discharge today per MD order. Patient will discharge to: [facility] RN to call report prior to transportation to: [phone] Transportation: [facility]- to be scheduled after paperwork is done with patient at bedside by [facility] liaison  CSW sent discharge summary to SNF for review.    Vickii Penna, LCSW [phone]  [id number], 2S 15-16 and Psychiatric Service Line  Licensed Clinical Social Worker

## 2015-03-04 NOTE — Progress Notes (Signed)
   Subjective:  Patient reports pain as mild to moderate.  PICC placed yesterday.  Objective:   VITALS:   Filed Vitals:   03/03/15 0500 03/03/15 1300 03/03/15 2013 03/04/15 0002  BP: 164/66 139/45 153/61 143/52  Pulse: 119 103 120 118  Temp: 98 F (36.7 C) 98.4 F (36.9 C) 100.1 F (37.8 C) 99.5 F (37.5 C)  TempSrc: Oral Oral Oral Oral  Resp: Height:      Weight:      SpO2: 92% 94% 92% 92%    LLE: dressing intact. VAC intact. 2+ DP. Subjective sensory change - no different. +TA/GS/EHL.  Lab Results  Component Value Date   WBC 7.2 03/03/2015   HGB 8.7* 03/03/2015   HCT 27.5* 03/03/2015   MCV 87.6 03/03/2015   PLT 515* 03/03/2015   BMET    Component Value Date/Time   NA 137 03/03/2015 0828   NA 137 12/12/2014   K 4.1 03/03/2015 0828   CL 103 03/03/2015 0828   CO2 26 03/03/2015 0828   GLUCOSE 146* 03/03/2015 0828   BUN <5* 03/03/2015 0828   BUN 9 12/12/2014   CREATININE 0.72 03/03/2015 0828   CREATININE 1.0 12/12/2014   CALCIUM 8.5* 03/03/2015 0828   GFRNONAA >60 03/03/2015 0828   GFRAA >60 03/03/2015 0828     Assessment/Plan: 5 Days Post-Op   Active Problems:   Wound dehiscence   Tachycardia   Deep incisional surgical site infection   Wound cellulitis   Hip pain, acute   Postop check   Acute blood loss anemia   WBAT in boot Continue wound VAC until follow up in 2 weeks - high risk wound IV abx per ID: 6 weeks ancef Dispo: d/c planning   Rudra Hobbins, Cloyde Reams 03/04/2015, 5:28 AM   Samson Frederic, MD Cell [phone]

## 2015-03-05 ENCOUNTER — Encounter: Payer: Self-pay | Admitting: Internal Medicine

## 2015-03-05 ENCOUNTER — Non-Acute Institutional Stay (SKILLED_NURSING_FACILITY): Payer: Medicare Other | Admitting: Internal Medicine

## 2015-03-05 DIAGNOSIS — R5381 Other malaise: Secondary | ICD-10-CM

## 2015-03-05 DIAGNOSIS — F329 Major depressive disorder, single episode, unspecified: Secondary | ICD-10-CM

## 2015-03-05 DIAGNOSIS — A4901 Methicillin susceptible Staphylococcus aureus infection, unspecified site: Secondary | ICD-10-CM | POA: Diagnosis not present

## 2015-03-05 DIAGNOSIS — D62 Acute posthemorrhagic anemia: Secondary | ICD-10-CM

## 2015-03-05 DIAGNOSIS — K219 Gastro-esophageal reflux disease without esophagitis: Secondary | ICD-10-CM | POA: Diagnosis not present

## 2015-03-05 DIAGNOSIS — S72002S Fracture of unspecified part of neck of left femur, sequela: Secondary | ICD-10-CM

## 2015-03-05 DIAGNOSIS — T8131XS Disruption of external operation (surgical) wound, not elsewhere classified, sequela: Secondary | ICD-10-CM

## 2015-03-05 DIAGNOSIS — E46 Unspecified protein-calorie malnutrition: Secondary | ICD-10-CM

## 2015-03-05 DIAGNOSIS — I10 Essential (primary) hypertension: Secondary | ICD-10-CM

## 2015-03-05 DIAGNOSIS — F32A Depression, unspecified: Secondary | ICD-10-CM

## 2015-03-05 DIAGNOSIS — S82852S Displaced trimalleolar fracture of left lower leg, sequela: Secondary | ICD-10-CM

## 2015-03-05 DIAGNOSIS — R000 Tachycardia, unspecified: Secondary | ICD-10-CM

## 2015-03-05 LAB — BASIC METABOLIC PANEL
BUN: 10 mg/dL (ref 4–21)
Creatinine: 0.7 mg/dL (ref 0.5–1.1)
GLUCOSE: 98 mg/dL
Potassium: 4.9 mmol/L (ref 3.4–5.3)
Sodium: 138 mmol/L (ref 137–147)

## 2015-03-05 LAB — CBC AND DIFFERENTIAL
HEMATOCRIT: 27 % — AB (ref 36–46)
Hemoglobin: 8.2 g/dL — AB (ref 12.0–16.0)
PLATELETS: 527 10*3/uL — AB (ref 150–399)
WBC: 7.4 10^3/mL

## 2015-03-05 LAB — ANAEROBIC CULTURE

## 2015-03-05 LAB — HEPATIC FUNCTION PANEL
ALT: 10 U/L (ref 7–35)
AST: 67 U/L — AB (ref 13–35)
Alkaline Phosphatase: 681 U/L — AB (ref 25–125)
BILIRUBIN, TOTAL: 0.3 mg/dL

## 2015-03-05 NOTE — Progress Notes (Signed)
Patient ID: [name], female   DOB: 24-Nov-1945, 70 y.o.   MRN: [id number]     LOCATION: Facility: [facility]    PCP: [facility]   Code Status: FULL CODE  Goals of care: Advanced Directive information Advanced Directives 02/27/2015  Does patient have an advance directive? No  Copy of advanced directive(s) in chart? -  Would patient like information on creating an advanced directive? -    Extended Emergency Contact Information Primary Emergency Contact: Szychowicz,Elizabeth Address: [address]          [place], [place] [address] Johnnette Litter of [place] Phone: [phone] Relation: Daughter   Allergies  Allergen Reactions  . Sulfa Antibiotics     Hives     Chief Complaint  Patient presents with  . New Admit To SNF    New Admission     HPI:  Patient is a 70 y.o. female seen today for short term rehabilitation post hospital admission from 02/27/15-03/04/15 with infection of left ankle wound with retained hardware. She underwent debridement on 02/27/15. Blood culture grew MSSA. ID was consulted. She was started on iv antibiotics and is on cefazolin through March 9th. She had echocardiogram with no evidence of endocarditis. She also had left hip pain and had MRI to rule out septic arthritis, seen by orthopedics and this was thought to be from bursitis vs arthritis. The imaging was suggestive of well healing intertrochanteric hip fracture. She received 1 u prbc transfusion for blood loss anemia. She is seen in her room today. She complaints of being in pain at present and is due for her pain medication.   Review of Systems:  Constitutional: Negative for fever, chills, diaphoresis.  HENT: Negative for headache, congestion, nasal discharge, difficulty swallowing.   Eyes: Negative for blurred vision, double vision and discharge.  Respiratory: positive for dry cough. Negative for shortness of breath and wheezing.     Cardiovascular: Negative for chest pain, palpitations, orthopnea, leg swelling.  Gastrointestinal: Negative for heartburn, nausea, vomiting, abdominal pain. Had bowel movement yesterday Genitourinary: Negative for dysuria. Musculoskeletal: Negative for back pain, falls in the facility. Skin: Negative for itching, rash.  Neurological: Negative for dizziness, tingling. Psychiatric/Behavioral: Negative for depression.   Past Medical History  Diagnosis Date  . Chicken pox   . Chest pain   . SOB (shortness of breath) on exertion   . Fall 06/15/2011    Caused discomfort in lower left rib and left lateral hip area  . H/O: hysterectomy 1985  . Depressive disorder   . Pre-syncope   . Dizziness   . OCD (obsessive compulsive disorder)   . Anxiety   . Opioid abuse   . Hypokalemia   . Severe sepsis ([place])   . Anemia   . Azotemia   . Left trimalleolar fracture   . Respiratory failure ([place])   . History of MRSA infection   . Leukocytosis    Past Surgical History  Procedure Laterality Date  . Gallbladder surgery    . Appendectomy    . Total abdominal hysterectomy    . Intramedullary (im) nail intertrochanteric Left 12/25/2014    Procedure: INTRAMEDULLARY (IM) NAIL INTERTROCHANTRIC;  Surgeon: Wylene Simmer, MD;  Location: [facility];  Service: Orthopedics;  Laterality: Left;  . Hardware removal Left 02/27/2015    Procedure: HARDWARE REMOVAL OF ANKLE AND I AND D;  Surgeon: Rod Can, MD;  Location: [facility];  Service: Orthopedics;  Laterality: Left;  .  Application of wound vac Left 02/27/2015    Procedure: APPLICATION OF WOUND VAC;  Surgeon: Rod Can, MD;  Location: [facility];  Service: Orthopedics;  Laterality: Left;   Social History:   reports that she has never smoked. She does not have any smokeless tobacco history on file. She reports that she does not drink alcohol or use illicit drugs.  Family History  Problem Relation Age of Onset  . Heart attack Mother   . Colon cancer Father   .  Diabetes Brother   . Heart attack Brother 61    MI    Medications:   Medication List       This list is accurate as of: 03/05/15 12:51 PM.  Always use your most recent med list.               aspirin 325 MG tablet  Take 325 mg by mouth daily.     calcium carbonate 1500 (600 Ca) MG Tabs tablet  Commonly known as:  OSCAL  Take 600 mg of elemental calcium by mouth daily with breakfast.     ceFAZolin 2-3 GM-% Solr  Commonly known as:  ANCEF  Inject 50 mLs (2 g total) into the vein every 8 (eight) hours.     metoprolol tartrate 25 MG tablet  Commonly known as:  LOPRESSOR  Take 1 tablet (25 mg total) by mouth 2 (two) times daily.     oxyCODONE 5 MG immediate release tablet  Commonly known as:  Oxy IR/ROXICODONE  Take 1-2 tablets (5-10 mg total) by mouth every 4 (four) hours as needed for moderate pain.     pantoprazole 40 MG tablet  Commonly known as:  PROTONIX  Take 40 mg by mouth daily. Before breakfast     sertraline 100 MG tablet  Commonly known as:  ZOLOFT  Take 100 mg by mouth daily.        Immunizations: Immunization History  Administered Date(s) Administered  . Influenza-Unspecified 12/23/2014  . PPD Test 12/09/2014  . Pneumococcal-Unspecified 12/23/2014     Physical Exam: Filed Vitals:   03/05/15 1130  BP: 155/65  Pulse: 102  Temp: 98.3 F (36.8 C)  TempSrc: Oral  Resp: 20  Height: 5' 2" (1.575 m)  Weight: 117 lb (53.071 kg)  SpO2: 97%   Body mass index is 21.39 kg/(m^2).  General- elderly female, thin built no acute distress Head- normocephalic, atraumatic Nose- no maxillary or frontal sinus tenderness, no nasal discharge Throat- moist mucus membrane Eyes- PERRLA, EOMI, no pallor, no icterus, no discharge, normal conjunctiva, normal sclera Neck- no cervical lymphadenopathy Cardiovascular- normal s1,s2, no murmurs, tachycardic, palpable dorsalis pedis and radial pulses, no leg edema Respiratory- bilateral clear to auscultation, no wheeze,  no rhonchi, no crackles, no use of accessory muscles Abdomen- bowel sounds present, soft, non tender Musculoskeletal- able to move all 4 extremities, left leg ROM limited, left lateral ankle area surgical incision healing well with sutures in place, no drainage, mild erythema present, good circulation noted, limited ROM to left hip, ankle brace present Neurological- no focal deficit, alert and oriented to person, place and time Skin- warm and dry, RUE PICC line site clean and dry Psychiatry- normal mood and affect    Labs reviewed: Basic Metabolic Panel:  Recent Labs  03/01/15 0655 03/02/15 0710 03/03/15 0828  NA 138 138 137  K 3.4* 4.2 4.1  CL 102 105 103  CO2 _0 GLUCOSE 102* 116* 146*  BUN 8 5* <5*  CREATININE  0.73 0.69 0.72  CALCIUM 8.2* 8.3* 8.5*   Liver Function Tests:  Recent Labs  03/01/15 0655 03/02/15 0710 03/03/15 0828  AST 57* 98* 48*  ALT 43 32 16  ALKPHOS 715* 645* 610*  BILITOT 0.6 0.6 0.4  PROT 5.3* 5.6* 5.5*  ALBUMIN 1.8* 1.8* 1.8*   No results for input(s): LIPASE, AMYLASE in the last 8760 hours. No results for input(s): AMMONIA in the last 8760 hours. CBC:  Recent Labs  05/18/14 1322  12/25/14 1313  02/27/15 0939  03/02/15 0710 03/03/15 0828 03/04/15 0520  WBC 8.0  < > 12.3*  < > 9.8  < > 8.0 7.2 7.6  NEUTROABS 6.8  --  10.6*  --  8.4*  --   --   --   --   HGB 11.2*  < > 11.6*  < > 9.8*  < > 8.6* 8.7* 7.7*  HCT 33.2*  < > 35.0*  < > 29.8*  < > 26.3* 27.5* 23.7*  MCV 90.5  < > 90.0  < > 87.1  < > 87.1 87.6 87.8  PLT 224  < > 361  < > 534*  < > 449* 515* 465*  < > = values in this interval not displayed. Cardiac Enzymes:  Recent Labs  08/07/14 2130 08/08/14 0020 08/08/14 0219  TROPONINI <0.03 <0.03 <0.03   BNP: Invalid input(s): POCBNP CBG:  Recent Labs  08/07/14 2057 08/08/14 0726 08/08/14 1142  GLUCAP 122* 95 95    Radiological Exams: Dg Chest 1 View  02/27/2015  CLINICAL DATA:  Shortness of breath with  exertion. EXAM: CHEST 1 VIEW COMPARISON:  February 11, 2015 chest radiograph; chest CT December 25, 2014 FINDINGS: There is scarring in the right upper lobe and right base region. There is also scarring in the left base region. There is an ill-defined nodular opacity in the left upper lobe measuring 1.6 x 1.6 cm. No edema or consolidation. Heart size and pulmonary vascularity are normal. No adenopathy. No bone lesions. IMPRESSION: Areas of scarring, stable. Ill-defined nodular opacity left upper lobe measuring 1.6 x 1.6 cm. On prior CT, there was consolidation in this area. It is possible that this current nodular opacity in the left upper lobe represents residua from that infiltrate. An underlying mass which remains is a differential consideration, however, and given this possibility, noncontrast enhanced chest CT at this time is warranted. Lungs elsewhere clear.  No change in cardiac silhouette. Electronically Signed   By: William  Woodruff III M.D.   On: 02/27/2015 10:52   Dg Ankle Complete Left  02/27/2015  CLINICAL DATA:  Redness swelling and pain about the left ankle, status post ORIF in October 2016. EXAM: LEFT ANKLE COMPLETE - 3+ VIEW COMPARISON:  None. FINDINGS: There has been a prior tri malleolar fracture fixation. There is diffuse osteopenia. The distal tibial screws are in good position. The lateral plate and screw fixation of the distal fibula is also with satisfactory radiologic positioning. There is no evidence of acute fracture. There is no evidence of radiographically apparent loosening. There is mild callus formation about the fracture lines. There is bilateral ankle soft tissue swelling. No soft tissue emphysema. IMPRESSION: Status post tri malleolar fracture fixation with normal appearance of the orthopedic hardware. Osteopenia. Mild callus formation at the fracture lines. No evidence of acute fracture. Bimalleolar soft tissue swelling without evidence of soft tissue emphysema. Electronically  Signed   By: Dobrinka  Dimitrova M.D.   On: 02/27/2015 10:49   Mr Hip Left   W Wo Contrast  03/01/2015  CLINICAL DATA:  Multiple falls in the last several months causing left ankle fracture and left hip fracture. 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 EXAM: MRI OF THE LEFT HIP WITHOUT AND WITH CONTRAST TECHNIQUE: Multiplanar, multisequence MR imaging was performed both before and after administration of intravenous contrast. CONTRAST:  10mL MULTIHANCE GADOBENATE DIMEGLUMINE 529 MG/ML IV SOLN COMPARISON:  02/27/2015 FINDINGS: Field heterogeneity due to the hip implant. Bones: Indistinct original intertrochanteric fracture traversed by the IM nail and screw. There is some low-level edema in the left hip intertrochanteric region and tracking along the stem of the implant. No new fracture identified. Articular cartilage and labrum Articular cartilage: Moderate degenerative chondral thinning in the left hip joint. Labrum:  Indeterminate due to motion artifact and metal artifact. Joint or bursal effusion Joint effusion:  Trace left hip joint effusion. Bursae: Left iliopsoas bursitis, tracking cephalad beneath the left iliacus muscle. Mild left trochanteric bursitis, collection 5.7 by 4.4 by 8.2 cm. Muscles and tendons Muscles and tendons: Generalized muscular edema but most confluent in the gluteus medius muscles, and hip adductor musculature. The adductor musculature on the left is more edematous than on the right. Hamstring tendons intact. There is edema in the left vastus intermedius and lateralis muscles as on image 35 series 4. Low-grade edema and enhancement in the lower paraspinal musculature. Other findings Miscellaneous: Mild mesenteric and presacral edema in the pelvis. Diffuse subcutaneous edema. There is no edema or enhancement in the sciatic nerves. No impingement at the sciatic notch. A left kidney lower pole cyst is suspected. SI joints normal. IMPRESSION: 1. Diffuse third spacing of fluid with  subcutaneous, muscular, mesenteric, and presacral edema, and faint diffuse accentuated muscular enhancement in the pelvis and upper thighs. This is most confluent in the hip adductor musculature (left greater than right), the gluteus medius muscles, and the left upper vastus lateralis and intermedius muscles. Strictly speaking I cannot exclude infectious myositis although the generalized appearance suggests a more systemic cause for third spacing of fluid. 2. There is left trochanteric and iliopsoas bursitis but no bursitis on the right. The iliopsoas bursitis track cephalad beneath the iliacus muscle. Upper normal amount of fluid in the left hip joint. 3. Very low-level edema along the original intertrochanteric fracture and tracking along the stem of the implant which may still be due to the recent implant placement and healing fracture. Electronically Signed   By: Walter  Liebkemann M.D.   On: 03/01/2015 12:12   Ct Ankle Left Wo Contrast  02/27/2015  CLINICAL DATA:  Left foot pain with redness and exudate. Protruding fixation screws. Prior open reduction and internal fixation of fractures of the distal tibia and fibula. EXAM: CT OF THE LEFT ANKLE WITHOUT CONTRAST TECHNIQUE: Multidetector CT imaging of the left ankle was performed according to the standard protocol. Multiplanar CT image reconstructions were also generated. COMPARISON:  Radiographs dated 02/27/2015 FINDINGS: There are old healed fractures of distal tibia and fibula. 3 screws in the distal tibia are in good position. Side plate and multiple screws appear in good position in the distal tibia. No evidence of loosening of the screws. There is soft tissue edema anterior to the distal tibia and anterior lateral aspect of the proximal talus. No bone destruction to suggest osteomyelitis. No acute fractures. The tendons around the ankle appear normal. There is no ankle or subtalar joint effusion. The skin is quite thin over the second and third screws in  the distal fibula counting from proximal   to distal. These may be the exposed screw heads. IMPRESSION: No acute osseous abnormality. Slight soft tissue edema adjacent to the distal fibula and at the anterior lateral aspect the proximal talus just anterior to the fibula. No loosening of the hardware. The heads of the second and third screws in the distal tibia may be exposed due to break down of overlying skin. Electronically Signed   By: Lorriane Shire M.D.   On: 02/27/2015 15:34   Dg Ankle Left Port  02/27/2015  CLINICAL DATA:  Status post hardware removal with incision and drainage of left ankle. EXAM: PORTABLE LEFT ANKLE - 2 VIEW COMPARISON:  02/27/2015, earlier same day. FINDINGS: Interval retrieval of lateral fibular plate evident. Cannulated compression screws remain in the distal tibia, as before. Lateral soft tissue swelling is evident. Bones are diffusely demineralized. Vacuum device overlies the lateral soft tissues. IMPRESSION: Status post retrieval of tibial plate Electronically Signed   By: Misty Stanley M.D.   On: 02/27/2015 20:33   Dg Hip Unilat With Pelvis 2-3 Views Left  02/27/2015  CLINICAL DATA:  Left hip surgery in November. Throbbing left hip pain medially. EXAM: DG HIP (WITH OR WITHOUT PELVIS) 2-3V LEFT COMPARISON:  12/25/2014 FINDINGS: Remote left intertrochanteric hip fracture transfixed with a intra medullary nail and interlocking cannulated femoral neck screw. Ununited displaced lesser trochanteric fracture fragment. Subtle lucency in the proximal left femur in the region of the lesser trochanter fracture which may be artifactual secondary to overlapping lesser trochanteric fracture fragment versus incomplete healing. No other fracture or dislocation. No hardware failure or complication. Mild osteoarthritis of the left hip. IMPRESSION: 1. Remote left intertrochanteric hip fracture transfixed with a intra medullary nail and interlocking cannulated femoral neck screw. Ununited displaced  lesser trochanteric fracture fragment. Subtle lucency in the proximal left femur in the region of the lesser trochanter fracture which may be artifactual secondary to overlapping lesser trochanteric fracture fragment versus incomplete healing. Electronically Signed   By: Kathreen Devoid   On: 02/27/2015 10:51    Assessment/Plan  1. Physical deconditioning Will have her work ith physical therapy and occupational therapy team to help with gait training and muscle strengthening exercises.fall precautions. Skin care. Encourage to be out of bed.   2. MSSA (methicillin susceptible Staphylococcus aureus) infection Afebrile. Continue and complete cefazolin through 04/13/15. Reviewed ID note. Check cbc and cmp weekly and check ESR and CRP on 04/08/15. Monitor temperature curve. Continue PICC line care. Will need f/u with ID in 5 weeks.  3. Acute blood loss anemia S/p 1 unit blood transfusion. Check cbc   4. Tachycardia With her increased heart rate, change lopressor to 37.5 mg bid for now. Check bp and HR q shift for 1 week, then bid for a week.   5. Protein-calorie malnutrition ([place]) Get dietary consult to assess for protein supplement, weekly weight check.   6. Wound dehiscence, sequela S/p debridement and sutures. Will need wound care and wound vac placement.   7. Trimalleolar fracture, left, sequela S/p surgery and post op wound infection, now s/p debridement. Will have her work with therapy team. Has f/u with orthopedics. Continue oxyIR 5 mg 1-2 tab q4h prn pain. Continue aspirin 325 mg daily for dvt prophylaxis. Continue oscal   8. Closed left hip fracture, sequela To work with therapy team for gait training. Fall precautions, pain medication as above.   9. Essential hypertension Elevated BP, check bp q shift as above and made changes to lopressor as above. Monitor BMP  10.  Chronic depression Continue zoloft  11. Gastroesophageal reflux disease, esophagitis presence not specified Stable,  continue protonix  Goals of care: short term rehabilitation   Labs/tests ordered: cbc and cmp weekly, CRP and ESR 04/08/15.   Family/ staff Communication: reviewed care plan with patient and nursing supervisor    MAHIMA PANDEY, MD Internal Medicine [facility] [address] [place], [place] [address] Cell Phone (Monday-Friday 8 am - 5 pm): [phone] On Call: [phone] and follow prompts after 5 pm and on weekends Office Phone: [phone] Office Fax: [phone]   

## 2015-03-06 LAB — CULTURE, BLOOD (ROUTINE X 2)
CULTURE: NO GROWTH
CULTURE: NO GROWTH

## 2015-03-09 ENCOUNTER — Ambulatory Visit: Payer: Medicare Other

## 2015-03-09 LAB — BASIC METABOLIC PANEL
BUN: 8 mg/dL (ref 4–21)
CREATININE: 0.6 mg/dL (ref 0.5–1.1)
GLUCOSE: 96 mg/dL
POTASSIUM: 5 mmol/L (ref 3.4–5.3)
Sodium: 142 mmol/L (ref 137–147)

## 2015-03-09 LAB — HEPATIC FUNCTION PANEL
ALT: 4 U/L — AB (ref 7–35)
AST: 34 U/L (ref 13–35)
Alkaline Phosphatase: 457 U/L — AB (ref 25–125)
Bilirubin, Total: 0.2 mg/dL

## 2015-03-09 LAB — CBC AND DIFFERENTIAL
HCT: 30 % — AB (ref 36–46)
Hemoglobin: 9.1 g/dL — AB (ref 12.0–16.0)
Platelets: 649 10*3/uL — AB (ref 150–399)
WBC: 7.2 10*3/mL

## 2015-03-18 ENCOUNTER — Encounter: Payer: Self-pay | Admitting: Nurse Practitioner

## 2015-03-18 ENCOUNTER — Non-Acute Institutional Stay (SKILLED_NURSING_FACILITY): Payer: Medicare Other | Admitting: Nurse Practitioner

## 2015-03-18 DIAGNOSIS — E46 Unspecified protein-calorie malnutrition: Secondary | ICD-10-CM

## 2015-03-18 DIAGNOSIS — F32A Depression, unspecified: Secondary | ICD-10-CM

## 2015-03-18 DIAGNOSIS — S82852S Displaced trimalleolar fracture of left lower leg, sequela: Secondary | ICD-10-CM

## 2015-03-18 DIAGNOSIS — R000 Tachycardia, unspecified: Secondary | ICD-10-CM | POA: Diagnosis not present

## 2015-03-18 DIAGNOSIS — D62 Acute posthemorrhagic anemia: Secondary | ICD-10-CM

## 2015-03-18 DIAGNOSIS — F329 Major depressive disorder, single episode, unspecified: Secondary | ICD-10-CM

## 2015-03-18 DIAGNOSIS — K219 Gastro-esophageal reflux disease without esophagitis: Secondary | ICD-10-CM

## 2015-03-18 DIAGNOSIS — A4901 Methicillin susceptible Staphylococcus aureus infection, unspecified site: Secondary | ICD-10-CM | POA: Diagnosis not present

## 2015-03-18 NOTE — Progress Notes (Signed)
Patient ID: MIGUEL MEDAL, female   DOB: April 15, 1945, 70 y.o.   MRN: [id number]    Nursing Home Location:  [place] of Service: SNF (31)  PCP: [facility]  Allergies  Allergen Reactions  . Sulfa Antibiotics     Hives     Chief Complaint  Patient presents with  . Discharge Note    Discharge from facility    HPI:  Patient is a 70 y.o. female seen today at [facility] for discharge home. Pt with a pmh of anxiety/depression, OA, GERD. Pt here for rehab after hospitalization from 02/27/15-03/04/15 with infection of left ankle wound with retained hardware. She underwent debridement on 02/27/15. Blood culture grew MSSA. ID was consulted. She was started on IV antibiotics and is on cefazolin through March 13th. Patient currently doing well with therapy, now stable to discharge home with the help of her daughter on antibiotics and with home health.  Review of Systems:  Review of Systems  Constitutional: Negative for activity change, appetite change, fatigue and unexpected weight change.  HENT: Negative for congestion and hearing loss.   Eyes: Negative.   Respiratory: Negative for cough and shortness of breath.   Cardiovascular: Negative for chest pain, palpitations and leg swelling.  Gastrointestinal: Negative for abdominal pain, diarrhea and constipation.  Genitourinary: Negative for dysuria and difficulty urinating.  Musculoskeletal: Positive for myalgias and arthralgias.       Pain controlled with medication  Skin: Negative for color change and wound.  Neurological: Negative for dizziness and weakness.  Psychiatric/Behavioral: Negative for behavioral problems, confusion and agitation.    Past Medical History  Diagnosis Date  . Chicken pox   . Chest pain   . SOB (shortness of breath) on exertion   . Fall 06/15/2011    Caused discomfort in lower left rib and left lateral hip area  . H/O: hysterectomy 1985  .  Depressive disorder   . Pre-syncope   . Dizziness   . OCD (obsessive compulsive disorder)   . Anxiety   . Opioid abuse   . Hypokalemia   . Severe sepsis ([place])   . Anemia   . Azotemia   . Left trimalleolar fracture   . Respiratory failure ([place])   . History of MRSA infection   . Leukocytosis    Past Surgical History  Procedure Laterality Date  . Gallbladder surgery    . Appendectomy    . Total abdominal hysterectomy    . Intramedullary (im) nail intertrochanteric Left 12/25/2014    Procedure: INTRAMEDULLARY (IM) NAIL INTERTROCHANTRIC;  Surgeon: Wylene Simmer, MD;  Location: [place];  Service: Orthopedics;  Laterality: Left;  . Hardware removal Left 02/27/2015    Procedure: HARDWARE REMOVAL OF ANKLE AND I AND D;  Surgeon: Rod Can, MD;  Location: [place];  Service: Orthopedics;  Laterality: Left;  . Application of wound vac Left 02/27/2015    Procedure: APPLICATION OF WOUND VAC;  Surgeon: Rod Can, MD;  Location: [place];  Service: Orthopedics;  Laterality: Left;   Social History:   reports that she has never smoked. She does not have any smokeless tobacco history on file. She reports that she does not drink alcohol or use illicit drugs.  Family History  Problem Relation Age of Onset  . Heart attack Mother   . Colon cancer Father   . Diabetes Brother   . Heart attack Brother 42    MI  Medications: Patient's Medications  New Prescriptions   No medications on file  Previous Medications   AMINO ACIDS-PROTEIN HYDROLYS (FEEDING SUPPLEMENT, PRO-STAT SUGAR FREE 64,) LIQD    Take 30 mLs by mouth daily.   ASPIRIN 325 MG TABLET    Take 325 mg by mouth daily.   CALCIUM CARBONATE (OSCAL) 1500 (600 CA) MG TABS TABLET    Take 600 mg of elemental calcium by mouth daily with breakfast.   CEFAZOLIN (ANCEF) 2-3 GM-% SOLR    Inject 50 mLs (2 g total) into the vein every 8 (eight) hours.   DOCUSATE SODIUM (COLACE) 100 MG CAPSULE    Take 100 mg by mouth daily.   FERROUS SULFATE 325 (65  FE) MG TABLET    Take 325 mg by mouth daily with breakfast.   METOPROLOL TARTRATE (LOPRESSOR) 25 MG TABLET    Take 1 tablet (25 mg total) by mouth 2 (two) times daily.   PANTOPRAZOLE (PROTONIX) 40 MG TABLET    Take 40 mg by mouth daily. Before breakfast   SERTRALINE (ZOLOFT) 100 MG TABLET    Take 100 mg by mouth daily.   SODIUM CHLORIDE 0.9 % SOLN    Use syringe to flush PICC line with 10 mls before and after IV ABT every 8 hours using S.A.S.H. Method.   VITAMIN C (ASCORBIC ACID) 500 MG TABLET    Take 500 mg by mouth 2 (two) times daily. Take for 30 days for wound healing.  Modified Medications   No medications on file  Discontinued Medications   OXYCODONE (OXY IR/ROXICODONE) 5 MG IMMEDIATE RELEASE TABLET    Take 1-2 tablets (5-10 mg total) by mouth every 4 (four) hours as needed for moderate pain.     Physical Exam: Filed Vitals:   03/18/15 1114  BP: 134/63  Pulse: 76  Temp: 98.4 F (36.9 C)  TempSrc: Oral  Resp: 20  Height: '5\' 2"'  (1.575 m)  Weight: 117 lb (53.071 kg)    Physical Exam  Constitutional: She is oriented to person, place, and time. She appears well-developed and well-nourished. No distress.  HENT:  Head: Normocephalic and atraumatic.  Mouth/Throat: Oropharynx is clear and moist. No oropharyngeal exudate.  Eyes: Conjunctivae are normal. Pupils are equal, round, and reactive to light.  Neck: Normal range of motion. Neck supple.  Cardiovascular: Normal rate, regular rhythm and normal heart sounds.   Pulmonary/Chest: Effort normal and breath sounds normal.  Abdominal: Soft. Bowel sounds are normal.  Musculoskeletal: She exhibits no edema or tenderness.  Well healed right ankle incision, conts with ace wrap and boot  Neurological: She is alert and oriented to person, place, and time.  Skin: Skin is warm and dry. She is not diaphoretic.  Psychiatric: She has a normal mood and affect.    Labs reviewed: Basic Metabolic Panel:  Recent Labs  03/01/15 0655  03/02/15 0710 03/03/15 0828 03/05/15 03/09/15  NA 138 138 137 138 142  K 3.4* 4.2 4.1 4.9 5.0  CL 102 105 103  --   --   CO2 '27 27 26  ' --   --   GLUCOSE 102* 116* 146*  --   --   BUN 8 5* <5* 10 8  CREATININE 0.73 0.69 0.72 0.7 0.6  CALCIUM 8.2* 8.3* 8.5*  --   --    Liver Function Tests:  Recent Labs  03/01/15 0655 03/02/15 0710 03/03/15 0828 03/05/15 03/09/15  AST 57* 98* 48* 67* 34  ALT 43 32 16 10 4*  ALKPHOS  715* 645* 610* 681* 457*  BILITOT 0.6 0.6 0.4  --   --   PROT 5.3* 5.6* 5.5*  --   --   ALBUMIN 1.8* 1.8* 1.8*  --   --    No results for input(s): LIPASE, AMYLASE in the last 8760 hours. No results for input(s): AMMONIA in the last 8760 hours. CBC:  Recent Labs  05/18/14 1322  12/25/14 1313  02/27/15 0939  03/02/15 0710 03/03/15 0828 03/04/15 0520 03/05/15 03/09/15  WBC 8.0  < > 12.3*  < > 9.8  < > 8.0 7.2 7.6 7.4 7.2  NEUTROABS 6.8  --  10.6*  --  8.4*  --   --   --   --   --   --   HGB 11.2*  < > 11.6*  < > 9.8*  < > 8.6* 8.7* 7.7* 8.2* 9.1*  HCT 33.2*  < > 35.0*  < > 29.8*  < > 26.3* 27.5* 23.7* 27* 30*  MCV 90.5  < > 90.0  < > 87.1  < > 87.1 87.6 87.8  --   --   PLT 224  < > 361  < > 534*  < > 449* 515* 465* 527* 649*  < > = values in this interval not displayed. TSH:  Recent Labs  02/28/15 1138  TSH 0.959   A1C: Lab Results  Component Value Date   HGBA1C 5.3 08/07/2014   Lipid Panel:  Recent Labs  08/07/14 2130  CHOL 152  HDL 76  LDLCALC 69  TRIG 36  CHOLHDL 2.0    Radiological Exams: Dg Chest 1 View  02/27/2015  CLINICAL DATA:  Shortness of breath with exertion. EXAM: CHEST 1 VIEW COMPARISON:  February 11, 2015 chest radiograph; chest CT December 25, 2014 FINDINGS: There is scarring in the right upper lobe and right base region. There is also scarring in the left base region. There is an ill-defined nodular opacity in the left upper lobe measuring 1.6 x 1.6 cm. No edema or consolidation. Heart size and pulmonary vascularity are  normal. No adenopathy. No bone lesions. IMPRESSION: Areas of scarring, stable. Ill-defined nodular opacity left upper lobe measuring 1.6 x 1.6 cm. On prior CT, there was consolidation in this area. It is possible that this current nodular opacity in the left upper lobe represents residua from that infiltrate. An underlying mass which remains is a differential consideration, however, and given this possibility, noncontrast enhanced chest CT at this time is warranted. Lungs elsewhere clear.  No change in cardiac silhouette. Electronically Signed   By: Lowella Grip III M.D.   On: 02/27/2015 10:52   Dg Ankle Complete Left  02/27/2015  CLINICAL DATA:  Redness swelling and pain about the left ankle, status post ORIF in October 2016. EXAM: LEFT ANKLE COMPLETE - 3+ VIEW COMPARISON:  None. FINDINGS: There has been a prior tri malleolar fracture fixation. There is diffuse osteopenia. The distal tibial screws are in good position. The lateral plate and screw fixation of the distal fibula is also with satisfactory radiologic positioning. There is no evidence of acute fracture. There is no evidence of radiographically apparent loosening. There is mild callus formation about the fracture lines. There is bilateral ankle soft tissue swelling. No soft tissue emphysema. IMPRESSION: Status post tri malleolar fracture fixation with normal appearance of the orthopedic hardware. Osteopenia. Mild callus formation at the fracture lines. No evidence of acute fracture. Bimalleolar soft tissue swelling without evidence of soft tissue emphysema. Electronically Signed  By: Fidela Salisbury M.D.   On: 02/27/2015 10:49   Mr Hip Left W Wo Contrast  03/01/2015  CLINICAL DATA:  Multiple falls in the last several months causing left ankle fracture and left hip fracture. 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 EXAM: MRI OF THE LEFT HIP WITHOUT AND WITH CONTRAST TECHNIQUE: Multiplanar, multisequence MR imaging was performed  both before and after administration of intravenous contrast. CONTRAST:  56m MULTIHANCE GADOBENATE DIMEGLUMINE 529 MG/ML IV SOLN COMPARISON:  02/27/2015 FINDINGS: Field heterogeneity due to the hip implant. Bones: Indistinct original intertrochanteric fracture traversed by the IM nail and screw. There is some low-level edema in the left hip intertrochanteric region and tracking along the stem of the implant. No new fracture identified. Articular cartilage and labrum Articular cartilage: Moderate degenerative chondral thinning in the left hip joint. Labrum:  Indeterminate due to motion artifact and metal artifact. Joint or bursal effusion Joint effusion:  Trace left hip joint effusion. Bursae: Left iliopsoas bursitis, tracking cephalad beneath the left iliacus muscle. Mild left trochanteric bursitis, collection 5.7 by 4.4 by 8.2 cm. Muscles and tendons Muscles and tendons: Generalized muscular edema but most confluent in the gluteus medius muscles, and hip adductor musculature. The adductor musculature on the left is more edematous than on the right. Hamstring tendons intact. There is edema in the left vastus intermedius and lateralis muscles as on image 35 series 4. Low-grade edema and enhancement in the lower paraspinal musculature. Other findings Miscellaneous: Mild mesenteric and presacral edema in the pelvis. Diffuse subcutaneous edema. There is no edema or enhancement in the sciatic nerves. No impingement at the sciatic notch. A left kidney lower pole cyst is suspected. SI joints normal. IMPRESSION: 1. Diffuse third spacing of fluid with subcutaneous, muscular, mesenteric, and presacral edema, and faint diffuse accentuated muscular enhancement in the pelvis and upper thighs. This is most confluent in the hip adductor musculature (left greater than right), the gluteus medius muscles, and the left upper vastus lateralis and intermedius muscles. Strictly speaking I cannot exclude infectious myositis although the  generalized appearance suggests a more systemic cause for third spacing of fluid. 2. There is left trochanteric and iliopsoas bursitis but no bursitis on the right. The iliopsoas bursitis track cephalad beneath the iliacus muscle. Upper normal amount of fluid in the left hip joint. 3. Very low-level edema along the original intertrochanteric fracture and tracking along the stem of the implant which may still be due to the recent implant placement and healing fracture. Electronically Signed   By: WVan ClinesM.D.   On: 03/01/2015 12:12   Ct Ankle Left Wo Contrast  02/27/2015  CLINICAL DATA:  Left foot pain with redness and exudate. Protruding fixation screws. Prior open reduction and internal fixation of fractures of the distal tibia and fibula. EXAM: CT OF THE LEFT ANKLE WITHOUT CONTRAST TECHNIQUE: Multidetector CT imaging of the left ankle was performed according to the standard protocol. Multiplanar CT image reconstructions were also generated. COMPARISON:  Radiographs dated 02/27/2015 FINDINGS: There are old healed fractures of distal tibia and fibula. 3 screws in the distal tibia are in good position. Side plate and multiple screws appear in good position in the distal tibia. No evidence of loosening of the screws. There is soft tissue edema anterior to the distal tibia and anterior lateral aspect of the proximal talus. No bone destruction to suggest osteomyelitis. No acute fractures. The tendons around the ankle appear normal. There is no ankle or subtalar joint effusion. The skin is  quite thin over the second and third screws in the distal fibula counting from proximal to distal. These may be the exposed screw heads. IMPRESSION: No acute osseous abnormality. Slight soft tissue edema adjacent to the distal fibula and at the anterior lateral aspect the proximal talus just anterior to the fibula. No loosening of the hardware. The heads of the second and third screws in the distal tibia may be exposed  due to break down of overlying skin. Electronically Signed   By: Lorriane Shire M.D.   On: 02/27/2015 15:34   Dg Ankle Left Port  02/27/2015  CLINICAL DATA:  Status post hardware removal with incision and drainage of left ankle. EXAM: PORTABLE LEFT ANKLE - 2 VIEW COMPARISON:  02/27/2015, earlier same day. FINDINGS: Interval retrieval of lateral fibular plate evident. Cannulated compression screws remain in the distal tibia, as before. Lateral soft tissue swelling is evident. Bones are diffusely demineralized. Vacuum device overlies the lateral soft tissues. IMPRESSION: Status post retrieval of tibial plate Electronically Signed   By: Misty Stanley M.D.   On: 02/27/2015 20:33   Dg Hip Unilat With Pelvis 2-3 Views Left  02/27/2015  CLINICAL DATA:  Left hip surgery in November. Throbbing left hip pain medially. EXAM: DG HIP (WITH OR WITHOUT PELVIS) 2-3V LEFT COMPARISON:  12/25/2014 FINDINGS: Remote left intertrochanteric hip fracture transfixed with a intra medullary nail and interlocking cannulated femoral neck screw. Ununited displaced lesser trochanteric fracture fragment. Subtle lucency in the proximal left femur in the region of the lesser trochanter fracture which may be artifactual secondary to overlapping lesser trochanteric fracture fragment versus incomplete healing. No other fracture or dislocation. No hardware failure or complication. Mild osteoarthritis of the left hip. IMPRESSION: 1. Remote left intertrochanteric hip fracture transfixed with a intra medullary nail and interlocking cannulated femoral neck screw. Ununited displaced lesser trochanteric fracture fragment. Subtle lucency in the proximal left femur in the region of the lesser trochanter fracture which may be artifactual secondary to overlapping lesser trochanteric fracture fragment versus incomplete healing. Electronically Signed   By: Kathreen Devoid   On: 02/27/2015 10:51    Assessment/Plan 1. MSSA (methicillin susceptible  Staphylococcus aureus) infection -Continue and complete IV cefazolin through 04/13/15 via PICC line -to follow up with ID on 04/15/15.  -will need follow up cbc, CMP, ESR and CRP on 04/08/15. By PCP  2. Acute blood loss anemia Required transfusion in hospital. hgb trending up. conts on iron daily. Will need follow up CBC by PCP  3. Tachycardia - remains stable, conts on lopressor 37.5 mg BID.   4. Trimalleolar fracture, left, sequela S/p surgery and post op wound infection, now s/p debridement. Would vac dc'd yesterday at ortho appt. well healed.  Cont follow up  with orthopedics.  Continue oxyIR 5 mg 1-2 tab q4h prn pain.  Continue aspirin 325 mg daily for dvt prophylaxis.  Continue oscal   5. Protein-calorie malnutrition ([place]) -cont supplements outpt, will need ongoing follow up by PCP  6. Chronic depression Stable, conts on zoloft   7. Gastroesophageal reflux disease, esophagitis presence not specified -stable, conts on protonix   pt is stable for discharge-will need PT/OT/Nursing per home health. No DME needed. Rx written.  will need to follow up with PCP within 2 weeks.  Will be going home with daughter to help with IV antibiotics and nursing Pt already scheduled with ID for follow up.   Carlos American. Harle Battiest  [facility] [phone] 8 am - 5  pm) [phone] (after hours)

## 2015-03-20 DIAGNOSIS — T84623A Infection and inflammatory reaction due to internal fixation device of left tibia, initial encounter: Secondary | ICD-10-CM

## 2015-03-20 DIAGNOSIS — S72142D Displaced intertrochanteric fracture of left femur, subsequent encounter for closed fracture with routine healing: Secondary | ICD-10-CM | POA: Diagnosis not present

## 2015-03-20 DIAGNOSIS — T84625A Infection and inflammatory reaction due to internal fixation device of left fibula, initial encounter: Secondary | ICD-10-CM | POA: Diagnosis not present

## 2015-03-20 DIAGNOSIS — M150 Primary generalized (osteo)arthritis: Secondary | ICD-10-CM | POA: Diagnosis not present

## 2015-03-30 ENCOUNTER — Ambulatory Visit
Admit: 2015-03-30 | Discharge: 2015-03-30 | Disposition: A | Discharge status: Home / Self Care | Payer: Medicare Other | Attending: Physician Assistant | Admitting: Physician Assistant

## 2015-03-30 DIAGNOSIS — J90 Pleural effusion, not elsewhere classified: Secondary | ICD-10-CM | POA: Insufficient documentation

## 2015-03-30 DIAGNOSIS — N281 Cyst of kidney, acquired: Secondary | ICD-10-CM | POA: Insufficient documentation

## 2015-03-30 DIAGNOSIS — N2889 Other specified disorders of kidney and ureter: Secondary | ICD-10-CM | POA: Diagnosis present

## 2015-03-30 MED ORDER — GADOBENATE DIMEGLUMINE 529 MG/ML IV SOLN
10.0000 mL | Freq: Once | INTRAVENOUS | Status: AC | PRN
  Administered 2015-03-30: 10 mL via INTRAVENOUS

## 2015-04-15 ENCOUNTER — Ambulatory Visit (INDEPENDENT_AMBULATORY_CARE_PROVIDER_SITE_OTHER): Payer: Medicare Other | Admitting: Internal Medicine

## 2015-04-15 ENCOUNTER — Encounter: Payer: Self-pay | Admitting: Internal Medicine

## 2015-04-15 VITALS — BP 191/76 | HR 114 | Temp 97.4°F | Wt 118.0 lb

## 2015-04-15 DIAGNOSIS — M86172 Other acute osteomyelitis, left ankle and foot: Secondary | ICD-10-CM

## 2015-04-15 MED ORDER — CEPHALEXIN 500 MG PO TABS: 500.0000 mg | ORAL_TABLET | Freq: Three times a day (TID) | ORAL | Status: DC

## 2015-04-15 NOTE — Progress Notes (Signed)
Rfv: follow up to left ankle HW infection Subjective:    Patient ID: [name], female    DOB: 06/06/1945, 70 y.o.   MRN: [id number]  HPI  70yo F with recent hospitalization for MSSA left ankle HW infection plus associated bacteremia. She was treated with 6 wk of cefazolin which finished on 3/13. Her last sed rate this week is 40 down from 116 at time of infection. She states that she is doing well otherwise.  Current Outpatient Prescriptions on File Prior to Visit  Medication Sig Dispense Refill  . Amino Acids-Protein Hydrolys (FEEDING SUPPLEMENT, PRO-STAT SUGAR FREE 64,) LIQD Take 30 mLs by mouth daily.    Marland Kitchen. aspirin 325 MG tablet Take 325 mg by mouth daily.    . calcium carbonate (OSCAL) 1500 (600 Ca) MG TABS tablet Take 600 mg of elemental calcium by mouth daily with breakfast.    . ceFAZolin (ANCEF) 2-3 GM-% SOLR Inject 50 mLs (2 g total) into the vein every 8 (eight) hours. 50 mL 0  . docusate sodium (COLACE) 100 MG capsule Take 100 mg by mouth daily.    . ferrous sulfate 325 (65 FE) MG tablet Take 325 mg by mouth daily with breakfast.    . metoprolol tartrate (LOPRESSOR) 25 MG tablet Take 1 tablet (25 mg total) by mouth 2 (two) times daily.    . pantoprazole (PROTONIX) 40 MG tablet Take 40 mg by mouth daily. Before breakfast    . sertraline (ZOLOFT) 100 MG tablet Take 100 mg by mouth daily.    . sodium chloride 0.9 % SOLN Use syringe to flush PICC line with 10 mls before and after IV ABT every 8 hours using S.A.S.H. Method.    . vitamin C (ASCORBIC ACID) 500 MG tablet Take 500 mg by mouth 2 (two) times daily. Take for 30 days for wound healing.     No current facility-administered medications on file prior to visit.   Active Ambulatory Problems    Diagnosis Date Noted  . Angina decubitus (HCC) 07/20/2011  . Palpitations 07/20/2011  . Hypercholesterolemia 07/20/2011  . Family history of coronary artery disease 07/20/2011  . Hypoxia 05/18/2014  . Chest pain 08/07/2014  .  Anemia 08/07/2014  . AKI (acute kidney injury) (HCC) 08/07/2014  . Hyperglycemia 08/07/2014  . Hyponatremia 08/07/2014  . Left Trimalleolar fracture with displacement of the ankle joint S/P ORIF 11/26/2014  . Essential hypertension 11/26/2014  . Encounter for central line care 11/26/2014  . HCAP (healthcare-associated pneumonia) 11/26/2014  . Leukocytosis 11/26/2014  . Chronic depression 11/26/2014  . Esophageal reflux 11/26/2014  . Protein-calorie malnutrition (HCC) 11/26/2014  . Sinus tachycardia (HCC) 12/16/2014  . Right ventricular hypertrophy 12/16/2014  . History of diastolic dysfunction 12/25/2014  . Closed left hip fracture (HCC) 12/25/2014  . History of sinus tachycardia 12/25/2014  . Anxiety 12/25/2014  . PNA (pneumonia) 12/25/2014  . Abnormal chest x-ray   . Fracture, intertrochanteric, left femur (HCC) 12/25/2014  . Wound dehiscence 02/27/2015  . Tachycardia 02/27/2015  . Deep incisional surgical site infection 02/27/2015  . Wound cellulitis   . Hip pain, acute   . Postop check   . Acute blood loss anemia 03/01/2015  . Staphylococcus aureus bacteremia   . Postoperative infection    Resolved Ambulatory Problems    Diagnosis Date Noted  . No Resolved Ambulatory Problems   Past Medical History  Diagnosis Date  . Chicken pox   . SOB (shortness of breath) on exertion   . Fall 06/15/2011  .  H/O: hysterectomy 1985  . Depressive disorder   . Pre-syncope   . Dizziness   . OCD (obsessive compulsive disorder)   . Opioid abuse   . Hypokalemia   . Severe sepsis (HCC)   . Azotemia   . Left trimalleolar fracture   . Respiratory failure (HCC)   . History of MRSA infection    Social History  Substance Use Topics  . Smoking status: Never Smoker   . Smokeless tobacco: None  . Alcohol Use: No  family history includes Colon cancer in her father; Diabetes in her brother; Heart attack in her mother; Heart attack (age of onset: 92) in her brother.  Review of  Systems  Constitutional: Negative for fever, chills, diaphoresis, activity change, appetite change, fatigue and unexpected weight change.  HENT: Negative for congestion, sore throat, rhinorrhea, sneezing, trouble swallowing and sinus pressure.  Eyes: Negative for photophobia and visual disturbance.  Respiratory: Negative for cough, chest tightness, shortness of breath, wheezing and stridor.  Cardiovascular: Negative for chest pain, palpitations and leg swelling.  Gastrointestinal: Negative for nausea, vomiting, abdominal pain, diarrhea, constipation, blood in stool, abdominal distention and anal bleeding.  Genitourinary: Negative for dysuria, hematuria, flank pain and difficulty urinating.  Musculoskeletal: left ankle slight swelling Skin: Negative for color change, pallor, rash and wound.  Neurological: Negative for dizziness, tremors, weakness and light-headedness.  Hematological: Negative for adenopathy. Does not bruise/bleed easily.  Psychiatric/Behavioral: Negative for behavioral problems, confusion, sleep disturbance, dysphoric mood, decreased concentration and agitation.       Objective:   Physical Exam BP 191/76 mmHg  Pulse 114  Temp(Src) 97.4 F (36.3 C) (Oral)  Wt 118 lb (53.524 kg) Physical Exam  Constitutional:  oriented to person, place, and time. appears well-developed and well-nourished. No distress.  HENT: /AT, PERRLA, no scleral icterus, op clear no thrush Ext - full range of motion with left ankle Skin = well healed incision. No warmth of fluctuation, still wrapped/wearing bracke Psychiatric: a normal mood and affect.  behavior is normal.   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

## 2015-04-15 NOTE — Progress Notes (Signed)
Per Dr Snider 37cm  Single lumen Peripherally Inserted Central Catheter  removed from right basilic . No sutures present. Dressing was clean and dry . Area cleansed with chlorhexidine and petroleum dressing applied. Pt advised no heavy lifting with this arm, leave dressing for 24 hours and call the office if dressing becomes soaked with blood or sharp pain presents.  Pt tolerated procedure well.    Sayyid Harewood K Asma Boldon, RN  

## 2015-07-07 ENCOUNTER — Ambulatory Visit: Payer: Medicare Other | Admitting: Internal Medicine

## 2015-11-01 HISTORY — PX: ANKLE FRACTURE SURGERY: SHX122

## 2015-11-03 ENCOUNTER — Encounter (HOSPITAL_COMMUNITY): Payer: Self-pay

## 2015-11-03 ENCOUNTER — Emergency Department (HOSPITAL_COMMUNITY): Payer: Medicare HMO

## 2015-11-03 ENCOUNTER — Emergency Department (HOSPITAL_COMMUNITY)
Admission: EM | Admit: 2015-11-03 | Discharge: 2015-11-03 | Disposition: A | Discharge status: Home / Self Care | Payer: Medicare HMO | Attending: Emergency Medicine | Admitting: Emergency Medicine

## 2015-11-03 DIAGNOSIS — R0789 Other chest pain: Secondary | ICD-10-CM | POA: Diagnosis not present

## 2015-11-03 DIAGNOSIS — I10 Essential (primary) hypertension: Secondary | ICD-10-CM | POA: Diagnosis not present

## 2015-11-03 DIAGNOSIS — Z7982 Long term (current) use of aspirin: Secondary | ICD-10-CM | POA: Insufficient documentation

## 2015-11-03 DIAGNOSIS — R072 Precordial pain: Secondary | ICD-10-CM | POA: Diagnosis present

## 2015-11-03 LAB — BASIC METABOLIC PANEL
Anion gap: 11 (ref 5–15)
BUN: 13 mg/dL (ref 6–20)
CALCIUM: 9.1 mg/dL (ref 8.9–10.3)
CHLORIDE: 100 mmol/L — AB (ref 101–111)
CO2: 22 mmol/L (ref 22–32)
CREATININE: 0.99 mg/dL (ref 0.44–1.00)
GFR calc Af Amer: >60 mL/min (ref >60)
GFR, EST NON AFRICAN AMERICAN: 56 mL/min — AB (ref >60)
Glucose, Bld: 117 mg/dL — ABNORMAL HIGH (ref 65–99)
Potassium: 4.2 mmol/L (ref 3.5–5.1)
SODIUM: 133 mmol/L — AB (ref 135–145)

## 2015-11-03 LAB — CBC WITH DIFFERENTIAL/PLATELET
BASOS ABS: 0 10*3/uL (ref 0.0–0.1)
BASOS PCT: 1 %
EOS PCT: 1 %
Eosinophils Absolute: 0 10*3/uL (ref 0.0–0.7)
HEMATOCRIT: 31.3 % — AB (ref 36.0–46.0)
Hemoglobin: 9.9 g/dL — ABNORMAL LOW (ref 12.0–15.0)
LYMPHS PCT: 22 %
Lymphs Abs: 1.8 10*3/uL (ref 0.7–4.0)
MCH: 32 pg (ref 26.0–34.0)
MCHC: 31.6 g/dL (ref 30.0–36.0)
MCV: 101.3 fL — AB (ref 78.0–100.0)
MONO ABS: 0.8 10*3/uL (ref 0.1–1.0)
MONOS PCT: 10 %
Neutro Abs: 5.3 10*3/uL (ref 1.7–7.7)
Neutrophils Relative %: 66 %
PLATELETS: 364 10*3/uL (ref 150–400)
RBC: 3.09 MIL/uL — ABNORMAL LOW (ref 3.87–5.11)
RDW: 14.2 % (ref 11.5–15.5)
WBC: 8 10*3/uL (ref 4.0–10.5)

## 2015-11-03 LAB — I-STAT TROPONIN, ED
Troponin i, poc: 0 ng/mL (ref 0.00–0.08)
Troponin i, poc: 0 ng/mL (ref 0.00–0.08)

## 2015-11-03 LAB — MAGNESIUM: MAGNESIUM: 2.2 mg/dL (ref 1.7–2.4)

## 2015-11-03 MED ORDER — MORPHINE SULFATE (PF) 2 MG/ML IV SOLN
2.0000 mg | Freq: Once | INTRAVENOUS | Status: AC
  Administered 2015-11-03: 2 mg via INTRAVENOUS
  Filled 2015-11-03: qty 1

## 2015-11-03 NOTE — ED Notes (Signed)
Pt ambulated to the bathroom w standby assist. 

## 2015-11-03 NOTE — ED Notes (Signed)
Patient transported to X-ray 

## 2015-11-03 NOTE — ED Provider Notes (Signed)
[facility]-EMERGENCY DEPT Provider Note   CSN: [id number] Arrival date & time: 11/03/15  1653     History   Chief Complaint Chief Complaint  Patient presents with  . Chest Pain    HPI [name] is a 70 y.o. female.  The history is provided by the patient and a relative (daughter).  Chest Pain   This is a new problem. The current episode started less than 1 hour ago. The problem occurs constantly. The problem has not changed since onset.The pain is associated with rest (was sitting watching TV at onset). The pain is present in the substernal region. The pain is severe. The quality of the pain is described as pressure-like. The pain does not radiate. Associated symptoms include shortness of breath. Pertinent negatives include no abdominal pain, no back pain, no claudication, no cough, no diaphoresis, no dizziness, no exertional chest pressure, no fever, no headaches, no leg pain, no lower extremity edema, no malaise/fatigue, no nausea, no numbness, no vomiting and no weakness.  Pertinent negatives for past medical history include no CAD, no cancer, no CHF, no DVT and no PE.  Procedure history is positive for stress echo. Past workup comments: pt had nml stress test in 2013.    Past Medical History:  Diagnosis Date  . Anemia   . Anxiety   . Azotemia   . Chest pain   . Chicken pox   . Depressive disorder   . Dizziness   . Fall 06/15/2011   Caused discomfort in lower left rib and left lateral hip area  . H/O: hysterectomy 1985  . History of MRSA infection   . Hypokalemia   . Left trimalleolar fracture   . Leukocytosis   . OCD (obsessive compulsive disorder)   . Opioid abuse   . Pre-syncope   . Respiratory failure (HCC)   . Severe sepsis (HCC)   . SOB (shortness of breath) on exertion     Patient Active Problem List   Diagnosis Date Noted  . Staphylococcus aureus bacteremia   . Postoperative infection   . Acute blood loss anemia 03/01/2015  . Hip pain, acute   . Postop  check   . Wound cellulitis   . Wound dehiscence 02/27/2015  . Tachycardia 02/27/2015  . Deep incisional surgical site infection 02/27/2015  . History of diastolic dysfunction 12/25/2014  . Closed left hip fracture (HCC) 12/25/2014  . History of sinus tachycardia 12/25/2014  . Anxiety 12/25/2014  . PNA (pneumonia) 12/25/2014  . Fracture, intertrochanteric, left femur (HCC) 12/25/2014  . Abnormal chest x-ray   . Sinus tachycardia 12/16/2014  . Right ventricular hypertrophy 12/16/2014  . Left Trimalleolar fracture with displacement of the ankle joint S/P ORIF 11/26/2014  . Essential hypertension 11/26/2014  . Encounter for central line care 11/26/2014  . HCAP (healthcare-associated pneumonia) 11/26/2014  . Leukocytosis 11/26/2014  . Chronic depression 11/26/2014  . Esophageal reflux 11/26/2014  . Protein-calorie malnutrition (HCC) 11/26/2014  . Chest pain 08/07/2014  . Anemia 08/07/2014  . AKI (acute kidney injury) (HCC) 08/07/2014  . Hyperglycemia 08/07/2014  . Hyponatremia 08/07/2014  . Hypoxia 05/18/2014  . Angina decubitus (HCC) 07/20/2011  . Palpitations 07/20/2011  . Hypercholesterolemia 07/20/2011  . Family history of coronary artery disease 07/20/2011    Past Surgical History:  Procedure Laterality Date  . APPENDECTOMY    . APPLICATION OF WOUND VAC Left 02/27/2015   Procedure: APPLICATION OF WOUND VAC;  Surgeon: Samson Frederic, MD;  Location: [facility] OR;  Service: Orthopedics;  Laterality:  Left;  . GALLBLADDER SURGERY    . HARDWARE REMOVAL Left 02/27/2015   Procedure: HARDWARE REMOVAL OF ANKLE AND I AND D;  Surgeon: Samson Frederic, MD;  Location: [facility] OR;  Service: Orthopedics;  Laterality: Left;  . INTRAMEDULLARY (IM) NAIL INTERTROCHANTERIC Left 12/25/2014   Procedure: INTRAMEDULLARY (IM) NAIL INTERTROCHANTRIC;  Surgeon: Toni Arthurs, MD;  Location: [facility] OR;  Service: Orthopedics;  Laterality: Left;  . TOTAL ABDOMINAL HYSTERECTOMY      OB History    No data available        Home Medications    Prior to Admission medications   Medication Sig Start Date End Date Taking? Authorizing Provider  aspirin 325 MG tablet Take 325 mg by mouth daily.   Yes Historical Provider, MD  calcium carbonate (OSCAL) 1500 (600 Ca) MG TABS tablet Take 600 mg of elemental calcium by mouth daily with breakfast.   Yes Historical Provider, MD  lisinopril (PRINIVIL,ZESTRIL) 10 MG tablet Take 10 mg by mouth daily.   Yes Historical Provider, MD  metoprolol tartrate (LOPRESSOR) 25 MG tablet Take 1 tablet (25 mg total) by mouth 2 (two) times daily. 03/04/15  Yes Rhetta Mura, MD  Multiple Vitamin (MULTIVITAMIN WITH MINERALS) TABS tablet Take 1 tablet by mouth daily.   Yes Historical Provider, MD  pantoprazole (PROTONIX) 40 MG tablet Take 40 mg by mouth daily. Before breakfast   Yes Historical Provider, MD  sertraline (ZOLOFT) 100 MG tablet Take 100 mg by mouth daily.   Yes Historical Provider, MD  ceFAZolin (ANCEF) 2-3 GM-% SOLR Inject 50 mLs (2 g total) into the vein every 8 (eight) hours. Patient not taking: Reported on 11/03/2015 03/04/15   Rhetta Mura, MD  Cephalexin 500 MG tablet Take 1 tablet (500 mg total) by mouth 3 (three) times daily. Patient not taking: Reported on 11/03/2015 04/15/15   Judyann Munson, MD    Family History Family History  Problem Relation Age of Onset  . Heart attack Mother   . Colon cancer Father   . Diabetes Brother   . Heart attack Brother 66    MI    Social History Social History  Substance Use Topics  . Smoking status: Never Smoker  . Smokeless tobacco: Never Used  . Alcohol use No     Allergies   Sulfa antibiotics   Review of Systems Review of Systems  Constitutional: Negative for diaphoresis, fever and malaise/fatigue.  HENT: Negative for congestion.   Eyes: Negative for visual disturbance.  Respiratory: Positive for shortness of breath. Negative for cough.   Cardiovascular: Positive for chest pain. Negative for  claudication.  Gastrointestinal: Negative for abdominal pain, nausea and vomiting.  Genitourinary: Negative for flank pain.  Musculoskeletal: Negative for back pain.  Skin: Negative for rash.  Neurological: Negative for dizziness, weakness, numbness and headaches.  Psychiatric/Behavioral: Negative for confusion.     Physical Exam Updated Vital Signs BP (!) 134/54   Pulse 66   Temp 98.7 F (37.1 C) (Oral)   Resp 19   Ht 5' 2.5" (1.588 m)   Wt 52.6 kg   SpO2 100%   BMI 20.88 kg/m   Physical Exam  Constitutional: She is oriented to person, place, and time. She appears well-developed and well-nourished. No distress.  Pleasant, cooperative, well-appearing  HENT:  Head: Normocephalic and atraumatic.  Eyes: Conjunctivae are normal. No scleral icterus.  Neck: Normal range of motion. Neck supple. No JVD present.  Cardiovascular: Normal rate and regular rhythm.  Exam reveals no gallop and no friction  rub.   No murmur heard. Pulmonary/Chest: Effort normal and breath sounds normal. No respiratory distress. She exhibits no tenderness.  Abdominal: Soft. She exhibits no distension. There is no tenderness.  Musculoskeletal: She exhibits no edema or tenderness.  Symmetric size and appearance of b/l Le's, no calf swelling or tenderness  Neurological: She is alert and oriented to person, place, and time. She exhibits normal muscle tone. Coordination normal.  Skin: Skin is warm and dry. Capillary refill takes less than 2 seconds. No rash noted. She is not diaphoretic.  Psychiatric: She has a normal mood and affect.  Nursing note and vitals reviewed.    ED Treatments / Results  Labs (all labs ordered are listed, but only abnormal results are displayed) Labs Reviewed  BASIC METABOLIC PANEL - Abnormal; Notable for the following:       Result Value   Sodium 133 (*)    Chloride 100 (*)    Glucose, Bld 117 (*)    GFR calc non Af Amer 56 (*)    All other components within normal limits   CBC WITH DIFFERENTIAL/PLATELET - Abnormal; Notable for the following:    RBC 3.09 (*)    Hemoglobin 9.9 (*)    HCT 31.3 (*)    MCV 101.3 (*)    All other components within normal limits  MAGNESIUM  I-STAT TROPOININ, ED  I-STAT TROPOININ, ED    EKG  EKG Interpretation None       Radiology Dg Chest 2 View  Result Date: 11/03/2015 CLINICAL DATA:  Chest pain and shortness of breath EXAM: CHEST  2 VIEW COMPARISON:  Chest radiograph 02/27/2015 FINDINGS: Cardiomediastinal contours are unchanged. Areas of scarring at both lung bases are also unchanged. No focal consolidation or pulmonary edema. No pleural effusion or pneumothorax. Nodular opacity near the left lung base is slightly more prominent, but also likely represent scarring. IMPRESSION: Minimal change in pattern of lower lobe predominant opacities, favored to represent scarring, with slightly increased conspicuity of nodular opacity in the left lower lobe. No focal airspace consolidation. Electronically Signed   By: Deatra Robinson M.D.   On: 11/03/2015 18:32    Procedures Procedures (including critical care time)  Medications Ordered in ED Medications  morphine 2 MG/ML injection 2 mg (2 mg Intravenous Given 11/03/15 1805)     Initial Impression / Assessment and Plan / ED Course  I have reviewed the triage vital signs and the nursing notes.  Pertinent labs & imaging results that were available during my care of the patient were reviewed by me and considered in my medical decision making (see chart for details).  Clinical Course   YARIELIZ WASSER is a 70 y.o. female with h/o normal stress test in 2013, no known CAD or other cardiac dz, never smoker, no h/o DVT/PE, who presents to ED via EMS from home where she had sudden-onset of severe left-sided non-radiating chest pressure with dyspnea, no nausea, no fatigue, no diaphoresis. Pt had full aspirin today and NTG en route without relief. Delta trop negative. Doubt ACS. Doubt  dissection. Doubt Pe. Unclear etiology of Cp, but resolved after 2mg  IV morphine without reoccurance. Shared decision making with pt and daughter to d/c home with PCP follow-up this week, would advise outpatient stress test. Advised to return to ER for any reoccurance, new, worse, or concerning symptoms. They demonstrate understanding of this and comfort and desire for d/c home.  Pt condition, course, and discharge were discussed with attending physician Dr. Margarita Grizzle.  Final Clinical Impressions(s) / ED Diagnoses   Final diagnoses:  Other chest pain    New Prescriptions Discharge Medication List as of 11/03/2015 11:27 PM       Horald PollenAudrey Rashidi Loh, MD 11/04/15 [id number]    Margarita Grizzleanielle Ray, MD 11/04/15 1555

## 2015-11-03 NOTE — ED Triage Notes (Signed)
Per EMS, Pt is coming from home with complaints of left-sided chest pain that started today while she was sitting on the porch. Pt reports some SOB with initial chest pain. Denies dizziness, N/V/D. Reports bilateral tingling in her toes. Vitals per EMS: 150/78, 78 HR

## 2015-12-15 ENCOUNTER — Emergency Department (HOSPITAL_COMMUNITY): Payer: Medicare HMO

## 2015-12-15 ENCOUNTER — Observation Stay (HOSPITAL_COMMUNITY)
Admission: EM | Admit: 2015-12-15 | Discharge: 2015-12-16 | Disposition: A | Discharge status: Home / Self Care | Payer: Medicare HMO | Attending: Internal Medicine | Admitting: Internal Medicine

## 2015-12-15 ENCOUNTER — Encounter (HOSPITAL_COMMUNITY): Payer: Self-pay | Admitting: Emergency Medicine

## 2015-12-15 DIAGNOSIS — E7800 Pure hypercholesterolemia, unspecified: Secondary | ICD-10-CM | POA: Diagnosis present

## 2015-12-15 DIAGNOSIS — R079 Chest pain, unspecified: Secondary | ICD-10-CM

## 2015-12-15 DIAGNOSIS — I10 Essential (primary) hypertension: Secondary | ICD-10-CM | POA: Diagnosis present

## 2015-12-15 DIAGNOSIS — N179 Acute kidney failure, unspecified: Secondary | ICD-10-CM | POA: Diagnosis present

## 2015-12-15 DIAGNOSIS — K219 Gastro-esophageal reflux disease without esophagitis: Secondary | ICD-10-CM | POA: Diagnosis not present

## 2015-12-15 DIAGNOSIS — R0789 Other chest pain: Secondary | ICD-10-CM | POA: Diagnosis not present

## 2015-12-15 DIAGNOSIS — Z7982 Long term (current) use of aspirin: Secondary | ICD-10-CM | POA: Diagnosis not present

## 2015-12-15 DIAGNOSIS — E875 Hyperkalemia: Secondary | ICD-10-CM | POA: Diagnosis present

## 2015-12-15 DIAGNOSIS — Z79899 Other long term (current) drug therapy: Secondary | ICD-10-CM | POA: Insufficient documentation

## 2015-12-15 HISTORY — DX: Essential (primary) hypertension: I10

## 2015-12-15 HISTORY — DX: Unspecified osteoarthritis, unspecified site: M19.90

## 2015-12-15 HISTORY — DX: Gastro-esophageal reflux disease without esophagitis: K21.9

## 2015-12-15 LAB — BASIC METABOLIC PANEL
ANION GAP: 11 (ref 5–15)
BUN: 25 mg/dL — AB (ref 6–20)
CALCIUM: 9.1 mg/dL (ref 8.9–10.3)
CO2: 21 mmol/L — ABNORMAL LOW (ref 22–32)
Chloride: 105 mmol/L (ref 101–111)
Creatinine, Ser: 1.34 mg/dL — ABNORMAL HIGH (ref 0.44–1.00)
GFR calc Af Amer: 45 mL/min — ABNORMAL LOW (ref >60)
GFR, EST NON AFRICAN AMERICAN: 39 mL/min — AB (ref >60)
GLUCOSE: 109 mg/dL — AB (ref 65–99)
POTASSIUM: 5.2 mmol/L — AB (ref 3.5–5.1)
SODIUM: 137 mmol/L (ref 135–145)

## 2015-12-15 LAB — I-STAT TROPONIN, ED: TROPONIN I, POC: 0 ng/mL (ref 0.00–0.08)

## 2015-12-15 LAB — CBC
HEMATOCRIT: 34.3 % — AB (ref 36.0–46.0)
HEMOGLOBIN: 11.1 g/dL — AB (ref 12.0–15.0)
MCH: 31.4 pg (ref 26.0–34.0)
MCHC: 32.4 g/dL (ref 30.0–36.0)
MCV: 97.2 fL (ref 78.0–100.0)
Platelets: 270 10*3/uL (ref 150–400)
RBC: 3.53 MIL/uL — ABNORMAL LOW (ref 3.87–5.11)
RDW: 12.5 % (ref 11.5–15.5)
WBC: 7.6 10*3/uL (ref 4.0–10.5)

## 2015-12-15 LAB — TROPONIN I

## 2015-12-15 MED ORDER — SODIUM POLYSTYRENE SULFONATE 15 GM/60ML PO SUSP
30.0000 g | Freq: Once | ORAL | Status: AC
  Administered 2015-12-15: 30 g via ORAL
  Filled 2015-12-15: qty 120

## 2015-12-15 MED ORDER — HYDRALAZINE HCL 20 MG/ML IJ SOLN: 5.0000 mg | INTRAMUSCULAR | Status: DC | PRN

## 2015-12-15 MED ORDER — SODIUM POLYSTYRENE SULFONATE 15 GM/60ML PO SUSP: 15.0000 g | Freq: Once | ORAL | Status: DC

## 2015-12-15 MED ORDER — METOPROLOL TARTRATE 25 MG PO TABS
25.0000 mg | ORAL_TABLET | Freq: Two times a day (BID) | ORAL | Status: DC
  Administered 2015-12-16: 25 mg via ORAL
  Filled 2015-12-15: qty 1

## 2015-12-15 MED ORDER — ASPIRIN 325 MG PO TABS
325.0000 mg | ORAL_TABLET | Freq: Every day | ORAL | Status: DC
  Administered 2015-12-16: 325 mg via ORAL
  Filled 2015-12-15: qty 1

## 2015-12-15 MED ORDER — CALCIUM CARBONATE 1500 (600 CA) MG PO TABS
600.0000 mg | ORAL_TABLET | Freq: Every day | ORAL | Status: DC
  Filled 2015-12-15: qty 1

## 2015-12-15 MED ORDER — INSULIN ASPART 100 UNIT/ML ~~LOC~~ SOLN
5.0000 [IU] | Freq: Once | SUBCUTANEOUS | Status: AC
  Administered 2015-12-15: 5 [IU] via SUBCUTANEOUS
  Filled 2015-12-15: qty 1

## 2015-12-15 MED ORDER — ALPRAZOLAM 0.25 MG PO TABS: 0.2500 mg | ORAL_TABLET | Freq: Two times a day (BID) | ORAL | Status: DC | PRN

## 2015-12-15 MED ORDER — NITROGLYCERIN 0.4 MG SL SUBL: 0.4000 mg | SUBLINGUAL_TABLET | SUBLINGUAL | Status: DC | PRN

## 2015-12-15 MED ORDER — ZOLPIDEM TARTRATE 5 MG PO TABS
5.0000 mg | ORAL_TABLET | Freq: Every evening | ORAL | Status: DC | PRN
  Administered 2015-12-15: 5 mg via ORAL
  Filled 2015-12-15: qty 1

## 2015-12-15 MED ORDER — ENOXAPARIN SODIUM 40 MG/0.4ML ~~LOC~~ SOLN
40.0000 mg | SUBCUTANEOUS | Status: DC
  Administered 2015-12-15: 40 mg via SUBCUTANEOUS
  Filled 2015-12-15: qty 0.4

## 2015-12-15 MED ORDER — HYDROCODONE-ACETAMINOPHEN 5-325 MG PO TABS
1.0000 | ORAL_TABLET | Freq: Once | ORAL | Status: AC
  Administered 2015-12-15: 1 via ORAL
  Filled 2015-12-15: qty 1

## 2015-12-15 MED ORDER — ASPIRIN 81 MG PO CHEW
324.0000 mg | CHEWABLE_TABLET | Freq: Once | ORAL | Status: DC
  Filled 2015-12-15: qty 4

## 2015-12-15 MED ORDER — AMLODIPINE BESYLATE 5 MG PO TABS
5.0000 mg | ORAL_TABLET | Freq: Every day | ORAL | Status: DC
  Administered 2015-12-15 – 2015-12-16 (×2): 5 mg via ORAL
  Filled 2015-12-15 (×2): qty 1

## 2015-12-15 MED ORDER — SODIUM CHLORIDE 0.9 % IV BOLUS (SEPSIS)
500.0000 mL | Freq: Once | INTRAVENOUS | Status: AC
  Administered 2015-12-15: 500 mL via INTRAVENOUS

## 2015-12-15 MED ORDER — ACETAMINOPHEN 325 MG PO TABS: 650.0000 mg | ORAL_TABLET | ORAL | Status: DC | PRN

## 2015-12-15 MED ORDER — ONDANSETRON HCL 4 MG/2ML IJ SOLN: 4.0000 mg | Freq: Three times a day (TID) | INTRAMUSCULAR | Status: DC | PRN

## 2015-12-15 MED ORDER — SERTRALINE HCL 50 MG PO TABS
100.0000 mg | ORAL_TABLET | Freq: Every day | ORAL | Status: DC
  Administered 2015-12-16: 100 mg via ORAL
  Filled 2015-12-15: qty 2

## 2015-12-15 MED ORDER — ADULT MULTIVITAMIN W/MINERALS CH
1.0000 | ORAL_TABLET | Freq: Every day | ORAL | Status: DC
  Administered 2015-12-16: 1 via ORAL
  Filled 2015-12-15: qty 1

## 2015-12-15 MED ORDER — OXYCODONE-ACETAMINOPHEN 5-325 MG PO TABS
1.0000 | ORAL_TABLET | ORAL | Status: DC | PRN
  Administered 2015-12-15 – 2015-12-16 (×3): 1 via ORAL
  Filled 2015-12-15 (×3): qty 1

## 2015-12-15 MED ORDER — DEXTROSE 50 % IV SOLN
50.0000 mL | Freq: Once | INTRAVENOUS | Status: AC
  Administered 2015-12-15: 50 mL via INTRAVENOUS
  Filled 2015-12-15: qty 50

## 2015-12-15 MED ORDER — SODIUM CHLORIDE 0.9 % IV SOLN
INTRAVENOUS | Status: DC
  Administered 2015-12-15: 22:00:00 via INTRAVENOUS

## 2015-12-15 MED ORDER — PANTOPRAZOLE SODIUM 40 MG PO TBEC
40.0000 mg | DELAYED_RELEASE_TABLET | Freq: Every day | ORAL | Status: DC
  Administered 2015-12-16: 40 mg via ORAL
  Filled 2015-12-15: qty 1

## 2015-12-15 NOTE — ED Notes (Signed)
Pt up to the bathroom. Ambulates well, normal gait

## 2015-12-15 NOTE — ED Provider Notes (Signed)
[facility] Provider Note   CSN: [id number] Arrival date & time: 12/15/15  1623     History   Chief Complaint Chief Complaint  Patient presents with  . Chest Pain    HPI [name] is a 70 y.o. female.  HPI Patient reports that she had sudden onset of chest pain while she was watching television. She reports she has not been active recently. She reports the pain is severe and tight and very uncomfortable. She indicates is on the left side of her chest. She denies she felt short of breath. She reports however she did start to feel sweaty and dizzy after she went into the bathroom. She did not develop any nausea or vomiting or syncope. No radiation of pain. EMS administered aspirin and nitroglycerin without change. Patient reports this pain has been going on since about 11 in the morning. She reports is not quite as severe as it was initially but is still very painful. No associated lower extremity swelling or calf pain. She reports she did have similar pain when she was seen at the hospital about a month ago. She reports it never came back again. She states that she has been able to pursue usual activities without exertional chest pain or shortness of breath. She was able to walk on her property for about 30 minutes without any signs of chest discomfort or shortness of breath. Past Medical History:  Diagnosis Date  . Anemia   . Anxiety   . Azotemia   . Chest pain   . Chicken pox   . Depressive disorder   . Dizziness   . Fall 06/15/2011   Caused discomfort in lower left rib and left lateral hip area  . H/O: hysterectomy 1985  . History of MRSA infection   . Hypokalemia   . Left trimalleolar fracture   . Leukocytosis   . OCD (obsessive compulsive disorder)   . Opioid abuse   . Pre-syncope   . Respiratory failure (HCC)   . Severe sepsis (HCC)   . SOB (shortness of breath) on exertion     Patient Active Problem List   Diagnosis Date Noted  . Hyperkalemia  12/15/2015  . GERD (gastroesophageal reflux disease) 12/15/2015  . Staphylococcus aureus bacteremia   . Postoperative infection   . Acute blood loss anemia 03/01/2015  . Hip pain, acute   . Postop check   . Wound cellulitis   . Wound dehiscence 02/27/2015  . Tachycardia 02/27/2015  . Deep incisional surgical site infection 02/27/2015  . History of diastolic dysfunction 12/25/2014  . Closed left hip fracture (HCC) 12/25/2014  . History of sinus tachycardia 12/25/2014  . Anxiety 12/25/2014  . PNA (pneumonia) 12/25/2014  . Fracture, intertrochanteric, left femur (HCC) 12/25/2014  . Abnormal chest x-ray   . Sinus tachycardia 12/16/2014  . Right ventricular hypertrophy 12/16/2014  . Left Trimalleolar fracture with displacement of the ankle joint S/P ORIF 11/26/2014  . Essential hypertension 11/26/2014  . Encounter for central line care 11/26/2014  . HCAP (healthcare-associated pneumonia) 11/26/2014  . Leukocytosis 11/26/2014  . Chronic depression 11/26/2014  . Esophageal reflux 11/26/2014  . Protein-calorie malnutrition (HCC) 11/26/2014  . Chest pain 08/07/2014  . Anemia 08/07/2014  . AKI (acute kidney injury) (HCC) 08/07/2014  . Hyperglycemia 08/07/2014  . Hyponatremia 08/07/2014  . Hypoxia 05/18/2014  . Angina decubitus (HCC) 07/20/2011  . Palpitations 07/20/2011  . Hypercholesterolemia 07/20/2011  . Family history of coronary artery disease 07/20/2011    Past Surgical History:  Procedure Laterality Date  . APPENDECTOMY    . APPLICATION OF WOUND VAC Left 02/27/2015   Procedure: APPLICATION OF WOUND VAC;  Surgeon: Samson FredericBrian Swinteck, MD;  Location: [facility] OR;  Service: Orthopedics;  Laterality: Left;  . GALLBLADDER SURGERY    . HARDWARE REMOVAL Left 02/27/2015   Procedure: HARDWARE REMOVAL OF ANKLE AND I AND D;  Surgeon: Samson FredericBrian Swinteck, MD;  Location: [facility] OR;  Service: Orthopedics;  Laterality: Left;  . INTRAMEDULLARY (IM) NAIL INTERTROCHANTERIC Left 12/25/2014   Procedure:  INTRAMEDULLARY (IM) NAIL INTERTROCHANTRIC;  Surgeon: Toni ArthursJohn Hewitt, MD;  Location: [facility] OR;  Service: Orthopedics;  Laterality: Left;  . TOTAL ABDOMINAL HYSTERECTOMY      OB History    No data available       Home Medications    Prior to Admission medications   Medication Sig Start Date End Date Taking? Authorizing Provider  aspirin 325 MG tablet Take 325 mg by mouth daily.    Historical Provider, MD  calcium carbonate (OSCAL) 1500 (600 Ca) MG TABS tablet Take 600 mg of elemental calcium by mouth daily with breakfast.    Historical Provider, MD  ceFAZolin (ANCEF) 2-3 GM-% SOLR Inject 50 mLs (2 g total) into the vein every 8 (eight) hours. Patient not taking: Reported on 11/03/2015 03/04/15   Rhetta MuraJai-Gurmukh Samtani, MD  Cephalexin 500 MG tablet Take 1 tablet (500 mg total) by mouth 3 (three) times daily. Patient not taking: Reported on 11/03/2015 04/15/15   Judyann Munsonynthia Snider, MD  lisinopril (PRINIVIL,ZESTRIL) 10 MG tablet Take 10 mg by mouth daily.    Historical Provider, MD  metoprolol tartrate (LOPRESSOR) 25 MG tablet Take 1 tablet (25 mg total) by mouth 2 (two) times daily. 03/04/15   Rhetta MuraJai-Gurmukh Samtani, MD  Multiple Vitamin (MULTIVITAMIN WITH MINERALS) TABS tablet Take 1 tablet by mouth daily.    Historical Provider, MD  pantoprazole (PROTONIX) 40 MG tablet Take 40 mg by mouth daily. Before breakfast    Historical Provider, MD  sertraline (ZOLOFT) 100 MG tablet Take 100 mg by mouth daily.    Historical Provider, MD    Family History Family History  Problem Relation Age of Onset  . Heart attack Mother   . Colon cancer Father   . Diabetes Brother   . Heart attack Brother [id number]    MI    Social History Social History  Substance Use Topics  . Smoking status: Never Smoker  . Smokeless tobacco: Never Used  . Alcohol use No     Allergies   Sulfa antibiotics   Review of Systems Review of Systems 10 Systems reviewed and are negative for acute change except as noted in the HPI.   Physical  Exam Updated Vital Signs BP (!) 143/48   Pulse 73   Temp 98.2 F (36.8 C) (Oral)   Resp 23   SpO2 100%   Physical Exam  Constitutional: She appears well-developed and well-nourished. No distress.  HENT:  Head: Normocephalic and atraumatic.  Eyes: Conjunctivae are normal.  Neck: Neck supple.  Cardiovascular: Normal rate, regular rhythm, normal heart sounds and intact distal pulses.   No murmur heard. Pulmonary/Chest: Effort normal and breath sounds normal. No respiratory distress. She has no wheezes. She has no rales. She exhibits tenderness.  Patient endorses severe pain to palpation along the sternocostal margins on the left upper and mid chest. This extends out over to the mid left chest as well. Patient winces. No soft tissue abnormality or rash. Right chest is nontender to palpation.  Abdominal:  Soft. She exhibits no distension. There is no tenderness.  Musculoskeletal: She exhibits no edema or tenderness.  Neurological: She is alert. She exhibits normal muscle tone. Coordination normal.  Skin: Skin is warm and dry.  Psychiatric: She has a normal mood and affect.  Nursing note and vitals reviewed.    ED Treatments / Results  Labs (all labs ordered are listed, but only abnormal results are displayed) Labs Reviewed  BASIC METABOLIC PANEL - Abnormal; Notable for the following:       Result Value   Potassium 5.2 (*)    CO2 21 (*)    Glucose, Bld 109 (*)    BUN 25 (*)    Creatinine, Ser 1.34 (*)    GFR calc non Af Amer 39 (*)    GFR calc Af Amer 45 (*)    All other components within normal limits  CBC - Abnormal; Notable for the following:    RBC 3.53 (*)    Hemoglobin 11.1 (*)    HCT 34.3 (*)    All other components within normal limits  RAPID URINE DRUG SCREEN, HOSP PERFORMED  HEMOGLOBIN A1C  LIPID PANEL  TROPONIN I  TROPONIN I  TROPONIN I  I-STAT TROPOININ, ED    EKG  EKG Interpretation  Date/Time:  Tuesday December 15 2015 16:30:52 EST Ventricular Rate:    72 PR Interval:    QRS Duration: 84 QT Interval:  372 QTC Calculation: 408 R Axis:   60 Text Interpretation:  Sinus rhythm Abnormal R-wave progression, early transition hyperacute T waves, no sig change sonce previous ECG. Confirmed by Donnald Garre, MD, Lebron Conners [id number]) on 12/15/2015 7:30:43 PM       Radiology Dg Chest 2 View  Result Date: 12/15/2015 CLINICAL DATA:  Midline chest pain since 1400 hours today, hypertension EXAM: CHEST  2 VIEW COMPARISON:  11/03/2015 FINDINGS: Normal heart size, mediastinal contours, and pulmonary vascularity. Chronic bronchitic changes. Bibasilar scarring. No definite infiltrate, pleural effusion, or pneumothorax. Diffuse osseous demineralization. IMPRESSION: Chronic bronchitic changes and bibasilar scarring. No acute abnormalities. Electronically Signed   By: Ulyses Southward M.D.   On: 12/15/2015 17:40    Procedures Procedures (including critical care time) CRITICAL CARE Performed by: Arby Barrette   Total critical care time: 30 minutes  Critical care time was exclusive of separately billable procedures and treating other patients.  Critical care was necessary to treat or prevent imminent or life-threatening deterioration.  Critical care was time spent personally by me on the following activities: development of treatment plan with patient and/or surrogate as well as nursing, discussions with consultants, evaluation of patient's response to treatment, examination of patient, obtaining history from patient or surrogate, ordering and performing treatments and interventions, ordering and review of laboratory studies, ordering and review of radiographic studies, pulse oximetry and re-evaluation of patient's condition. Medications Ordered in ED Medications  aspirin chewable tablet 324 mg (324 mg Oral Not Given 12/15/15 1825)  multivitamin with minerals tablet 1 tablet (not administered)  aspirin tablet 325 mg (not administered)  calcium carbonate (OSCAL) tablet  1,500 mg (not administered)  metoprolol tartrate (LOPRESSOR) tablet 25 mg (not administered)  pantoprazole (PROTONIX) EC tablet 40 mg (not administered)  sertraline (ZOLOFT) tablet 100 mg (not administered)  amLODipine (NORVASC) tablet 5 mg (not administered)  hydrALAZINE (APRESOLINE) injection 5 mg (not administered)  ondansetron (ZOFRAN) injection 4 mg (not administered)  acetaminophen (TYLENOL) tablet 650 mg (not administered)  enoxaparin (LOVENOX) injection 40 mg (not administered)  zolpidem (AMBIEN) tablet 5 mg (not administered)  ALPRAZolam (XANAX) tablet 0.25 mg (not administered)  oxyCODONE-acetaminophen (PERCOCET/ROXICET) 5-325 MG per tablet 1 tablet (not administered)  nitroGLYCERIN (NITROSTAT) SL tablet 0.4 mg (not administered)  0.9 %  sodium chloride infusion (not administered)  HYDROcodone-acetaminophen (NORCO/VICODIN) 5-325 MG per tablet 1 tablet (1 tablet Oral Given 12/15/15 1817)  sodium chloride 0.9 % bolus 500 mL (500 mLs Intravenous New Bag/Given 12/15/15 1930)  sodium polystyrene (KAYEXALATE) 15 GM/60ML suspension 30 g (30 g Oral Given 12/15/15 2013)  insulin aspart (novoLOG) injection 5 Units (5 Units Subcutaneous Given 12/15/15 2013)  dextrose 50 % solution 50 mL (50 mLs Intravenous Given 12/15/15 2013)     Initial Impression / Assessment and Plan / ED Course  I have reviewed the triage vital signs and the nursing notes.  Pertinent labs & imaging results that were available during my care of the patient were reviewed by me and considered in my medical decision making (see chart for details).  Clinical Course    Consult: [facility] hospitalist for admission.  Final Clinical Impressions(s) / ED Diagnoses   Final diagnoses:  Chest wall pain  AKI (acute kidney injury) (HCC)  Hyperkalemia   Patient presents with chief complaint chest pain. Chest pain has very reproducible on examination. It has been constant. Patient's troponin is negative. She does have aching of  the T waves which was present on EKG approximately one month ago. Patient however has developed mild acute kidney injury and hyperkalemia likely due to ACE. At this time, with combination of peaking T waves and hyperkalemia, patient will be treated with oral Kayexalate and admitted for observation and monitoring. New Prescriptions New Prescriptions   No medications on file     Arby Barrette, MD 12/15/15 2042

## 2015-12-15 NOTE — H&P (Signed)
History and Physical    [name]:[id number] DOB: [birth year] DOA: 12/15/2015  Referring MD/NP/PA:   PCP: [facility]   Patient coming from:  The patient is coming from home.  At baseline, pt is independent for most of ADL.    Chief Complaint: Chest pain  HPI: [name] is a 70 y.o. female with medical history significant of Hypertension, hyperlipidemia, GERD, depression, opioid abuse, OCD, anemia, who presents with chest pain.  Patient states that her chest pain started at 2 PM. It is located in the front chest, constant, sharp, 8 out of 10 in severity, nonradiating. It is not aggravated or alleviated any known factors. She had one episode of dizziness and diaphoresis which has resolved. She does not have shortness of breath, cough, fever or chills. She has chest wall tenderness, no history of injury. No tenderness over calf areas. No recent long distance traveling. Patient denies nausea, vomiting, abdominal pain, diarrhea, symptoms of UTI. No unilateral weakness, numbness or tingling in extremities. No vision change or hearing loss.  ED Course: pt was found to have negative troponin, WBC 7.6, potassium 5.2 with T-wave peaking in V2-V5 (patient had T-wave peaking in previous EKG, but today it is slightly worse than previous EKG), AKI with Cre 1.34, temperature normal, no tachycardia, and saturation 99% on room air. CXR showed chronic bronchitic changes and bibasilar scarring. Pt is placed on telemetry bed for observation.  Review of Systems:   General: no fevers, chills, no changes in body weight, has poor appetite, has fatigue HEENT: no blurry vision, hearing changes or sore throat Respiratory: no dyspnea, coughing, wheezing CV: has chest pain, no palpitations GI: no nausea, vomiting, abdominal pain, diarrhea, constipation GU: no dysuria, burning on urination, increased urinary frequency, hematuria  Ext: no leg edema Neuro: no unilateral weakness,  numbness, or tingling, no vision change or hearing loss Skin: no rash, no skin tear. MSK: No muscle spasm, no deformity, no limitation of range of movement in spin. has chest wall tenderness. Heme: No easy bruising.  Travel history: No recent long distant travel.  Allergy:  Allergies  Allergen Reactions  . Sulfa Antibiotics Hives    Past Medical History:  Diagnosis Date  . Anemia   . Anxiety   . Azotemia   . Chest pain   . Chicken pox   . Depressive disorder   . Dizziness   . Fall 06/15/2011   Caused discomfort in lower left rib and left lateral hip area  . H/O: hysterectomy 1985  . History of MRSA infection   . Hypokalemia   . Left trimalleolar fracture   . Leukocytosis   . OCD (obsessive compulsive disorder)   . Opioid abuse   . Pre-syncope   . Respiratory failure (HCC)   . Severe sepsis (HCC)   . SOB (shortness of breath) on exertion     Past Surgical History:  Procedure Laterality Date  . APPENDECTOMY    . APPLICATION OF WOUND VAC Left 02/27/2015   Procedure: APPLICATION OF WOUND VAC;  Surgeon: Samson Frederic, MD;  Location: [facility] OR;  Service: Orthopedics;  Laterality: Left;  . GALLBLADDER SURGERY    . HARDWARE REMOVAL Left 02/27/2015   Procedure: HARDWARE REMOVAL OF ANKLE AND I AND D;  Surgeon: Samson Frederic, MD;  Location: [facility] OR;  Service: Orthopedics;  Laterality: Left;  . INTRAMEDULLARY (IM) NAIL INTERTROCHANTERIC Left 12/25/2014   Procedure: INTRAMEDULLARY (IM) NAIL INTERTROCHANTRIC;  Surgeon: Toni Arthurs, MD;  Location: [facility]  OR;  Service: Orthopedics;  Laterality: Left;  . TOTAL ABDOMINAL HYSTERECTOMY      Social History:  reports that she has never smoked. She has never used smokeless tobacco. She reports that she does not drink alcohol or use drugs.  Family History:  Family History  Problem Relation Age of Onset  . Heart attack Mother   . Colon cancer Father   . Diabetes Brother   . Heart attack Brother [id number]    MI     Prior to Admission medications     Medication Sig Start Date End Date Taking? Authorizing Provider  aspirin 325 MG tablet Take 325 mg by mouth daily.    Historical Provider, MD  calcium carbonate (OSCAL) 1500 (600 Ca) MG TABS tablet Take 600 mg of elemental calcium by mouth daily with breakfast.    Historical Provider, MD  ceFAZolin (ANCEF) 2-3 GM-% SOLR Inject 50 mLs (2 g total) into the vein every 8 (eight) hours. Patient not taking: Reported on 11/03/2015 03/04/15   Rhetta MuraJai-Gurmukh Samtani, MD  Cephalexin 500 MG tablet Take 1 tablet (500 mg total) by mouth 3 (three) times daily. Patient not taking: Reported on 11/03/2015 04/15/15   Judyann Munsonynthia Snider, MD  lisinopril (PRINIVIL,ZESTRIL) 10 MG tablet Take 10 mg by mouth daily.    Historical Provider, MD  metoprolol tartrate (LOPRESSOR) 25 MG tablet Take 1 tablet (25 mg total) by mouth 2 (two) times daily. 03/04/15   Rhetta MuraJai-Gurmukh Samtani, MD  Multiple Vitamin (MULTIVITAMIN WITH MINERALS) TABS tablet Take 1 tablet by mouth daily.    Historical Provider, MD  pantoprazole (PROTONIX) 40 MG tablet Take 40 mg by mouth daily. Before breakfast    Historical Provider, MD  sertraline (ZOLOFT) 100 MG tablet Take 100 mg by mouth daily.    Historical Provider, MD    Physical Exam: Vitals:   12/15/15 1645 12/15/15 1700 12/15/15 1715 12/15/15 1915  BP: 163/55 (!) 146/52 (!) 153/52 147/56  Pulse: 73 69 71 72  Resp: 14 17 25 24   Temp:      TempSrc:      SpO2: 100% 100% 99% 100%   General: Not in acute distress HEENT:       Eyes: PERRL, EOMI, no scleral icterus.       ENT: No discharge from the ears and nose, no pharynx injection, no tonsillar enlargement.        Neck: No JVD, no bruit, no mass felt. Heme: No neck lymph node enlargement. Cardiac: S1/S2, RRR, No murmurs, No gallops or rubs. Respiratory: No rales, wheezing, rhonchi or rubs. GI: Soft, nondistended, nontender, no rebound pain, no organomegaly, BS present. GU: No hematuria Ext: No pitting leg edema bilaterally. 2+DP/PT pulse  bilaterally. Musculoskeletal: No joint deformities, No joint redness or warmth, no limitation of ROM in spin. Has reproducible chest wall tenderness to palpation Skin: No rashes.  Neuro: Alert, oriented X3, cranial nerves II-XII grossly intact, moves all extremities normally. Psych: Patient is not psychotic, no suicidal or hemocidal ideation.  Labs on Admission: I have personally reviewed following labs and imaging studies  CBC:  Recent Labs Lab 12/15/15 1824  WBC 7.6  HGB 11.1*  HCT 34.3*  MCV 97.2  PLT 270   Basic Metabolic Panel:  Recent Labs Lab 12/15/15 1824  NA 137  K 5.2*  CL 105  CO2 21*  GLUCOSE 109*  BUN 25*  CREATININE 1.34*  CALCIUM 9.1   GFR: CrCl cannot be calculated (Unknown ideal weight.). Liver Function Tests: No results for input(s):  AST, ALT, ALKPHOS, BILITOT, PROT, ALBUMIN in the last 168 hours. No results for input(s): LIPASE, AMYLASE in the last 168 hours. No results for input(s): AMMONIA in the last 168 hours. Coagulation Profile: No results for input(s): INR, PROTIME in the last 168 hours. Cardiac Enzymes: No results for input(s): CKTOTAL, CKMB, CKMBINDEX, TROPONINI in the last 168 hours. BNP (last 3 results) No results for input(s): PROBNP in the last 8760 hours. HbA1C: No results for input(s): HGBA1C in the last 72 hours. CBG: No results for input(s): GLUCAP in the last 168 hours. Lipid Profile: No results for input(s): CHOL, HDL, LDLCALC, TRIG, CHOLHDL, LDLDIRECT in the last 72 hours. Thyroid Function Tests: No results for input(s): TSH, T4TOTAL, FREET4, T3FREE, THYROIDAB in the last 72 hours. Anemia Panel: No results for input(s): VITAMINB12, FOLATE, FERRITIN, TIBC, IRON, RETICCTPCT in the last 72 hours. Urine analysis:    Component Value Date/Time   COLORURINE AMBER (A) 12/25/2014 1555   APPEARANCEUR CLEAR 12/25/2014 1555   LABSPEC 1.020 12/25/2014 1555   PHURINE 5.5 12/25/2014 1555   GLUCOSEU NEGATIVE 12/25/2014 1555   HGBUR  NEGATIVE 12/25/2014 1555   BILIRUBINUR NEGATIVE 12/25/2014 1555   KETONESUR NEGATIVE 12/25/2014 1555   PROTEINUR NEGATIVE 12/25/2014 1555   UROBILINOGEN 0.2 07/23/2013 1125   NITRITE NEGATIVE 12/25/2014 1555   LEUKOCYTESUR NEGATIVE 12/25/2014 1555   Sepsis Labs: @LABRCNTIP (procalcitonin:4,lacticidven:4) )No results found for this or any previous visit (from the past 240 hour(s)).   Radiological Exams on Admission: Dg Chest 2 View  Result Date: 12/15/2015 CLINICAL DATA:  Midline chest pain since 1400 hours today, hypertension EXAM: CHEST  2 VIEW COMPARISON:  11/03/2015 FINDINGS: Normal heart size, mediastinal contours, and pulmonary vascularity. Chronic bronchitic changes. Bibasilar scarring. No definite infiltrate, pleural effusion, or pneumothorax. Diffuse osseous demineralization. IMPRESSION: Chronic bronchitic changes and bibasilar scarring. No acute abnormalities. Electronically Signed   By: Ulyses Southward M.D.   On: 12/15/2015 17:40     EKG: Independently reviewed.  Sinus rhythm, QTC 408, T-wave peaking in V2-V5, early R-wave progression  Assessment/Plan Principal Problem:   Chest pain Active Problems:   Hypercholesterolemia   AKI (acute kidney injury) (HCC)   Essential hypertension   Hyperkalemia   GERD (gastroesophageal reflux disease)   Chest pain: Most likely due to musculoskeletal pain given reproducible chest wall tenderness, but cannot completely rule out cardiac issues giving her old age and history of hypertension. Currently no shortness breath or signs of DVT, less likely to have PE. Chest x-ray has no infiltration.  - will place on Tele bed for obs - cycle CE q6 x3 and repeat her EKG in the am  - prn Nitroglycerin, percoct, and aspirin - Risk factor stratification: will check FLP, UDS and A1C  - 2d echo  HLD: Last LDL was 69 on 08/07/14. Not taking medications currently -Check FLP  Hyperkalemia: Potassium 5.2 with T-wave peaking in V2-V5. -treated with D50, 5  unit of Novolog by IV,  -give 30 g Kayexalate -f/u by BMP -hold lisinopril  AKI: Likely due to prerenal secondary to dehydration and continuation of ACEI - IVF: NS 500 cc and then 75 cc/h - Check FeNa - Follow up renal function by BMP - Hold lisinopril  Essential hypertension -Switch lisinopril to amlodipine due to a GI -IV hydralazine when necessary  GERD: -Protonix  Depression: Stable, no suicidal or homicidal ideations. -Continue home medications: Zoloft  DVT ppx: SQ Lovenox Code Status: Full code Family Communication: Yes, patient's daughter at bed side Disposition Plan:  Anticipate  discharge back to previous home environment Consults called:  none Admission status: Obs / tele  Date of Service 12/15/2015    Lorretta HarpIU, Daveon Arpino [facility] Pager [phone]  If 7PM-7AM, please contact night-coverage [email or web address] Password [email or web address] 12/15/2015, 8:17 PM

## 2015-12-15 NOTE — ED Triage Notes (Signed)
Pt arrives from home via [facility] c/o CP new onset today.  Pt reports diaphoresis with dizziness, denies N/V, LOC.  EMS reports pain repeatable with palpation.  EMS reports giving 324 ASA and 1 nitro with no change.  Pt AOx4 on arrival, resp unlabored.

## 2015-12-15 NOTE — ED Notes (Signed)
RN attempted to call report x1 

## 2015-12-16 ENCOUNTER — Encounter (HOSPITAL_COMMUNITY): Payer: Self-pay | Admitting: General Practice

## 2015-12-16 DIAGNOSIS — R079 Chest pain, unspecified: Secondary | ICD-10-CM | POA: Diagnosis not present

## 2015-12-16 DIAGNOSIS — E7800 Pure hypercholesterolemia, unspecified: Secondary | ICD-10-CM | POA: Diagnosis not present

## 2015-12-16 DIAGNOSIS — E875 Hyperkalemia: Secondary | ICD-10-CM

## 2015-12-16 DIAGNOSIS — K219 Gastro-esophageal reflux disease without esophagitis: Secondary | ICD-10-CM

## 2015-12-16 DIAGNOSIS — I10 Essential (primary) hypertension: Secondary | ICD-10-CM | POA: Diagnosis not present

## 2015-12-16 DIAGNOSIS — R0789 Other chest pain: Secondary | ICD-10-CM | POA: Diagnosis not present

## 2015-12-16 DIAGNOSIS — N179 Acute kidney failure, unspecified: Secondary | ICD-10-CM

## 2015-12-16 LAB — LIPID PANEL
CHOL/HDL RATIO: 3.8 ratio
Cholesterol: 165 mg/dL (ref 0–200)
HDL: 44 mg/dL (ref >40)
LDL Cholesterol: 100 mg/dL — ABNORMAL HIGH (ref 0–99)
Triglycerides: 104 mg/dL (ref <150)
VLDL: 21 mg/dL (ref 0–40)

## 2015-12-16 LAB — TROPONIN I

## 2015-12-16 LAB — BASIC METABOLIC PANEL
Anion gap: 8 (ref 5–15)
BUN: 16 mg/dL (ref 6–20)
CALCIUM: 8.7 mg/dL — AB (ref 8.9–10.3)
CO2: 23 mmol/L (ref 22–32)
CREATININE: 0.98 mg/dL (ref 0.44–1.00)
Chloride: 109 mmol/L (ref 101–111)
GFR calc Af Amer: >60 mL/min (ref >60)
GFR, EST NON AFRICAN AMERICAN: 57 mL/min — AB (ref >60)
Glucose, Bld: 109 mg/dL — ABNORMAL HIGH (ref 65–99)
Potassium: 4.6 mmol/L (ref 3.5–5.1)
SODIUM: 140 mmol/L (ref 135–145)

## 2015-12-16 MED ORDER — INFLUENZA VAC SPLIT QUAD 0.5 ML IM SUSY
0.5000 mL | PREFILLED_SYRINGE | INTRAMUSCULAR | Status: AC
Start: 2015-12-17 — End: 2015-12-16
  Administered 2015-12-16: 0.5 mL via INTRAMUSCULAR

## 2015-12-16 MED ORDER — CAPSAICIN 0.025 % EX CREA
TOPICAL_CREAM | Freq: Two times a day (BID) | CUTANEOUS | Status: DC
  Administered 2015-12-16: 11:00:00 via TOPICAL
  Filled 2015-12-16: qty 60

## 2015-12-16 MED ORDER — CAPSAICIN 0.025 % EX CREA: TOPICAL_CREAM | Freq: Two times a day (BID) | CUTANEOUS | 0 refills | Status: DC

## 2015-12-16 MED ORDER — AMLODIPINE BESYLATE 5 MG PO TABS: 5.0000 mg | ORAL_TABLET | Freq: Every day | ORAL | 0 refills | Status: AC

## 2015-12-16 NOTE — Discharge Instructions (Signed)
Chest Wall Pain °Introduction °Chest wall pain is pain in or around the bones and muscles of your chest. Sometimes, an injury causes this pain. Sometimes, the cause may not be known. This pain may take several weeks or longer to get better. °Follow these instructions at home: °Pay attention to any changes in your symptoms. Take these actions to help with your pain: °· Rest as told by your doctor. °· Avoid activities that cause pain. Try not to use your chest, belly (abdominal), or side muscles to lift heavy things. °· If directed, apply ice to the painful area: °¨ Put ice in a plastic bag. °¨ Place a towel between your skin and the bag. °¨ Leave the ice on for 20 minutes, 2-3 times per day. °· Take over-the-counter and prescription medicines only as told by your doctor. °· Do not use tobacco products, including cigarettes, chewing tobacco, and e-cigarettes. If you need help quitting, ask your doctor. °· Keep all follow-up visits as told by your doctor. This is important. °Contact a doctor if: °· You have a fever. °· Your chest pain gets worse. °· You have new symptoms. °Get help right away if: °· You feel sick to your stomach (nauseous) or you throw up (vomit). °· You feel sweaty or light-headed. °· You have a cough with phlegm (sputum) or you cough up blood. °· You are short of breath. °This information is not intended to replace advice given to you by your health care provider. Make sure you discuss any questions you have with your health care provider. °Document Released: 07/06/2007 Document Revised: 06/25/2015 Document Reviewed: 04/14/2014 °© 2017 Elsevier ° °

## 2015-12-16 NOTE — Progress Notes (Signed)
Pt discharged to home via W/C, accompanied by family, condition stable, discharge education complete.  Raymon MuttonGwen Brode Sculley RN

## 2015-12-16 NOTE — Consult Note (Signed)
Patient ID: [name] MRN: [id number], DOB/AGE: 71/05/1945   Admit date: 12/15/2015   Reason for Consult: Chest Pain Requesting MD: Dr. Benjamine MolaVann, Internal Medicine  [facility] Primary Cardiologist: Dr. Elease HashimotoNahser   Pt. Profile:  70 y/o female with h/o HTN and HLD admitted for chest pain.   Problem List  Past Medical History:  Diagnosis Date  . Anemia   . Anxiety   . Azotemia   . Chest pain   . Chicken pox   . Depressive disorder   . Dizziness   . Fall 06/15/2011   Caused discomfort in lower left rib and left lateral hip area  . H/O: hysterectomy 1985  . History of MRSA infection   . Hypokalemia   . Left trimalleolar fracture   . Leukocytosis   . OCD (obsessive compulsive disorder)   . Opioid abuse   . Pre-syncope   . Respiratory failure (HCC)   . Severe sepsis (HCC)   . SOB (shortness of breath) on exertion     Past Surgical History:  Procedure Laterality Date  . APPENDECTOMY    . APPLICATION OF WOUND VAC Left 02/27/2015   Procedure: APPLICATION OF WOUND VAC;  Surgeon: Samson FredericBrian Swinteck, MD;  Location: [facility] OR;  Service: Orthopedics;  Laterality: Left;  . GALLBLADDER SURGERY    . HARDWARE REMOVAL Left 02/27/2015   Procedure: HARDWARE REMOVAL OF ANKLE AND I AND D;  Surgeon: Samson FredericBrian Swinteck, MD;  Location: [facility] OR;  Service: Orthopedics;  Laterality: Left;  . INTRAMEDULLARY (IM) NAIL INTERTROCHANTERIC Left 12/25/2014   Procedure: INTRAMEDULLARY (IM) NAIL INTERTROCHANTRIC;  Surgeon: Toni ArthursJohn Hewitt, MD;  Location: [facility] OR;  Service: Orthopedics;  Laterality: Left;  . TOTAL ABDOMINAL HYSTERECTOMY       Allergies  Allergies  Allergen Reactions  . Sulfa Antibiotics Hives    HPI  70 y/o female, seen in the past by Dr. SwazilandJordan in 2013 and Dr. Elease HashimotoNahser in 2016, who has been admitted by IM for CP evaluation. In 2013, she was evaluated for chest pain and palpitations. Stress echo was normal. Event monitor unremarkable. No arrhthymias. Her  most recent echocardiogram was 03/04/15. This demonstrated normal LV systolic function. EF 55-60%. Grade 1DD was noted as well as mild MR. Her cardiac risk factors include HTN and HLD. No personal h/o DM or tobacco use.   She was in her usual state of health until yesterday afternoon around 2 pm. She had finished a light lunch consisting of a grilled cheese sandwich. She was resting on her couch watching TV when she developed sudden onset substernal to left sided CP. Sharp in nature. Non radiating. No associated dyspnea, but she did feel a bit lightheaded and dizzy and broke out into a sweat. Only  exacerbating factor is palpation of chest wall. Not worse with positional changes nor exertion. Non pleuritic. No alleviating factors. She tried Tylenol and ASA at home w/o relief. Her symptoms persisted. She called her son who brought her to the ED. Initial EKG shows slightly hyperacute Twaves. K was slightly elevated at 5.2. K today is stable at 4.6. Repeat EKG this morning shows NSR. Cardiac enzymes were cycled x 3 and all negative. She has mild anemia with Hgb of 11.1. She remains with mild CP. BP and HR both stable. CXR report pending. She is currently on ASA, amlodipine and metoprolol. She is NPO. She states that she is not very active but has no exertional CP or dyspnea with basic ADLs.  Home Medications  Prior to Admission medications   Medication Sig Start Date End Date Taking? Authorizing Provider  aspirin 325 MG tablet Take 325 mg by mouth daily.    Historical Provider, MD  calcium carbonate (OSCAL) 1500 (600 Ca) MG TABS tablet Take 600 mg of elemental calcium by mouth daily with breakfast.    Historical Provider, MD  ceFAZolin (ANCEF) 2-3 GM-% SOLR Inject 50 mLs (2 g total) into the vein every 8 (eight) hours. Patient not taking: Reported on 11/03/2015 03/04/15   Rhetta Mura, MD  Cephalexin 500 MG tablet Take 1 tablet (500 mg total) by mouth 3 (three) times daily. Patient not taking: Reported  on 11/03/2015 04/15/15   Judyann Munson, MD  lisinopril (PRINIVIL,ZESTRIL) 10 MG tablet Take 10 mg by mouth daily.    Historical Provider, MD  metoprolol tartrate (LOPRESSOR) 25 MG tablet Take 1 tablet (25 mg total) by mouth 2 (two) times daily. 03/04/15   Rhetta Mura, MD  Multiple Vitamin (MULTIVITAMIN WITH MINERALS) TABS tablet Take 1 tablet by mouth daily.    Historical Provider, MD  pantoprazole (PROTONIX) 40 MG tablet Take 40 mg by mouth daily. Before breakfast    Historical Provider, MD  sertraline (ZOLOFT) 100 MG tablet Take 100 mg by mouth daily.    Historical Provider, MD   Hospital Meds  . amLODipine  5 mg Oral Daily  . aspirin  324 mg Oral Once  . aspirin  325 mg Oral Daily  . calcium carbonate  600 mg of elemental calcium Oral Q breakfast  . enoxaparin (LOVENOX) injection  40 mg Subcutaneous Q24H  . metoprolol tartrate  25 mg Oral BID  . multivitamin with minerals  1 tablet Oral Daily  . pantoprazole  40 mg Oral Daily  . sertraline  100 mg Oral Daily    Family History  Family History  Problem Relation Age of Onset  . Heart attack Mother   . Colon cancer Father   . Diabetes Brother   . Heart attack Brother 42    MI    Social History  Social History   Social History  . Marital status: Widowed    Spouse name: N/A  . Number of children: 3  . Years of education: N/A   Occupational History  . retail-retired    Social History Main Topics  . Smoking status: Never Smoker  . Smokeless tobacco: Never Used  . Alcohol use No  . Drug use: No  . Sexual activity: Not on file   Other Topics Concern  . Not on file   Social History Narrative  . No narrative on file     Review of Systems General:  No chills, fever, night sweats or weight changes.  Cardiovascular:  No chest pain, dyspnea on exertion, edema, orthopnea, palpitations, paroxysmal nocturnal dyspnea. Dermatological: No rash, lesions/masses Respiratory: No cough, dyspnea Urologic: No hematuria,  dysuria Abdominal:   No nausea, vomiting, diarrhea, bright red blood per rectum, melena, or hematemesis Neurologic:  No visual changes, wkns, changes in mental status. All other systems reviewed and are otherwise negative except as noted above.  Physical Exam  Blood pressure 140/65, pulse 82, temperature 97.6 F (36.4 C), temperature source Oral, resp. rate 13, height 5' 2.5" (1.588 m), weight 122 lb 9.6 oz (55.6 kg), SpO2 100 %.  General: Pleasant, NAD, elderly  Psych: Normal affect. Neuro: Alert and oriented X 3. Moves all extremities spontaneously. HEENT: Normal  Neck: Supple without JVD. ? Left sided bruit vs radiation of  cardiac murmur Lungs:  Resp regular and unlabored, CTA. Chest Wall: + tenderness along left sternum, reproducible CP Heart: RRR no s3, s4, + 2/6 murmur at Apex. Abdomen: Soft, non-tender, non-distended, BS + x 4.  Extremities: No clubbing, cyanosis or edema. DP/PT/Radials 2+ and equal bilaterally.  Labs  Troponin [facility](Point of Care Test)  Recent Labs  12/15/15 1850  TROPIPOC 0.00    Recent Labs  12/15/15 2112 12/16/15 0227  TROPONINI <0.03 <0.03   Lab Results  Component Value Date   WBC 7.6 12/15/2015   HGB 11.1 (L) 12/15/2015   HCT 34.3 (L) 12/15/2015   MCV 97.2 12/15/2015   PLT 270 12/15/2015    Recent Labs Lab 12/15/15 1824  NA 137  K 5.2*  CL 105  CO2 21*  BUN 25*  CREATININE 1.34*  CALCIUM 9.1  GLUCOSE 109*   Lab Results  Component Value Date   CHOL 165 12/16/2015   HDL 44 12/16/2015   LDLCALC 100 (H) 12/16/2015   TRIG 104 12/16/2015   No results found for: DDIMER   Radiology/Studies  Dg Chest 2 View  Result Date: 12/15/2015 CLINICAL DATA:  Midline chest pain since 1400 hours today, hypertension EXAM: CHEST  2 VIEW COMPARISON:  11/03/2015 FINDINGS: Normal heart size, mediastinal contours, and pulmonary vascularity. Chronic bronchitic changes. Bibasilar scarring. No definite infiltrate, pleural effusion, or pneumothorax.  Diffuse osseous demineralization. IMPRESSION: Chronic bronchitic changes and bibasilar scarring. No acute abnormalities. Electronically Signed   By: Ulyses SouthwardMark  Boles M.D.   On: 12/15/2015 17:40    ECG  NSR. No ischemia   ASSESSMENT AND PLAN  Principal Problem:   Chest pain Active Problems:   Hypercholesterolemia   AKI (acute kidney injury) (HCC)   Essential hypertension   Hyperkalemia   GERD (gastroesophageal reflux disease)  1. Atypical Chest Pain: left sided sharp chest pain reproducible with palpation of chest wall. She denies exertional chest pain and dyspnea. She has ruled out for MI with negative cardiac enzymes x 3. EKG NSR. She had a normal stress echo in 2013 and fairly normal 2D echo in Feb. Recommend trial of PRN NSAIDS, for musculoskeletal chest wall pain. Continue medical therapy for cardiac risk factors.   2. Cardiac Murmur: exam + for murmur at apex. 2D echo 03/2015 showed mild MR and normal LVF. She denies exertional dyspnea and no ischemic chest pain.   3. HTN: controlled on current regimen, metoprolol and amlodipine.   4. Hyperkalemia: K was 5.2 on admit. Stable this morning at 4.6. She was on 10 mg of lisinopril as an outpatient. This has been discontinued and amlodipine added.   Signed, Robbie LisBrittainy Shiana Rappleye, PA-C 12/16/2015, 7:57 AM

## 2015-12-16 NOTE — Discharge Summary (Signed)
Physician Discharge Summary  [name]:[id number] DOB: 1945/04/27 DOA: 12/15/2015  PCP: [facility]  Admit date: 12/15/2015 Discharge date: 12/16/2015   Recommendations for Outpatient Follow-Up:   1. Topical treatment for chest wall pain   Discharge Diagnosis:   Principal Problem:   Chest pain Active Problems:   Hypercholesterolemia   AKI (acute kidney injury) (HCC)   Essential hypertension   Hyperkalemia   GERD (gastroesophageal reflux disease)   Discharge disposition:  Home.:  Discharge Condition: Improved.  Diet recommendation: Low sodium, heart healthy  Wound care: None.   History of Present Illness:   [name] is a 70 y.o. female with medical history significant of Hypertension, hyperlipidemia, GERD, depression, opioid abuse, OCD, anemia, who presents with chest pain.  Patient states that her chest pain started at 2 PM. It is located in the front chest, constant, sharp, 8 out of 10 in severity, nonradiating. It is not aggravated or alleviated any known factors. She had one episode of dizziness and diaphoresis which has resolved. She does not have shortness of breath, cough, fever or chills. She has chest wall tenderness, no history of injury. No tenderness over calf areas. No recent long distance traveling. Patient denies nausea, vomiting, abdominal pain, diarrhea, symptoms of UTI. No unilateral weakness, numbness or tingling in extremities. No vision change or hearing loss.  ED Course: pt was found to have negative troponin, WBC 7.6, potassium 5.2 with T-wave peaking in V2-V5 (patient had T-wave peaking in previous EKG, but today it is slightly worse than previous EKG), AKI with Cre 1.34, temperature normal, no tachycardia, and saturation 99% on room air. CXR showed chronic bronchitic changes and bibasilar scarring. Pt is placed on telemetry bed for observation.   Hospital Course by Problem:    Atypical Chest Pain:    -reproducible with palpation of chest wall.  -denies exertional chest pain and dyspnea.  -ruled out for MI with negative cardiac enzymes x 3.  -normal stress echo in 2013 and fairly normal 2D echo in Feb. -topical treatment of chest wall pain -avoid narcotics in patient with h/o abuse   HTN: controlled on current regimen, metoprolol and amlodipine.   Hyperkalemia: K was 5.2 on admit. Stable this morning at 4.6.  -was on 10 mg of lisinopril as an outpatient -This has been discontinued and amlodipine added.      Medical Consultants:    cardiology   Discharge Exam:   Vitals:   12/16/15 0753 12/16/15 1050  BP:  (!) 152/54  Pulse: 82 97  Resp: 13   Temp: 97.6 F (36.4 C)    Vitals:   12/16/15 0459 12/16/15 0500 12/16/15 0753 12/16/15 1050  BP: 140/65 140/65  (!) 152/54  Pulse: 77  82 97  Resp: 17 19 13    Temp: 97.5 F (36.4 C)  97.6 F (36.4 C)   TempSrc: Oral  Oral   SpO2: 100%  100%   Weight: 55.6 kg (122 lb 9.6 oz)     Height:        Gen:  NAD-- pain improved with topical treatment  The results of significant diagnostics from this hospitalization (including imaging, microbiology, ancillary and laboratory) are listed below for reference.     Procedures and Diagnostic Studies:   Dg Chest 2 View  Result Date: 12/15/2015 CLINICAL DATA:  Midline chest pain since 1400 hours today, hypertension EXAM: CHEST  2 VIEW COMPARISON:  11/03/2015 FINDINGS: Normal heart size, mediastinal contours, and pulmonary vascularity.  Chronic bronchitic changes. Bibasilar scarring. No definite infiltrate, pleural effusion, or pneumothorax. Diffuse osseous demineralization. IMPRESSION: Chronic bronchitic changes and bibasilar scarring. No acute abnormalities. Electronically Signed   By: Ulyses SouthwardMark  Boles M.D.   On: 12/15/2015 17:40     Labs:   Basic Metabolic Panel:  Recent Labs Lab 12/15/15 1824 12/16/15 0715  NA 137 140  K 5.2* 4.6  CL 105 109  CO2 21* 23  GLUCOSE 109* 109*   BUN 25* 16  CREATININE 1.34* 0.98  CALCIUM 9.1 8.7*   GFR Estimated Creatinine Clearance: 42.6 mL/min (by C-G formula based on SCr of 0.98 mg/dL). Liver Function Tests: No results for input(s): AST, ALT, ALKPHOS, BILITOT, PROT, ALBUMIN in the last 168 hours. No results for input(s): LIPASE, AMYLASE in the last 168 hours. No results for input(s): AMMONIA in the last 168 hours. Coagulation profile No results for input(s): INR, PROTIME in the last 168 hours.  CBC:  Recent Labs Lab 12/15/15 1824  WBC 7.6  HGB 11.1*  HCT 34.3*  MCV 97.2  PLT 270   Cardiac Enzymes:  Recent Labs Lab 12/15/15 2112 12/16/15 0227 12/16/15 0715  TROPONINI <0.03 <0.03 <0.03   BNP: Invalid input(s): POCBNP CBG: No results for input(s): GLUCAP in the last 168 hours. D-Dimer No results for input(s): DDIMER in the last 72 hours. Hgb A1c No results for input(s): HGBA1C in the last 72 hours. Lipid Profile  Recent Labs  12/16/15 0227  CHOL 165  HDL 44  LDLCALC 100*  TRIG 104  CHOLHDL 3.8   Thyroid function studies No results for input(s): TSH, T4TOTAL, T3FREE, THYROIDAB in the last 72 hours.  Invalid input(s): FREET3 Anemia work up No results for input(s): VITAMINB12, FOLATE, FERRITIN, TIBC, IRON, RETICCTPCT in the last 72 hours. Microbiology No results found for this or any previous visit (from the past 240 hour(s)).   Discharge Instructions:   Discharge Instructions    Diet - low sodium heart healthy    Complete by:  As directed    Discharge instructions    Complete by:  As directed    Topical treatment for chest wall pain   Increase activity slowly    Complete by:  As directed        Medication List    STOP taking these medications   ceFAZolin 2-3 GM-% Solr Commonly known as:  ANCEF   Cephalexin 500 MG tablet   lisinopril 10 MG tablet Commonly known as:  PRINIVIL,ZESTRIL     TAKE these medications   acetaminophen 500 MG tablet Commonly known as:   TYLENOL Take 500 mg by mouth every 6 (six) hours as needed for moderate pain or headache.   amLODipine 5 MG tablet Commonly known as:  NORVASC Take 1 tablet (5 mg total) by mouth daily. Start taking on:  12/17/2015   aspirin 325 MG tablet Take 325 mg by mouth daily.   calcium carbonate 1500 (600 Ca) MG Tabs tablet Commonly known as:  OSCAL Take 600 mg of elemental calcium by mouth daily with breakfast.   capsaicin 0.025 % cream Commonly known as:  ZOSTRIX Apply topically 2 (two) times daily. To chest wall for pain   metoprolol tartrate 25 MG tablet Commonly known as:  LOPRESSOR Take 1 tablet (25 mg total) by mouth 2 (two) times daily.   multivitamin with minerals Tabs tablet Take 1 tablet by mouth daily.   pantoprazole 40 MG tablet Commonly known as:  PROTONIX Take 40 mg by mouth daily. Before breakfast  sertraline 100 MG tablet Commonly known as:  ZOLOFT Take 100 mg by mouth daily.         Time coordinating discharge: 35 min  Signed:  Jennifr Gaeta U Ules Marsala   [facility] 12/16/2015, 12:25 PM

## 2015-12-16 NOTE — Care Management Obs Status (Signed)
MEDICARE OBSERVATION STATUS NOTIFICATION   Patient Details  Name: [name] MRN: [id number] Date of Birth: 02/12/1945   Medicare Observation Status Notification Given:       Epifanio LeschesCole, Jesly Hartmann Hudson, RN 12/16/2015, 11:37 AM

## 2015-12-17 LAB — HEMOGLOBIN A1C
Hgb A1c MFr Bld: 4.9 % (ref 4.8–5.6)
Mean Plasma Glucose: 94 mg/dL

## 2016-05-13 ENCOUNTER — Other Ambulatory Visit: Payer: Self-pay | Admitting: Orthopedic Surgery

## 2016-05-13 ENCOUNTER — Ambulatory Visit: Payer: Self-pay | Admitting: Orthopedic Surgery

## 2016-05-19 ENCOUNTER — Ambulatory Visit: Payer: Self-pay | Admitting: Orthopedic Surgery

## 2016-05-19 NOTE — H&P (Signed)
TOTAL HIP ADMISSION H&P  Patient is admitted for left total hip arthroplasty.  Subjective:  Chief Complaint: left hip pain  HPI: [name], 71 y.o. female, has a history of pain and functional disability in the left hip(s) due to trauma and patient has failed non-surgical conservative treatments for greater than 12 weeks to include NSAID's and/or analgesics, corticosteriod injections, supervised PT with diminished ADL's post treatment, use of assistive devices, weight reduction as appropriate and activity modification.  Onset of symptoms was gradual starting 2 years ago with rapidlly worsening course since that time.The patient noted prior procedures of the hip to include IM nail on the right hip(s).  Patient currently rates pain in the left hip at 10 out of 10 with activity. Patient has night pain, worsening of pain with activity and weight bearing, pain that interfers with activities of daily living, pain with passive range of motion and crepitus. 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 This condition presents safety issues increasing the risk of falls. This patient has had proximal femur fracture.  There is no current active infection.  Patient Active Problem List   Diagnosis Date Noted  . Hyperkalemia 12/15/2015  . GERD (gastroesophageal reflux disease) 12/15/2015  . Staphylococcus aureus bacteremia   . Postoperative infection   . Acute blood loss anemia 03/01/2015  . Hip pain, acute   . Postop check   . Wound cellulitis   . Wound dehiscence 02/27/2015  . Tachycardia 02/27/2015  . Deep incisional surgical site infection 02/27/2015  . History of diastolic dysfunction 12/25/2014  . Closed left hip fracture (HCC) 12/25/2014  . History of sinus tachycardia 12/25/2014  . Anxiety 12/25/2014  . PNA (pneumonia) 12/25/2014  . Fracture, intertrochanteric, left femur (HCC) 12/25/2014  .  Abnormal chest x-ray   . Sinus tachycardia 12/16/2014  . Right ventricular hypertrophy 12/16/2014  . Left Trimalleolar fracture with displacement of the ankle joint S/P ORIF 11/26/2014  . Essential hypertension 11/26/2014  . Encounter for central line care 11/26/2014  . HCAP (healthcare-associated pneumonia) 11/26/2014  . Leukocytosis 11/26/2014  . Chronic depression 11/26/2014  . Esophageal reflux 11/26/2014  . Protein-calorie malnutrition (HCC) 11/26/2014  . Chest pain 08/07/2014  . Anemia 08/07/2014  . AKI (acute kidney injury) (HCC) 08/07/2014  . Hyperglycemia 08/07/2014  . Hyponatremia 08/07/2014  . Hypoxia 05/18/2014  . Angina decubitus (HCC) 07/20/2011  . Palpitations 07/20/2011  . Hypercholesterolemia 07/20/2011  . Family history of coronary artery disease 07/20/2011   Past Medical History:  Diagnosis Date  . Anemia   . Anxiety   . Arthritis   . Azotemia   . Chest pain   . Chicken pox   . Depressive disorder   . Dizziness   . Fall 06/15/2011   Caused discomfort in lower left rib and left lateral hip area  . GERD (gastroesophageal reflux disease)   . H/O: hysterectomy 1985  . History of MRSA infection   . Hypertension   . Hypokalemia   . Left trimalleolar fracture   . Leukocytosis   . OCD (obsessive compulsive disorder)   . Opioid abuse   . Pre-syncope   . Respiratory failure (HCC)   . Severe sepsis (HCC)   . SOB (shortness of breath) on exertion     Past Surgical History:  Procedure Laterality Date  . APPENDECTOMY    . APPLICATION OF WOUND VAC Left 02/27/2015   Procedure: APPLICATION OF WOUND VAC;  Surgeon: Arlys John  Emori Mumme, MD;  Location: [facility] OR;  Service: Orthopedics;  Laterality: Left;  . GALLBLADDER SURGERY    . HARDWARE REMOVAL Left 02/27/2015   Procedure: HARDWARE REMOVAL OF ANKLE AND I AND D;  Surgeon: Samson Frederic, MD;  Location: [facility] OR;  Service: Orthopedics;  Laterality: Left;  . INTRAMEDULLARY (IM) NAIL INTERTROCHANTERIC Left 12/25/2014    Procedure: INTRAMEDULLARY (IM) NAIL INTERTROCHANTRIC;  Surgeon: Toni Arthurs, MD;  Location: [facility] OR;  Service: Orthopedics;  Laterality: Left;  . TOTAL ABDOMINAL HYSTERECTOMY       (Not in a hospital admission) Allergies  Allergen Reactions  . Sulfa Antibiotics Hives    Social History  Substance Use Topics  . Smoking status: Never Smoker  . Smokeless tobacco: Never Used  . Alcohol use No    Family History  Problem Relation Age of Onset  . Heart attack Mother   . Colon cancer Father   . Diabetes Brother   . Heart attack Brother 61    MI     Review of Systems  Constitutional: Negative.   HENT: Negative.   Eyes: Negative.   Respiratory: Negative.   Cardiovascular: Negative.   Gastrointestinal: Negative.   Genitourinary: Negative.   Musculoskeletal: Positive for joint pain.  Skin: Negative.   Neurological: Positive for headaches.  Endo/Heme/Allergies: Negative.   Psychiatric/Behavioral: Negative.     Objective:  Physical Exam  Vitals reviewed. Constitutional: She is oriented to person, place, and time. She appears well-developed and well-nourished.  HENT:  Head: Normocephalic and atraumatic.  Eyes: Conjunctivae and EOM are normal. Pupils are equal, round, and reactive to light.  Neck: Normal range of motion. Neck supple.  Cardiovascular: Normal rate, regular rhythm and intact distal pulses.   Respiratory: Effort normal. No respiratory distress.  GI: Soft. Bowel sounds are normal. She exhibits no distension.  Genitourinary:  Genitourinary Comments: deferred  Musculoskeletal:       Left hip: She exhibits decreased range of motion, decreased strength and crepitus.       Legs: Neurological: She is alert and oriented to person, place, and time. She has normal reflexes.  Skin: Skin is warm and dry.  Psychiatric: She has a normal mood and affect. Her behavior is normal. Judgment and thought content normal.    Vital signs in last 24  hours: @  Labs:   Estimated body mass index is 22.07 kg/m as calculated from the following:   Height as of 12/15/15: 5' 2.5" (1.588 m).   Weight as of 12/16/15: 55.6 kg (122 lb 9.6 oz).   Imaging Review Plain radiographs demonstrate severe degenerative joint disease of the left hip(s). The bone quality appears to be adequate for age and reported activity level. Retained IM nail.  Assessment/Plan:  End stage arthritis, left hip(s)  The patient history, physical examination, clinical judgement of the provider and imaging studies are consistent with end stage degenerative joint disease of the left hip(s) and total hip arthroplasty is deemed medically necessary. The treatment options including medical management, injection therapy, arthroscopy and arthroplasty were discussed at length. The risks and benefits of total hip arthroplasty were presented and reviewed. The risks due to aseptic loosening, infection, stiffness, dislocation/subluxation,  thromboembolic complications and other imponderables were discussed.  The patient acknowledged the explanation, agreed to proceed with the plan and consent was signed. Patient is being admitted for inpatient treatment for surgery, pain control, PT, OT, prophylactic antibiotics, VTE prophylaxis, progressive ambulation and ADL's and discharge planning.The patient is planning to be discharged home with home health  services

## 2016-05-26 NOTE — Progress Notes (Addendum)
PCP: [facility]  Cardiologist: Pt denies  EKG: 12/2015 in [facility]  Stress test: over 5 years ago  ECHO: 03/2015  Cardiac Cath: pt denies ever  Chest x-ray: 12/2015 in [facility]

## 2016-05-26 NOTE — Pre-Procedure Instructions (Signed)
[name]  05/26/2016      [facility], [place] - [place], [place] - [address] [place] [address] Phone: [phone] Fax: [phone]   Your procedure is scheduled on May 7 at 300 PM. Report to [facility] Admitting at 100 PM.   Call this number if you have problems the morning of surgery:[phone]   Remember:  Do not eat food or drink liquids after midnight.  Take these medicines the morning of surgery with A SIP OF WATER amlodipine (norvasc), metoprolol (lopressor), pantprazole (protonix), sertraline (zoloft).  7 days prior to surgery STOP taking any Aspirin, Aleve, Naproxen, Ibuprofen, Motrin, Advil, Goody's, BC's, all herbal medications, fish oil, and all vitamins   Do not wear jewelry, make-up or nail polish.  Do not wear lotions, powders, or perfumes, or deoderant.  Do not shave 48 hours prior to surgery.   Do not bring valuables to the hospital.   [facility] is not responsible for any belongings or valuables.  Contacts, dentures or bridgework may not be worn into surgery.  Leave your suitcase in the car.  After surgery it may be brought to your room.  For patients admitted to the hospital, discharge time will be determined by your treatment team.  Patients discharged the day of surgery will not be allowed to drive home.   Special instructions:  [facility]- Preparing For Surgery  Before surgery, you can play an important role. Because skin is not sterile, your skin needs to be as free of germs as possible. You can reduce the number of germs on your skin by washing with CHG (chlorahexidine gluconate) Soap before surgery.  CHG is an antiseptic cleaner which kills germs and bonds with the skin to continue killing germs even after washing.  Please do not use if you have an allergy to CHG or antibacterial soaps. If your skin becomes reddened/irritated stop using the CHG.  Do not shave (including legs and underarms)  for at least 48 hours prior to first CHG shower. It is OK to shave your face.  Please follow these instructions carefully.   1. Shower the NIGHT BEFORE SURGERY and the MORNING OF SURGERY with CHG.   2. If you chose to wash your hair, wash your hair first as usual with your normal shampoo.  3. After you shampoo, rinse your hair and body thoroughly to remove the shampoo.  4. Use CHG as you would any other liquid soap. You can apply CHG directly to the skin and wash gently with a scrungie or a clean washcloth.   5. Apply the CHG Soap to your body ONLY FROM THE NECK DOWN.  Do not use on open wounds or open sores. Avoid contact with your eyes, ears, mouth and genitals (private parts). Wash genitals (private parts) with your normal soap.  6. Wash thoroughly, paying special attention to the area where your surgery will be performed.  7. Thoroughly rinse your body with warm water from the neck down.  8. DO NOT shower/wash with your normal soap after using and rinsing off the CHG Soap.  9. Pat yourself dry with a CLEAN TOWEL.   10. Wear CLEAN PAJAMAS   11. Place CLEAN SHEETS on your bed the night of your first shower and DO NOT SLEEP WITH PETS.    Day of Surgery: Do not apply any deodorants/lotions. Please wear clean clothes to the hospital/surgery center.     Please read over the following fact  sheets that you were given. Pain Booklet, Coughing and Deep Breathing, MRSA Information and Surgical Site Infection Prevention

## 2016-05-27 ENCOUNTER — Encounter (HOSPITAL_COMMUNITY)
Admission: RE | Admit: 2016-05-27 | Discharge: 2016-05-27 | Disposition: A | Discharge status: Home / Self Care | Payer: Medicare HMO | Source: Ambulatory Visit | Attending: Orthopedic Surgery | Admitting: Orthopedic Surgery

## 2016-05-27 DIAGNOSIS — M1612 Unilateral primary osteoarthritis, left hip: Secondary | ICD-10-CM | POA: Diagnosis not present

## 2016-05-27 DIAGNOSIS — F429 Obsessive-compulsive disorder, unspecified: Secondary | ICD-10-CM | POA: Diagnosis not present

## 2016-05-27 DIAGNOSIS — Z79899 Other long term (current) drug therapy: Secondary | ICD-10-CM | POA: Insufficient documentation

## 2016-05-27 DIAGNOSIS — F419 Anxiety disorder, unspecified: Secondary | ICD-10-CM | POA: Insufficient documentation

## 2016-05-27 DIAGNOSIS — I10 Essential (primary) hypertension: Secondary | ICD-10-CM | POA: Diagnosis not present

## 2016-05-27 DIAGNOSIS — K219 Gastro-esophageal reflux disease without esophagitis: Secondary | ICD-10-CM | POA: Diagnosis not present

## 2016-05-27 DIAGNOSIS — Z01812 Encounter for preprocedural laboratory examination: Secondary | ICD-10-CM | POA: Insufficient documentation

## 2016-05-27 DIAGNOSIS — D649 Anemia, unspecified: Secondary | ICD-10-CM | POA: Insufficient documentation

## 2016-05-27 DIAGNOSIS — Z7982 Long term (current) use of aspirin: Secondary | ICD-10-CM | POA: Insufficient documentation

## 2016-05-27 DIAGNOSIS — F329 Major depressive disorder, single episode, unspecified: Secondary | ICD-10-CM | POA: Insufficient documentation

## 2016-05-27 LAB — BASIC METABOLIC PANEL
ANION GAP: 11 (ref 5–15)
BUN: 19 mg/dL (ref 6–20)
CALCIUM: 9.3 mg/dL (ref 8.9–10.3)
CO2: 17 mmol/L — AB (ref 22–32)
CREATININE: 1.2 mg/dL — AB (ref 0.44–1.00)
Chloride: 112 mmol/L — ABNORMAL HIGH (ref 101–111)
GFR calc Af Amer: 51 mL/min — ABNORMAL LOW (ref >60)
GFR, EST NON AFRICAN AMERICAN: 44 mL/min — AB (ref >60)
GLUCOSE: 108 mg/dL — AB (ref 65–99)
Potassium: 4.1 mmol/L (ref 3.5–5.1)
Sodium: 140 mmol/L (ref 135–145)

## 2016-05-27 LAB — CBC
HCT: 36.6 % (ref 36.0–46.0)
Hemoglobin: 11.8 g/dL — ABNORMAL LOW (ref 12.0–15.0)
MCH: 30.4 pg (ref 26.0–34.0)
MCHC: 32.2 g/dL (ref 30.0–36.0)
MCV: 94.3 fL (ref 78.0–100.0)
PLATELETS: 337 10*3/uL (ref 150–400)
RBC: 3.88 MIL/uL (ref 3.87–5.11)
RDW: 17.8 % — AB (ref 11.5–15.5)
WBC: 7.3 10*3/uL (ref 4.0–10.5)

## 2016-05-27 LAB — SURGICAL PCR SCREEN
MRSA, PCR: NEGATIVE
STAPHYLOCOCCUS AUREUS: POSITIVE — AB

## 2016-05-27 MED ORDER — CHLORHEXIDINE GLUCONATE 4 % EX LIQD: 60.0000 mL | Freq: Once | CUTANEOUS | Status: DC

## 2016-05-31 NOTE — Progress Notes (Signed)
Anesthesia chart review: Patient is a 71 year old female scheduled for left THA with hardware removal on 06/06/2016 by Dr. Linna Caprice.  History includes never smoker, OCD, anxiety, depression, anemia, MRSA, anemia, GERD, HTN, cholecystectomy, hysterectomy, appendectomy, left hip IM nail 12/25/14, hardware removal of left ankle with I&D/wound VAC 02/27/15. Evaluation for atypical chest pain (reproducible with palpation with no exertional chest pain or SOB, negative troponin X 3) 12/2015. She evaluated by cardiology and seen by Robbie Lis, PA-C/Dr. Rennis Golden. Dr. Rennis Golden did not think there was any indication to repeat echo (as last echo was in 03/2015). She was discharged home with medication for chest wall pain and was told she would not need cardiology follow-up unless symptoms persisted. (She had two previous cardiology evaluations by cardiology (Dr. Peter Swaziland) in 2013 for chest pain and pre-syncope and by in 2016 (Dr. Cassell Clement) for atypical chest pain. She had a normal stress echo and SR/ST on event monitor in 2013 and unremarkable echo in 2016.    PCP is with [facility]. Emilio Math, PA-C signed a medical clearance note. Dentist Dr. Ethelene Hal initially recommended postponing until tooth #10 extracted and #31 restored. This has since been done, and dental clearance note also signed.   Meds include amlodipine, aspirin 325 mg, Lopressor, Protonix, Zoloft.  BP (!) 145/42   Pulse 85   Temp 36.6 C   Resp 20   Ht  (1.575 m)   Wt 120 lb 3.2 oz (54.5 kg)   SpO2 98%   BMI 21.98 kg/m   EKG 12/16/15: NSR.  Echo 03/04/15: Study Conclusions - Left ventricle: The cavity size was normal. Wall thickness was   normal. Systolic function was normal. The estimated ejection   fraction was in the range of 55% to 60%. Wall motion was normal;   there were no regional wall motion abnormalities. Doppler   parameters are consistent with abnormal left ventricular   relaxation (grade 1 diastolic  dysfunction). Doppler parameters   are consistent with high ventricular filling pressure. - Mitral valve: Calcified annulus. There was mild regurgitation. - Left atrium: The atrium was mildly dilated. Impressions: - Compared to the prior study, there has been no significant   interval change.  Event monitor 07/31/11-08/29/11: Sinus rhythm - sinus tachycardia.  Stress echo 07/22/11: Study Conclusions - Stress ECG conclusions: There were no stress arrhythmias or conduction abnormalities. The stress ECG was negative for ischemia. - Staged echo: There was no echocardiographic evidence for stress-induced ischemia.  CXR 12/15/15: IMPRESSION: Chronic bronchitic changes and bibasilar scarring. No acute abnormalities.  Preoperative labs noted. Cr 1.20 (previously 0.98-1.34 since 11/2015). H/H 11.8/36.6. Glucose 108. A1c 12/15/15 was 4.9. T&S done.   If no acute changes then I anticipate that she can proceed as planned.   Velna Ochs [facility] Short Stay Center/Anesthesiology Phone [phone] 05/31/2016 2:28 PM

## 2016-06-03 MED ORDER — CEFAZOLIN SODIUM-DEXTROSE 2-4 GM/100ML-% IV SOLN
2.0000 g | INTRAVENOUS | Status: AC
  Administered 2016-06-06: 2 g via INTRAVENOUS
  Filled 2016-06-03: qty 100

## 2016-06-03 MED ORDER — SODIUM CHLORIDE 0.9 % IV SOLN: INTRAVENOUS | Status: DC

## 2016-06-03 MED ORDER — ACETAMINOPHEN 10 MG/ML IV SOLN
1000.0000 mg | INTRAVENOUS | Status: AC
  Administered 2016-06-06: 1000 mg via INTRAVENOUS

## 2016-06-03 MED ORDER — SODIUM CHLORIDE 0.9 % IV SOLN
1000.0000 mg | INTRAVENOUS | Status: AC
  Administered 2016-06-06: 1000 mg via INTRAVENOUS
  Filled 2016-06-03: qty 10

## 2016-06-06 ENCOUNTER — Inpatient Hospital Stay (HOSPITAL_COMMUNITY): Payer: Medicare HMO | Admitting: Emergency Medicine

## 2016-06-06 ENCOUNTER — Inpatient Hospital Stay (HOSPITAL_COMMUNITY): Payer: Medicare HMO

## 2016-06-06 ENCOUNTER — Encounter (HOSPITAL_COMMUNITY): Payer: Self-pay | Admitting: Anesthesiology

## 2016-06-06 ENCOUNTER — Inpatient Hospital Stay (HOSPITAL_COMMUNITY): Payer: Medicare HMO | Admitting: Anesthesiology

## 2016-06-06 ENCOUNTER — Inpatient Hospital Stay (HOSPITAL_COMMUNITY)
Admission: RE | Admit: 2016-06-06 | Discharge: 2016-06-09 | DRG: 470 | Disposition: A | Discharge status: Home Health | Payer: Medicare HMO | Source: Ambulatory Visit | Attending: Orthopedic Surgery | Admitting: Orthopedic Surgery

## 2016-06-06 ENCOUNTER — Encounter (HOSPITAL_COMMUNITY)
Admission: RE | Disposition: A | Discharge status: Home Health | Payer: Self-pay | Source: Ambulatory Visit | Attending: Orthopedic Surgery

## 2016-06-06 DIAGNOSIS — T8484XA Pain due to internal orthopedic prosthetic devices, implants and grafts, initial encounter: Secondary | ICD-10-CM | POA: Diagnosis present

## 2016-06-06 DIAGNOSIS — Z8249 Family history of ischemic heart disease and other diseases of the circulatory system: Secondary | ICD-10-CM

## 2016-06-06 DIAGNOSIS — Z90710 Acquired absence of both cervix and uterus: Secondary | ICD-10-CM | POA: Diagnosis not present

## 2016-06-06 DIAGNOSIS — I10 Essential (primary) hypertension: Secondary | ICD-10-CM | POA: Diagnosis present

## 2016-06-06 DIAGNOSIS — F429 Obsessive-compulsive disorder, unspecified: Secondary | ICD-10-CM | POA: Diagnosis present

## 2016-06-06 DIAGNOSIS — K219 Gastro-esophageal reflux disease without esophagitis: Secondary | ICD-10-CM | POA: Diagnosis present

## 2016-06-06 DIAGNOSIS — Z8781 Personal history of (healed) traumatic fracture: Secondary | ICD-10-CM

## 2016-06-06 DIAGNOSIS — D62 Acute posthemorrhagic anemia: Secondary | ICD-10-CM | POA: Diagnosis not present

## 2016-06-06 DIAGNOSIS — Z8614 Personal history of Methicillin resistant Staphylococcus aureus infection: Secondary | ICD-10-CM | POA: Diagnosis not present

## 2016-06-06 DIAGNOSIS — Z800 Family history of malignant neoplasm of digestive organs: Secondary | ICD-10-CM | POA: Diagnosis not present

## 2016-06-06 DIAGNOSIS — Z882 Allergy status to sulfonamides status: Secondary | ICD-10-CM | POA: Diagnosis not present

## 2016-06-06 DIAGNOSIS — Z833 Family history of diabetes mellitus: Secondary | ICD-10-CM

## 2016-06-06 DIAGNOSIS — Z419 Encounter for procedure for purposes other than remedying health state, unspecified: Secondary | ICD-10-CM

## 2016-06-06 DIAGNOSIS — M879 Osteonecrosis, unspecified: Principal | ICD-10-CM | POA: Diagnosis present

## 2016-06-06 DIAGNOSIS — Z09 Encounter for follow-up examination after completed treatment for conditions other than malignant neoplasm: Secondary | ICD-10-CM

## 2016-06-06 DIAGNOSIS — E7800 Pure hypercholesterolemia, unspecified: Secondary | ICD-10-CM | POA: Diagnosis present

## 2016-06-06 DIAGNOSIS — M25552 Pain in left hip: Secondary | ICD-10-CM | POA: Diagnosis present

## 2016-06-06 DIAGNOSIS — M87052 Idiopathic aseptic necrosis of left femur: Secondary | ICD-10-CM | POA: Diagnosis present

## 2016-06-06 HISTORY — PX: TOTAL HIP ARTHROPLASTY WITH HARDWARE REMOVAL: SHX6438

## 2016-06-06 SURGERY — REVISION, ARTHROPLASTY, HIP
Anesthesia: General | Site: Hip | Laterality: Left

## 2016-06-06 MED ORDER — METHOCARBAMOL 500 MG PO TABS
500.0000 mg | ORAL_TABLET | Freq: Four times a day (QID) | ORAL | Status: DC | PRN
  Administered 2016-06-06 – 2016-06-09 (×7): 500 mg via ORAL
  Filled 2016-06-06 (×6): qty 1

## 2016-06-06 MED ORDER — HYDROMORPHONE HCL 1 MG/ML IJ SOLN
INTRAMUSCULAR | Status: DC | PRN
  Administered 2016-06-06 (×2): 0.5 mg via INTRAVENOUS

## 2016-06-06 MED ORDER — KETOROLAC TROMETHAMINE 30 MG/ML IJ SOLN
INTRAMUSCULAR | Status: AC
  Filled 2016-06-06: qty 1

## 2016-06-06 MED ORDER — HYDROMORPHONE HCL 1 MG/ML IJ SOLN
0.5000 mg | INTRAMUSCULAR | Status: DC | PRN
  Administered 2016-06-06 – 2016-06-07 (×2): 1 mg via INTRAVENOUS
  Administered 2016-06-07 (×2): 0.5 mg via INTRAVENOUS
  Administered 2016-06-08: 1 mg via INTRAVENOUS
  Administered 2016-06-08: 0.5 mg via INTRAVENOUS
  Administered 2016-06-08: 1 mg via INTRAVENOUS
  Filled 2016-06-06 (×7): qty 1

## 2016-06-06 MED ORDER — METHOCARBAMOL 500 MG PO TABS
ORAL_TABLET | ORAL | Status: AC
  Administered 2016-06-06: 500 mg via ORAL
  Filled 2016-06-06: qty 1

## 2016-06-06 MED ORDER — FENTANYL CITRATE (PF) 100 MCG/2ML IJ SOLN
INTRAMUSCULAR | Status: DC | PRN
  Administered 2016-06-06 (×5): 50 ug via INTRAVENOUS

## 2016-06-06 MED ORDER — FENTANYL CITRATE (PF) 100 MCG/2ML IJ SOLN
25.0000 ug | INTRAMUSCULAR | Status: DC | PRN
  Administered 2016-06-06 (×2): 50 ug via INTRAVENOUS

## 2016-06-06 MED ORDER — CALCIUM CARBONATE-VITAMIN D 500-200 MG-UNIT PO TABS
1.0000 | ORAL_TABLET | Freq: Every day | ORAL | Status: DC
  Administered 2016-06-07 – 2016-06-09 (×3): 1 via ORAL
  Filled 2016-06-06 (×4): qty 1

## 2016-06-06 MED ORDER — DEXAMETHASONE SODIUM PHOSPHATE 10 MG/ML IJ SOLN
INTRAMUSCULAR | Status: AC
  Filled 2016-06-06: qty 1

## 2016-06-06 MED ORDER — AMLODIPINE BESYLATE 5 MG PO TABS
5.0000 mg | ORAL_TABLET | Freq: Every day | ORAL | Status: DC
  Administered 2016-06-07 – 2016-06-09 (×3): 5 mg via ORAL
  Filled 2016-06-06 (×3): qty 1

## 2016-06-06 MED ORDER — APIXABAN 2.5 MG PO TABS
2.5000 mg | ORAL_TABLET | Freq: Two times a day (BID) | ORAL | Status: DC
  Administered 2016-06-07 – 2016-06-09 (×5): 2.5 mg via ORAL
  Filled 2016-06-06 (×5): qty 1

## 2016-06-06 MED ORDER — MEPERIDINE HCL 25 MG/ML IJ SOLN: 6.2500 mg | INTRAMUSCULAR | Status: DC | PRN

## 2016-06-06 MED ORDER — ALBUMIN HUMAN 5 % IV SOLN
INTRAVENOUS | Status: DC | PRN
  Administered 2016-06-06 (×2): via INTRAVENOUS

## 2016-06-06 MED ORDER — PHENOL 1.4 % MT LIQD: 1.0000 | OROMUCOSAL | Status: DC | PRN

## 2016-06-06 MED ORDER — ARTIFICIAL TEARS OPHTHALMIC OINT
TOPICAL_OINTMENT | OPHTHALMIC | Status: AC
  Filled 2016-06-06: qty 3.5

## 2016-06-06 MED ORDER — ARTIFICIAL TEARS OPHTHALMIC OINT
TOPICAL_OINTMENT | OPHTHALMIC | Status: DC | PRN
  Administered 2016-06-06: 1 via OPHTHALMIC

## 2016-06-06 MED ORDER — FENTANYL CITRATE (PF) 100 MCG/2ML IJ SOLN
INTRAMUSCULAR | Status: AC
  Administered 2016-06-06: 50 ug via INTRAVENOUS
  Filled 2016-06-06: qty 2

## 2016-06-06 MED ORDER — ROCURONIUM BROMIDE 10 MG/ML (PF) SYRINGE
PREFILLED_SYRINGE | INTRAVENOUS | Status: AC
  Filled 2016-06-06: qty 5

## 2016-06-06 MED ORDER — TRANEXAMIC ACID 1000 MG/10ML IV SOLN
1000.0000 mg | Freq: Once | INTRAVENOUS | Status: DC
  Filled 2016-06-06: qty 10

## 2016-06-06 MED ORDER — LACTATED RINGERS IV SOLN: INTRAVENOUS | Status: DC | PRN

## 2016-06-06 MED ORDER — ACETAMINOPHEN 10 MG/ML IV SOLN
INTRAVENOUS | Status: AC
  Filled 2016-06-06: qty 100

## 2016-06-06 MED ORDER — METOCLOPRAMIDE HCL 5 MG PO TABS
5.0000 mg | ORAL_TABLET | Freq: Three times a day (TID) | ORAL | Status: DC | PRN
  Administered 2016-06-08: 10 mg via ORAL
  Filled 2016-06-06: qty 2

## 2016-06-06 MED ORDER — SENNA 8.6 MG PO TABS
2.0000 | ORAL_TABLET | Freq: Every day | ORAL | Status: DC
  Administered 2016-06-07 – 2016-06-08 (×2): 17.2 mg via ORAL
  Filled 2016-06-06 (×3): qty 2

## 2016-06-06 MED ORDER — SERTRALINE HCL 100 MG PO TABS
100.0000 mg | ORAL_TABLET | Freq: Every day | ORAL | Status: DC
  Administered 2016-06-07 – 2016-06-09 (×3): 100 mg via ORAL
  Filled 2016-06-06 (×3): qty 1

## 2016-06-06 MED ORDER — CEFAZOLIN SODIUM-DEXTROSE 1-4 GM/50ML-% IV SOLN
1.0000 g | Freq: Three times a day (TID) | INTRAVENOUS | Status: AC
  Administered 2016-06-06 – 2016-06-07 (×2): 1 g via INTRAVENOUS
  Filled 2016-06-06 (×2): qty 50

## 2016-06-06 MED ORDER — BUPIVACAINE-EPINEPHRINE (PF) 0.5% -1:200000 IJ SOLN
INTRAMUSCULAR | Status: AC
  Filled 2016-06-06: qty 30

## 2016-06-06 MED ORDER — METOCLOPRAMIDE HCL 5 MG/ML IJ SOLN
5.0000 mg | Freq: Three times a day (TID) | INTRAMUSCULAR | Status: DC | PRN
  Administered 2016-06-07: 10 mg via INTRAVENOUS
  Administered 2016-06-09: 5 mg via INTRAVENOUS
  Filled 2016-06-06 (×2): qty 2

## 2016-06-06 MED ORDER — DEXAMETHASONE SODIUM PHOSPHATE 10 MG/ML IJ SOLN
10.0000 mg | Freq: Once | INTRAMUSCULAR | Status: AC
  Administered 2016-06-07: 10 mg via INTRAVENOUS
  Filled 2016-06-06: qty 1

## 2016-06-06 MED ORDER — SUGAMMADEX SODIUM 200 MG/2ML IV SOLN
INTRAVENOUS | Status: DC | PRN
  Administered 2016-06-06: 200 mg via INTRAVENOUS

## 2016-06-06 MED ORDER — LACTATED RINGERS IV SOLN
INTRAVENOUS | Status: DC | PRN
  Administered 2016-06-06: 15:00:00 via INTRAVENOUS

## 2016-06-06 MED ORDER — MIDAZOLAM HCL 5 MG/5ML IJ SOLN
INTRAMUSCULAR | Status: DC | PRN
  Administered 2016-06-06 (×2): 1 mg via INTRAVENOUS

## 2016-06-06 MED ORDER — BUPIVACAINE-EPINEPHRINE 0.5% -1:200000 IJ SOLN
INTRAMUSCULAR | Status: DC | PRN
  Administered 2016-06-06: 30 mL

## 2016-06-06 MED ORDER — HYDROMORPHONE HCL 1 MG/ML IJ SOLN
INTRAMUSCULAR | Status: AC
  Filled 2016-06-06: qty 1

## 2016-06-06 MED ORDER — METOCLOPRAMIDE HCL 5 MG/ML IJ SOLN: 10.0000 mg | Freq: Once | INTRAMUSCULAR | Status: DC | PRN

## 2016-06-06 MED ORDER — LIDOCAINE 2% (20 MG/ML) 5 ML SYRINGE
INTRAMUSCULAR | Status: AC
  Filled 2016-06-06: qty 5

## 2016-06-06 MED ORDER — POLYETHYLENE GLYCOL 3350 17 G PO PACK: 17.0000 g | PACK | Freq: Every day | ORAL | Status: DC | PRN

## 2016-06-06 MED ORDER — PHENYLEPHRINE 40 MCG/ML (10ML) SYRINGE FOR IV PUSH (FOR BLOOD PRESSURE SUPPORT)
PREFILLED_SYRINGE | INTRAVENOUS | Status: AC
  Filled 2016-06-06: qty 10

## 2016-06-06 MED ORDER — LIDOCAINE HCL (CARDIAC) 20 MG/ML IV SOLN
INTRAVENOUS | Status: DC | PRN
  Administered 2016-06-06: 60 mg via INTRAVENOUS

## 2016-06-06 MED ORDER — MENTHOL 3 MG MT LOZG: 1.0000 | LOZENGE | OROMUCOSAL | Status: DC | PRN

## 2016-06-06 MED ORDER — HYDROCODONE-ACETAMINOPHEN 5-325 MG PO TABS
1.0000 | ORAL_TABLET | ORAL | Status: DC | PRN
  Administered 2016-06-06 – 2016-06-09 (×13): 2 via ORAL
  Filled 2016-06-06 (×12): qty 2

## 2016-06-06 MED ORDER — METHOCARBAMOL 1000 MG/10ML IJ SOLN
500.0000 mg | Freq: Four times a day (QID) | INTRAVENOUS | Status: DC | PRN
  Filled 2016-06-06: qty 5

## 2016-06-06 MED ORDER — EPHEDRINE 5 MG/ML INJ
INTRAVENOUS | Status: AC
  Filled 2016-06-06: qty 10

## 2016-06-06 MED ORDER — ONDANSETRON HCL 4 MG/2ML IJ SOLN
INTRAMUSCULAR | Status: DC | PRN
  Administered 2016-06-06: 4 mg via INTRAVENOUS

## 2016-06-06 MED ORDER — 0.9 % SODIUM CHLORIDE (POUR BTL) OPTIME
TOPICAL | Status: DC | PRN
  Administered 2016-06-06: 1000 mL

## 2016-06-06 MED ORDER — PANTOPRAZOLE SODIUM 40 MG PO TBEC
40.0000 mg | DELAYED_RELEASE_TABLET | Freq: Every day | ORAL | Status: DC
  Administered 2016-06-07 – 2016-06-09 (×3): 40 mg via ORAL
  Filled 2016-06-06 (×3): qty 1

## 2016-06-06 MED ORDER — POVIDONE-IODINE 10 % EX SWAB: 2.0000 "application " | Freq: Once | CUTANEOUS | Status: DC

## 2016-06-06 MED ORDER — METOPROLOL TARTRATE 25 MG PO TABS
25.0000 mg | ORAL_TABLET | Freq: Two times a day (BID) | ORAL | Status: DC
  Administered 2016-06-06 – 2016-06-09 (×6): 25 mg via ORAL
  Filled 2016-06-06 (×6): qty 1

## 2016-06-06 MED ORDER — DEXTROSE 5 % IV SOLN
INTRAVENOUS | Status: DC | PRN
  Administered 2016-06-06: 16:00:00 via INTRAVENOUS

## 2016-06-06 MED ORDER — ROCURONIUM BROMIDE 100 MG/10ML IV SOLN
INTRAVENOUS | Status: DC | PRN
  Administered 2016-06-06 (×2): 10 mg via INTRAVENOUS
  Administered 2016-06-06: 50 mg via INTRAVENOUS

## 2016-06-06 MED ORDER — KETOROLAC TROMETHAMINE 30 MG/ML IJ SOLN
INTRAMUSCULAR | Status: DC | PRN
  Administered 2016-06-06: 30 mg via INTRA_ARTICULAR

## 2016-06-06 MED ORDER — SODIUM CHLORIDE 0.9 % IR SOLN
Status: DC | PRN
  Administered 2016-06-06: 250 mL
  Administered 2016-06-06: 1000 mL

## 2016-06-06 MED ORDER — ACETAMINOPHEN 650 MG RE SUPP: 650.0000 mg | Freq: Four times a day (QID) | RECTAL | Status: DC | PRN

## 2016-06-06 MED ORDER — HYDROCODONE-ACETAMINOPHEN 5-325 MG PO TABS
ORAL_TABLET | ORAL | Status: AC
  Administered 2016-06-06: 2 via ORAL
  Filled 2016-06-06: qty 2

## 2016-06-06 MED ORDER — ONDANSETRON HCL 4 MG/2ML IJ SOLN
4.0000 mg | Freq: Four times a day (QID) | INTRAMUSCULAR | Status: DC | PRN
  Administered 2016-06-06 – 2016-06-09 (×4): 4 mg via INTRAVENOUS
  Filled 2016-06-06 (×4): qty 2

## 2016-06-06 MED ORDER — SODIUM CHLORIDE 0.9 % IJ SOLN
INTRAMUSCULAR | Status: DC | PRN
  Administered 2016-06-06: 30 mL

## 2016-06-06 MED ORDER — PROPOFOL 10 MG/ML IV BOLUS
INTRAVENOUS | Status: DC | PRN
  Administered 2016-06-06: 100 mg via INTRAVENOUS

## 2016-06-06 MED ORDER — PROPOFOL 10 MG/ML IV BOLUS
INTRAVENOUS | Status: AC
  Filled 2016-06-06: qty 20

## 2016-06-06 MED ORDER — ONDANSETRON HCL 4 MG PO TABS: 4.0000 mg | ORAL_TABLET | Freq: Four times a day (QID) | ORAL | Status: DC | PRN

## 2016-06-06 MED ORDER — SUGAMMADEX SODIUM 200 MG/2ML IV SOLN
INTRAVENOUS | Status: AC
  Filled 2016-06-06: qty 2

## 2016-06-06 MED ORDER — FENTANYL CITRATE (PF) 250 MCG/5ML IJ SOLN
INTRAMUSCULAR | Status: AC
  Filled 2016-06-06: qty 5

## 2016-06-06 MED ORDER — MIDAZOLAM HCL 2 MG/2ML IJ SOLN
INTRAMUSCULAR | Status: AC
  Filled 2016-06-06: qty 2

## 2016-06-06 MED ORDER — DEXAMETHASONE SODIUM PHOSPHATE 10 MG/ML IJ SOLN
INTRAMUSCULAR | Status: DC | PRN
  Administered 2016-06-06: 10 mg via INTRAVENOUS

## 2016-06-06 MED ORDER — DIPHENHYDRAMINE HCL 12.5 MG/5ML PO ELIX: 12.5000 mg | ORAL_SOLUTION | ORAL | Status: DC | PRN

## 2016-06-06 MED ORDER — SODIUM CHLORIDE 0.9 % IV SOLN
INTRAVENOUS | Status: DC
  Administered 2016-06-06 – 2016-06-08 (×4): via INTRAVENOUS

## 2016-06-06 MED ORDER — DOCUSATE SODIUM 100 MG PO CAPS
100.0000 mg | ORAL_CAPSULE | Freq: Two times a day (BID) | ORAL | Status: DC
  Administered 2016-06-07 – 2016-06-09 (×5): 100 mg via ORAL
  Filled 2016-06-06 (×6): qty 1

## 2016-06-06 MED ORDER — ONDANSETRON HCL 4 MG/2ML IJ SOLN
INTRAMUSCULAR | Status: AC
  Filled 2016-06-06: qty 2

## 2016-06-06 MED ORDER — ACETAMINOPHEN 325 MG PO TABS
650.0000 mg | ORAL_TABLET | Freq: Four times a day (QID) | ORAL | Status: DC | PRN
  Filled 2016-06-06: qty 2

## 2016-06-06 SURGICAL SUPPLY — 76 items
ADAPTER BIOLOX TAPER -3 (Miscellaneous) ×2 IMPLANT
ADAPTER BIOLOX TAPER -3MM (Miscellaneous) ×1 IMPLANT
BAG DECANTER FOR FLEXI CONT (MISCELLANEOUS) ×3 IMPLANT
BEARING DUAL 28X40 HIP (Hips) ×2 IMPLANT
BIOLOX DELTA OPTION HIP SYSTEM CERAMIC HEAD 28MM ×3 IMPLANT
BIT DRILL RINGLOC 3.2MMX20 (BIT) ×1 IMPLANT
BIT DRILL RINGLOC 3.2MMX40 (BIT) ×1 IMPLANT
BIT DRILL RINGLOC 3.2X20 (BIT) ×1
BIT DRILL RINGLOC 3.2X40 (BIT) ×1
BIT DRILL RINGLOC QUICK CONN (BIT) ×1 IMPLANT
BLADE SAW SGTL 18X1.27X75 (BLADE) ×2 IMPLANT
BLADE SAW SGTL 18X1.27X75MM (BLADE) ×1
COVER SURGICAL LIGHT HANDLE (MISCELLANEOUS) ×3 IMPLANT
DERMABOND ADVANCED (GAUZE/BANDAGES/DRESSINGS) ×4
DERMABOND ADVANCED .7 DNX12 (GAUZE/BANDAGES/DRESSINGS) ×2 IMPLANT
DRAPE HIP W/POCKET STRL (DRAPE) ×3 IMPLANT
DRAPE INCISE IOBAN 85X60 (DRAPES) ×6 IMPLANT
DRAPE ORTHO SPLIT 77X108 STRL (DRAPES)
DRAPE POUCH INSTRU U-SHP 10X18 (DRAPES) ×3 IMPLANT
DRAPE SURG 17X11 SM STRL (DRAPES) ×3 IMPLANT
DRAPE SURG ORHT 6 SPLT 77X108 (DRAPES) IMPLANT
DRAPE U-SHAPE 47X51 STRL (DRAPES) ×3 IMPLANT
DRILL BIT RINGLOC 3.2MMX20 (BIT) ×2
DRILL BIT RINGLOC 3.2MMX40 (BIT) ×2
DRILL BIT RINGLOC QUICK CONN (BIT) ×2
DRSG AQUACEL AG ADV 3.5X10 (GAUZE/BANDAGES/DRESSINGS) ×3 IMPLANT
DRSG AQUACEL AG ADV 3.5X14 (GAUZE/BANDAGES/DRESSINGS) ×3 IMPLANT
DURAPREP 26ML APPLICATOR (WOUND CARE) ×3 IMPLANT
ELECT BLADE TIP CTD 4 INCH (ELECTRODE) ×3 IMPLANT
ELECT REM PT RETURN 9FT ADLT (ELECTROSURGICAL) ×3
ELECTRODE REM PT RTRN 9FT ADLT (ELECTROSURGICAL) ×1 IMPLANT
FACESHIELD WRAPAROUND (MASK) ×3 IMPLANT
GAUZE SPONGE 2X2 8PLY STRL LF (GAUZE/BANDAGES/DRESSINGS) ×1 IMPLANT
GLOVE BIO SURGEON STRL SZ8.5 (GLOVE) ×9 IMPLANT
GLOVE BIOGEL PI IND STRL 7.5 (GLOVE) ×1 IMPLANT
GLOVE BIOGEL PI IND STRL 8.5 (GLOVE) ×1 IMPLANT
GLOVE BIOGEL PI INDICATOR 7.5 (GLOVE) ×2
GLOVE BIOGEL PI INDICATOR 8.5 (GLOVE) ×2
GLOVE SURG SS PI 8.0 STRL IVOR (GLOVE) ×3 IMPLANT
GOWN SPEC L3 XXLG W/TWL (GOWN DISPOSABLE) ×6 IMPLANT
GOWN STRL REUS W/TWL 2XL LVL3 (GOWN DISPOSABLE) ×3 IMPLANT
HANDPIECE INTERPULSE COAX TIP (DISPOSABLE) ×2
HIP ACETABULAR SCREW 6.5X20MM (Hips) ×6 IMPLANT
HIP SHELL ACETAB 3H 50MM (Hips) ×3 IMPLANT
HOOD W/PEELAWAY (MISCELLANEOUS) ×6 IMPLANT
IV NS 1000ML (IV SOLUTION)
IV NS 1000ML BAXH (IV SOLUTION) IMPLANT
KIT BASIN OR (CUSTOM PROCEDURE TRAY) ×3 IMPLANT
LINER DUAL MOBIL G7 40 (Liner) ×1 IMPLANT
LINER DUAL MOBILITY G7 40MM (Liner) ×1 IMPLANT
MANIFOLD NEPTUNE II (INSTRUMENTS) ×3 IMPLANT
NS IRRIG 1000ML POUR BTL (IV SOLUTION) ×3 IMPLANT
PACK TOTAL JOINT (CUSTOM PROCEDURE TRAY) ×3 IMPLANT
PADDING CAST COTTON 6X4 STRL (CAST SUPPLIES) ×3 IMPLANT
SCREW ACETABULAR G7 6.5X30 (Screw) ×3 IMPLANT
SCREW ACETABULAR HIP 6.5X20MM (Hips) ×2 IMPLANT
SEALER BIPOLAR AQUA 6.0 (INSTRUMENTS) ×3 IMPLANT
SET HNDPC FAN SPRY TIP SCT (DISPOSABLE) ×1 IMPLANT
SHELL ACETAB HIP 3H 50MM (Hips) ×1 IMPLANT
SPONGE GAUZE 2X2 STER 10/PKG (GAUZE/BANDAGES/DRESSINGS) ×2
STEM FEM ARCOS 16X210MM (Stem) ×2 IMPLANT
SUCTION FRAZIER HANDLE 10FR (MISCELLANEOUS) ×2
SUCTION TUBE FRAZIER 10FR DISP (MISCELLANEOUS) ×1 IMPLANT
SUT ETHIBOND NAB CT1 #1 30IN (SUTURE) ×3 IMPLANT
SUT MNCRL AB 3-0 PS2 27 (SUTURE) ×3 IMPLANT
SUT MON AB 2-0 CT1 36 (SUTURE) ×3 IMPLANT
SUT VIC AB 1 CT1 27 (SUTURE) ×6
SUT VIC AB 1 CT1 27XBRD ANBCTR (SUTURE) ×3 IMPLANT
SUT VIC AB 1 CTX 27 (SUTURE) ×3 IMPLANT
SUT VIC AB 2-0 CT1 27 (SUTURE) ×4
SUT VIC AB 2-0 CT1 TAPERPNT 27 (SUTURE) ×2 IMPLANT
SUT VLOC 180 0 24IN GS25 (SUTURE) ×3 IMPLANT
SYR 50ML LL SCALE MARK (SYRINGE) ×3 IMPLANT
TOWEL OR 17X26 10 PK STRL BLUE (TOWEL DISPOSABLE) ×6 IMPLANT
TRAY CATH 16FR W/PLASTIC CATH (SET/KITS/TRAYS/PACK) IMPLANT
TRAY FOLEY W/METER SILVER 16FR (SET/KITS/TRAYS/PACK) ×3 IMPLANT

## 2016-06-06 NOTE — Transfer of Care (Signed)
Immediate Anesthesia Transfer of Care Note  Patient: [name]  Procedure(s) Performed: Procedure(s): LEFT TOTAL HIP ARTHROPLASTY WITH HARDWARE REMOVAL (Left)  Patient Location: PACU  Anesthesia Type:General  Level of Consciousness: awake, oriented, sedated, patient cooperative and responds to stimulation  Airway & Oxygen Therapy: Patient Spontanous Breathing and Patient connected to nasal cannula oxygen  Post-op Assessment: Report given to RN, Post -op Vital signs reviewed and stable, Patient moving all extremities and Patient moving all extremities X 4  Post vital signs: Reviewed and stable  Last Vitals:  Vitals:   06/06/16 1308  BP: (!) 160/52  Pulse: 68  Resp: 20  Temp: 36.8 C    Last Pain:  Vitals:   06/06/16 1307  PainSc: 6       Patients Stated Pain Goal: 4 (06/06/16 1307)  Complications: No apparent anesthesia complications

## 2016-06-06 NOTE — Op Note (Signed)
NAMAnnamary Carolin:  Dutson, Mariyah                ACCOUNT NO.:  [id number]  MEDICAL RECORD NO.:  [id number]  LOCATION:  [id number]                        FACILITY:  [facility]  PHYSICIAN:  Samson FredericBrian Jenita Rayfield, MD     DATE OF BIRTH:  1945/06/15  DATE OF PROCEDURE:  06/06/2016 DATE OF DISCHARGE:                              OPERATIVE REPORT   PREOPERATIVE DIAGNOSES: 1. Retained hardware, left hip. 2. Avascular necrosis, left hip.  POSTOPERATIVE DIAGNOSES: 1. Retained hardware, left hip. 2. Avascular necrosis, left hip.  PROCEDURES PERFORMED: 1. Removal of deep implant from left hip. 2. Conversion of previous surgery to left total hip arthroplasty.  EXPLANTS:  Biomet Affixus short hip fracture nail with cannulated lag screw and distal interlocking screw.  IMPLANTS: 1. Biomet G7 acetabular shell, 3-hole, 50 mm. 2. 6.5-mm cancellous bone screw x2. 3. G7 dual-mobility acetabular liner, size D. 4. Biomet Arcos One-piece standard offset broach body stem, 16 x 210     mm, type 1 taper. 5. 40-mm E1 dual-mobility bearing with 28-mm BIOLOX head ball and -3     tapered neck adapter.  ANESTHESIA:  General.  EBL:  400 mL.  ANTIBIOTICS:  2 g Ancef.  COMPLICATIONS:  None.  TUBES AND DRAINS:  None.  DISPOSITION:  Stable to PACU.  INDICATIONS:  The patient is a 71 year old female, who underwent intramedullary fixation of left intertrochanteric femur fracture by Dr. Victorino DikeHewitt back in 2016.  The fracture went on to heal.  She subsequently developed avascular necrosis in the left hip.  She failed conservative management with activity modification, injections and analgesic medication, she elected to proceed with hardware removal and total hip arthroplasty.  We discussed the risks, benefits, alternatives, she wished to proceed.  DESCRIPTION OF PROCEDURE IN DETAIL:  I identified the patient in the holding area using two identifiers.  The surgical site was marked.  She was taken to the operating room, general  anesthesia was induced.  She was transferred to the operating room table.  She was flipped to the lateral decubitus position.  All bony prominences were well padded.  She was secured to the table and the hip positioner.  Axillary roll was placed.  The left hip was prepped and draped in normal sterile surgical fashion.  Time-out was called verifying site and side of surgery.  She received IV antibiotics within 60 minutes at the beginning of the procedure, I made a standard posterior approach to the hip.  IT band was split in line with fibers.  Charnley retractor was placed.  I identified the sciatic nerve, which was palpated and protected throughout the case. I performed a standard posterior approach to the hip, the short external rotators and capsule were taken down in one layer.  I dislocated the hip.  The hip was then relocated, I then took the time to identify her previous hardware, I identified the tip of the nail, I used a rongeur to clean bony overgrowth off the end of the nail.  I then inserted a screwdriver, I removed the set screw.  I then located her lag screw. The driver was inserted and the lag screw was backed out.  I then made a percutaneous stab incision using  fluoroscopic guidance, I was able to percutaneously remove her distal interlocking screw.  I then turned my attention proximally, I inserted the threaded insertion handle into the proximal end of the nail, I backslapped the nail out without any difficulty.  The hip was then dislocated again.  The neck was protected with Cobras, I made the neck cut.  I then subluxated the femur anteriorly, I placed acetabular retractors, acetabular exposure was achieved, I removed the labrum and the pulvinar with Bovie electrocautery.  I sequentially reamed up to a 49-mm reamer.  A 50-mm cup was opened, impacted into place at approximately 50 degrees of abduction and 25 degrees of anteversion.  The cup achieved excellent press-fit  stability; however, I chose to augment this with two 6.5-mm cancellous screws, which were placed.  I then placed the real liner for the dual-mobility bearing.  I then regained femoral exposure, I used the box osteotome.  I then used canal finder, lateralizing reamer was placed, I then reamed up to a 16 reamer, I broached up to a 16, the real 16-mm stem was impacted into place and then trial heads were placed. The -3 adapter gave [place] excellent soft tissue tension and stability. There was no impingement in external rotation, in full extension.  With neutral abduction and at 90 degrees of flexion, I was able to internally rotate her up to approximately 75 degrees before there was any impingement of the trochanter on the pelvis.  This was deemed acceptable.  The real liner was placed.  The hip was reduced.  Leg lengths were palpated and felt to be roughly equal.  The wound was copiously irrigated with saline.  I closed the capsule with #1 Vicryl. Marcaine was injected at periarticular soft tissues.  The IP band was closed with #1 Vicryl and 0 V-Loc.  Deep fatty tissue was closed with 2- 0 Vicryl, deep dermal layer with 2-0 Monocryl, 3-0 running Monocryl subcuticular stitch, glue for the skin.  Once the glue was dried, Aquacel was placed.  The patient was then flipped supine, extubated, taken to PACU in stable condition.  Sponge, needle and instrument counts were correct at the end of the case x2.  There were no known complications.          ______________________________ Samson Frederic, MD     BS/MEDQ  D:  06/06/2016  T:  06/06/2016  Job:  [id number]

## 2016-06-06 NOTE — Anesthesia Procedure Notes (Signed)
Procedure Name: Intubation Date/Time: 06/06/2016 3:57 PM Performed by: Jacquiline Doe A Pre-anesthesia Checklist: Patient identified, Emergency Drugs available, Suction available and Patient being monitored Patient Re-evaluated:Patient Re-evaluated prior to inductionOxygen Delivery Method: Circle System Utilized and Circle system utilized Preoxygenation: Pre-oxygenation with 100% oxygen Intubation Type: IV induction and Cricoid Pressure applied Ventilation: Mask ventilation without difficulty Laryngoscope Size: Mac and 3 Grade View: Grade I Tube type: Oral Tube size: 7.5 mm Number of attempts: 1 Airway Equipment and Method: Stylet Placement Confirmation: ETT inserted through vocal cords under direct vision,  positive ETCO2 and breath sounds checked- equal and bilateral Secured at: 22 cm Tube secured with: Tape Dental Injury: Teeth and Oropharynx as per pre-operative assessment

## 2016-06-06 NOTE — Interval H&P Note (Signed)
History and Physical Interval Note:  06/06/2016 2:13 PM  [name]  has presented today for surgery, with the diagnosis of Retained hardware Left hip, left hip avascular necrosis   The various methods of treatment have been discussed with the patient and family. After consideration of risks, benefits and other options for treatment, the patient has consented to  Procedure(s): LEFT TOTAL HIP ARTHROPLASTY WITH HARDWARE REMOVAL (Left) as a surgical intervention .  The patient's history has been reviewed, patient examined, no change in status, stable for surgery.  I have reviewed the patient's chart and labs.  Questions were answered to the patient's satisfaction.     Cynia Abruzzo, Cloyde ReamsBrian James

## 2016-06-06 NOTE — Anesthesia Preprocedure Evaluation (Addendum)
Anesthesia Evaluation  Patient identified by MRN, date of birth, ID band Patient awake    Reviewed: Allergy & Precautions, NPO status , Patient's Chart, lab work & pertinent test results  Airway Mallampati: II  TM Distance: >3 FB Neck ROM: Full    Dental no notable dental hx.    Pulmonary shortness of breath,    Pulmonary exam normal breath sounds clear to auscultation       Cardiovascular hypertension, Pt. on medications + angina Normal cardiovascular exam Rhythm:Regular Rate:Normal  Card cleared patient. ECHO noted   Neuro/Psych Anxiety Depression OCDnegative neurological ROS     GI/Hepatic GERD  ,  Endo/Other    Renal/GU Renal disease     Musculoskeletal  (+) Arthritis ,   Abdominal   Peds  Hematology  (+) anemia ,   Anesthesia Other Findings   Reproductive/Obstetrics                            Anesthesia Physical Anesthesia Plan  ASA: III  Anesthesia Plan: General   Post-op Pain Management:    Induction: Intravenous  Airway Management Planned: Oral ETT  Additional Equipment:   Intra-op Plan:   Post-operative Plan: Extubation in OR  Informed Consent: I have reviewed the patients History and Physical, chart, labs and discussed the procedure including the risks, benefits and alternatives for the proposed anesthesia with the patient or authorized representative who has indicated his/her understanding and acceptance.   Dental advisory given  Plan Discussed with: CRNA  Anesthesia Plan Comments:         Anesthesia Quick Evaluation

## 2016-06-06 NOTE — H&P (View-Only) (Signed)
TOTAL HIP ADMISSION H&P  Patient is admitted for left total hip arthroplasty.  Subjective:  Chief Complaint: left hip pain  HPI: [name], 71 y.o. female, has a history of pain and functional disability in the left hip(s) due to trauma and patient has failed non-surgical conservative treatments for greater than 12 weeks to include NSAID's and/or analgesics, corticosteriod injections, supervised PT with diminished ADL's post treatment, use of assistive devices, weight reduction as appropriate and activity modification.  Onset of symptoms was gradual starting 2 years ago with rapidlly worsening course since that time.The patient noted prior procedures of the hip to include IM nail on the right hip(s).  Patient currently rates pain in the left hip at 10 out of 10 with activity. Patient has night pain, worsening of pain with activity and weight bearing, pain that interfers with activities of daily living, pain with passive range of motion and crepitus. 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 This condition presents safety issues increasing the risk of falls. This patient has had proximal femur fracture.  There is no current active infection.  Patient Active Problem List   Diagnosis Date Noted  . Hyperkalemia 12/15/2015  . GERD (gastroesophageal reflux disease) 12/15/2015  . Staphylococcus aureus bacteremia   . Postoperative infection   . Acute blood loss anemia 03/01/2015  . Hip pain, acute   . Postop check   . Wound cellulitis   . Wound dehiscence 02/27/2015  . Tachycardia 02/27/2015  . Deep incisional surgical site infection 02/27/2015  . History of diastolic dysfunction 12/25/2014  . Closed left hip fracture (HCC) 12/25/2014  . History of sinus tachycardia 12/25/2014  . Anxiety 12/25/2014  . PNA (pneumonia) 12/25/2014  . Fracture, intertrochanteric, left femur (HCC) 12/25/2014  .  Abnormal chest x-ray   . Sinus tachycardia 12/16/2014  . Right ventricular hypertrophy 12/16/2014  . Left Trimalleolar fracture with displacement of the ankle joint S/P ORIF 11/26/2014  . Essential hypertension 11/26/2014  . Encounter for central line care 11/26/2014  . HCAP (healthcare-associated pneumonia) 11/26/2014  . Leukocytosis 11/26/2014  . Chronic depression 11/26/2014  . Esophageal reflux 11/26/2014  . Protein-calorie malnutrition (HCC) 11/26/2014  . Chest pain 08/07/2014  . Anemia 08/07/2014  . AKI (acute kidney injury) (HCC) 08/07/2014  . Hyperglycemia 08/07/2014  . Hyponatremia 08/07/2014  . Hypoxia 05/18/2014  . Angina decubitus (HCC) 07/20/2011  . Palpitations 07/20/2011  . Hypercholesterolemia 07/20/2011  . Family history of coronary artery disease 07/20/2011   Past Medical History:  Diagnosis Date  . Anemia   . Anxiety   . Arthritis   . Azotemia   . Chest pain   . Chicken pox   . Depressive disorder   . Dizziness   . Fall 06/15/2011   Caused discomfort in lower left rib and left lateral hip area  . GERD (gastroesophageal reflux disease)   . H/O: hysterectomy 1985  . History of MRSA infection   . Hypertension   . Hypokalemia   . Left trimalleolar fracture   . Leukocytosis   . OCD (obsessive compulsive disorder)   . Opioid abuse   . Pre-syncope   . Respiratory failure (HCC)   . Severe sepsis (HCC)   . SOB (shortness of breath) on exertion     Past Surgical History:  Procedure Laterality Date  . APPENDECTOMY    . APPLICATION OF WOUND VAC Left 02/27/2015   Procedure: APPLICATION OF WOUND VAC;  Surgeon: Arlys JohnBrian  Nanako Stopher, MD;  Location: [facility] OR;  Service: Orthopedics;  Laterality: Left;  . GALLBLADDER SURGERY    . HARDWARE REMOVAL Left 02/27/2015   Procedure: HARDWARE REMOVAL OF ANKLE AND I AND D;  Surgeon: Hazley Dezeeuw, MD;  Location: [facility] OR;  Service: Orthopedics;  Laterality: Left;  . INTRAMEDULLARY (IM) NAIL INTERTROCHANTERIC Left 12/25/2014    Procedure: INTRAMEDULLARY (IM) NAIL INTERTROCHANTRIC;  Surgeon: John Hewitt, MD;  Location: [facility] OR;  Service: Orthopedics;  Laterality: Left;  . TOTAL ABDOMINAL HYSTERECTOMY       (Not in a hospital admission) Allergies  Allergen Reactions  . Sulfa Antibiotics Hives    Social History  Substance Use Topics  . Smoking status: Never Smoker  . Smokeless tobacco: Never Used  . Alcohol use No    Family History  Problem Relation Age of Onset  . Heart attack Mother   . Colon cancer Father   . Diabetes Brother   . Heart attack Brother 61    MI     Review of Systems  Constitutional: Negative.   HENT: Negative.   Eyes: Negative.   Respiratory: Negative.   Cardiovascular: Negative.   Gastrointestinal: Negative.   Genitourinary: Negative.   Musculoskeletal: Positive for joint pain.  Skin: Negative.   Neurological: Positive for headaches.  Endo/Heme/Allergies: Negative.   Psychiatric/Behavioral: Negative.     Objective:  Physical Exam  Vitals reviewed. Constitutional: She is oriented to person, place, and time. She appears well-developed and well-nourished.  HENT:  Head: Normocephalic and atraumatic.  Eyes: Conjunctivae and EOM are normal. Pupils are equal, round, and reactive to light.  Neck: Normal range of motion. Neck supple.  Cardiovascular: Normal rate, regular rhythm and intact distal pulses.   Respiratory: Effort normal. No respiratory distress.  GI: Soft. Bowel sounds are normal. She exhibits no distension.  Genitourinary:  Genitourinary Comments: deferred  Musculoskeletal:       Left hip: She exhibits decreased range of motion, decreased strength and crepitus.       Legs: Neurological: She is alert and oriented to person, place, and time. She has normal reflexes.  Skin: Skin is warm and dry.  Psychiatric: She has a normal mood and affect. Her behavior is normal. Judgment and thought content normal.    Vital signs in last 24  hours: @VSRANGES@  Labs:   Estimated body mass index is 22.07 kg/m as calculated from the following:   Height as of 12/15/15: 5' 2.5" (1.588 m).   Weight as of 12/16/15: 55.6 kg (122 lb 9.6 oz).   Imaging Review Plain radiographs demonstrate severe degenerative joint disease of the left hip(s). The bone quality appears to be adequate for age and reported activity level. Retained IM nail.  Assessment/Plan:  End stage arthritis, left hip(s)  The patient history, physical examination, clinical judgement of the provider and imaging studies are consistent with end stage degenerative joint disease of the left hip(s) and total hip arthroplasty is deemed medically necessary. The treatment options including medical management, injection therapy, arthroscopy and arthroplasty were discussed at length. The risks and benefits of total hip arthroplasty were presented and reviewed. The risks due to aseptic loosening, infection, stiffness, dislocation/subluxation,  thromboembolic complications and other imponderables were discussed.  The patient acknowledged the explanation, agreed to proceed with the plan and consent was signed. Patient is being admitted for inpatient treatment for surgery, pain control, PT, OT, prophylactic antibiotics, VTE prophylaxis, progressive ambulation and ADL's and discharge planning.The patient is planning to be discharged home with home health   services

## 2016-06-06 NOTE — Brief Op Note (Signed)
06/06/2016  6:58 PM  PATIENT:  [name]  71 y.o. female  PRE-OPERATIVE DIAGNOSIS:  Retained hardware Left hip, left hip avascular necrosis   POST-OPERATIVE DIAGNOSIS:  Retained hardware Left hip, left hip avascular necrosis   PROCEDURE:  Procedure(s): LEFT TOTAL HIP ARTHROPLASTY WITH HARDWARE REMOVAL (Left)  SURGEON:  Surgeon(s) and Role:    * Nahdia Doucet, Arlys JohnBrian, MD - Primary  PHYSICIAN ASSISTANT: none  ASSISTANTS: justin queen, rnfa   ANESTHESIA:   general  EBL:  Total I/O In: 2600 [I.V.:2100; IV Piggyback:500] Out: 1100 [Urine:700; Blood:400]  BLOOD ADMINISTERED:none  DRAINS: none   LOCAL MEDICATIONS USED:  MARCAINE     SPECIMEN:  No Specimen  DISPOSITION OF SPECIMEN:  N/A  COUNTS:  YES  TOURNIQUET:  * No tourniquets in log *  DICTATION: .Other Dictation: Dictation Number [id number]  PLAN OF CARE: Admit to inpatient   PATIENT DISPOSITION:  PACU - hemodynamically stable.   Delay start of Pharmacological VTE agent (>24hrs) due to surgical blood loss or risk of bleeding: no

## 2016-06-07 LAB — CBC
HEMATOCRIT: 21 % — AB (ref 36.0–46.0)
HEMOGLOBIN: 7 g/dL — AB (ref 12.0–15.0)
MCH: 31.3 pg (ref 26.0–34.0)
MCHC: 33.3 g/dL (ref 30.0–36.0)
MCV: 93.8 fL (ref 78.0–100.0)
Platelets: 233 10*3/uL (ref 150–400)
RBC: 2.24 MIL/uL — AB (ref 3.87–5.11)
RDW: 16.8 % — ABNORMAL HIGH (ref 11.5–15.5)
WBC: 10.1 10*3/uL (ref 4.0–10.5)

## 2016-06-07 LAB — BASIC METABOLIC PANEL
Anion gap: 9 (ref 5–15)
BUN: 15 mg/dL (ref 6–20)
CHLORIDE: 103 mmol/L (ref 101–111)
CO2: 24 mmol/L (ref 22–32)
CREATININE: 0.86 mg/dL (ref 0.44–1.00)
Calcium: 7.9 mg/dL — ABNORMAL LOW (ref 8.9–10.3)
GFR calc Af Amer: >60 mL/min (ref >60)
GFR calc non Af Amer: >60 mL/min (ref >60)
Glucose, Bld: 126 mg/dL — ABNORMAL HIGH (ref 65–99)
POTASSIUM: 3.8 mmol/L (ref 3.5–5.1)
SODIUM: 136 mmol/L (ref 135–145)

## 2016-06-07 MED ORDER — ONDANSETRON HCL 4 MG PO TABS: 4.0000 mg | ORAL_TABLET | Freq: Four times a day (QID) | ORAL | 0 refills | Status: AC | PRN

## 2016-06-07 MED ORDER — APIXABAN 2.5 MG PO TABS: 2.5000 mg | ORAL_TABLET | Freq: Two times a day (BID) | ORAL | 0 refills | Status: DC

## 2016-06-07 MED ORDER — HYDROCODONE-ACETAMINOPHEN 5-325 MG PO TABS: 1.0000 | ORAL_TABLET | ORAL | 0 refills | Status: DC | PRN

## 2016-06-07 MED ORDER — SENNA 8.6 MG PO TABS: 2.0000 | ORAL_TABLET | Freq: Every day | ORAL | 0 refills | Status: AC

## 2016-06-07 MED ORDER — DOCUSATE SODIUM 100 MG PO CAPS: 100.0000 mg | ORAL_CAPSULE | Freq: Two times a day (BID) | ORAL | 1 refills | Status: AC

## 2016-06-07 NOTE — Discharge Instructions (Signed)
°Dr. Brian Swinteck °Joint Replacement Specialist °[facility] °[address] °[place], [place] [address] °[phone] ° ° °TOTAL HIP REPLACEMENT POSTOPERATIVE DIRECTIONS ° ° ° °Hip Rehabilitation, Guidelines Following Surgery  ° °WEIGHT BEARING °Weight bearing as tolerated with assist device (walker, cane, etc) as directed, use it as long as suggested by your surgeon or therapist, typically at least 4-6 weeks. ° °The results of a hip operation are greatly improved after range of motion and muscle strengthening exercises. Follow all safety measures which are given to protect your hip. If any of these exercises cause increased pain or swelling in your joint, decrease the amount until you are comfortable again. Then slowly increase the exercises. Call your caregiver if you have problems or questions.  ° °HOME CARE INSTRUCTIONS  °Most of the following instructions are designed to prevent the dislocation of your new hip.  °Remove items at home which could result in a fall. This includes throw rugs or furniture in walking pathways.  °Continue medications as instructed at time of discharge. °· You may have some home medications which will be placed on hold until you complete the course of blood thinner medication. °· You may start showering once you are discharged home. Do not remove your dressing. °Do not put on socks or shoes without following the instructions of your caregivers.   °Sit on chairs with arms. Use the chair arms to help push yourself up when arising.  °Arrange for the use of a toilet seat elevator so you are not sitting low.  °· Walk with walker as instructed.  °You may resume a sexual relationship in one month or when given the OK by your caregiver.  °Use walker as long as suggested by your caregivers.  °You may put full weight on your legs and walk as much as is comfortable. °Avoid periods of inactivity such as sitting longer than an hour when not asleep. This helps prevent  blood clots.  °You may return to work once you are cleared by your surgeon.  °Do not drive a car for 6 weeks or until released by your surgeon.  °Do not drive while taking narcotics.  °Wear elastic stockings for two weeks following surgery during the day but you may remove then at night.  °Make sure you keep all of your appointments after your operation with all of your doctors and caregivers. You should call the office at the above phone number and make an appointment for approximately two weeks after the date of your surgery. °Please pick up a stool softener and laxative for home use as long as you are requiring pain medications. °· ICE to the affected hip every three hours for 30 minutes at a time and then as needed for pain and swelling. Continue to use ice on the hip for pain and swelling from surgery. You may notice swelling that will progress down to the foot and ankle.  This is normal after surgery.  Elevate the leg when you are not up walking on it.   °It is important for you to complete the blood thinner medication as prescribed by your doctor. °· Continue to use the breathing machine which will help keep your temperature down.  It is common for your temperature to cycle up and down following surgery, especially at night when you are not up moving around and exerting yourself.  The breathing machine keeps your lungs expanded and your temperature down. ° °RANGE OF MOTION AND STRENGTHENING EXERCISES  °These exercises are   designed to help you keep full movement of your hip joint. Follow your caregiver's or physical therapist's instructions. Perform all exercises about fifteen times, three times per day or as directed. Exercise both hips, even if you have had only one joint replacement. These exercises can be done on a training (exercise) mat, on the floor, on a table or on a bed. Use whatever works the best and is most comfortable for you. Use music or television while you are exercising so that the exercises  are a pleasant break in your day. This will make your life better with the exercises acting as a break in routine you can look forward to.  °Lying on your back, slowly slide your foot toward your buttocks, raising your knee up off the floor. Then slowly slide your foot back down until your leg is straight again.  °Lying on your back spread your legs as far apart as you can without causing discomfort.  °Lying on your side, raise your upper leg and foot straight up from the floor as far as is comfortable. Slowly lower the leg and repeat.  °Lying on your back, tighten up the muscle in the front of your thigh (quadriceps muscles). You can do this by keeping your leg straight and trying to raise your heel off the floor. This helps strengthen the largest muscle supporting your knee.  °Lying on your back, tighten up the muscles of your buttocks both with the legs straight and with the knee bent at a comfortable angle while keeping your heel on the floor.  ° °SKILLED REHAB INSTRUCTIONS: °If the patient is transferred to a skilled rehab facility following release from the hospital, a list of the current medications will be sent to the facility for the patient to continue.  When discharged from the skilled rehab facility, please have the facility set up the patient's Home Health Physical Therapy prior to being released. Also, the skilled facility will be responsible for providing the patient with their medications at time of release from the facility to include their pain medication and their blood thinner medication. If the patient is still at the rehab facility at time of the two week follow up appointment, the skilled rehab facility will also need to assist the patient in arranging follow up appointment in our office and any transportation needs. ° °MAKE SURE YOU:  °Understand these instructions.  °Will watch your condition.  °Will get help right away if you are not doing well or get worse. ° °Pick up stool softner and  laxative for home use following surgery while on pain medications. °Do not remove your dressing. °The dressing is waterproof--it is OK to take showers. °Continue to use ice for pain and swelling after surgery. °Do not use any lotions or creams on the incision until instructed by your surgeon. °Total Hip Protocol. °POSTERIOR HIP PRECAUTIONS. ° ° ° ° °Information on my medicine - ELIQUIS® (apixaban) ° °This medication education was reviewed with me or my healthcare representative as part of my discharge preparation.  The pharmacist that spoke with me during my hospital stay was:  Alejandria Wessells Dien, RPH ° °Why was Eliquis® prescribed for you? °Eliquis® was prescribed for you to reduce the risk of blood clots forming after orthopedic surgery.   ° °What do You need to know about Eliquis®? °Take your Eliquis® TWICE DAILY - one tablet in the morning and one tablet in the evening with or without food.  It would be best to take the dose   about the same time each day. ° °If you have difficulty swallowing the tablet whole please discuss with your pharmacist how to take the medication safely. ° °Take Eliquis® exactly as prescribed by your doctor and DO NOT stop taking Eliquis® without talking to the doctor who prescribed the medication.  Stopping without other medication to take the place of Eliquis® may increase your risk of developing a clot. ° °After discharge, you should have regular check-up appointments with your healthcare provider that is prescribing your Eliquis®. ° °What do you do if you miss a dose? °If a dose of ELIQUIS® is not taken at the scheduled time, take it as soon as possible on the same day and twice-daily administration should be resumed.  The dose should not be doubled to make up for a missed dose.  Do not take more than one tablet of ELIQUIS at the same time. ° °Important Safety Information °A possible side effect of Eliquis® is bleeding. You should call your healthcare provider right away if you experience  any of the following: °? Bleeding from an injury or your nose that does not stop. °? Unusual colored urine (red or dark brown) or unusual colored stools (red or black). °? Unusual bruising for unknown reasons. °? A serious fall or if you hit your head (even if there is no bleeding). ° °Some medicines may interact with Eliquis® and might increase your risk of bleeding or clotting while on Eliquis®. To help avoid this, consult your healthcare provider or pharmacist prior to using any new prescription or non-prescription medications, including herbals, vitamins, non-steroidal anti-inflammatory drugs (NSAIDs) and supplements. ° °This website has more information on Eliquis® (apixaban): http://www.eliquis.com/eliquis/home °

## 2016-06-07 NOTE — Progress Notes (Signed)
   Subjective:  Patient reports pain as mild to moderate.  C/o nausea. Denies E/Cp/SOB.  Objective:   VITALS:   Vitals:   06/06/16 2355 06/07/16 0200 06/07/16 0300 06/07/16 0400  BP: (!) 132/49   (!) 130/49  Pulse: 94  85 70  Resp: 17   18  Temp: 99 F (37.2 C)   98.8 F (37.1 C)  TempSrc: Oral   Oral  SpO2: 100% 100% 100% 100%  Weight:      Height:        NAD ABD soft Sensation intact distally Intact pulses distally Dorsiflexion/Plantar flexion intact Incision: dressing C/D/I Compartment soft   Lab Results  Component Value Date   WBC 7.3 05/27/2016   HGB 11.8 (L) 05/27/2016   HCT 36.6 05/27/2016   MCV 94.3 05/27/2016   PLT 337 05/27/2016   BMET    Component Value Date/Time   NA 140 05/27/2016 0954   NA 142 03/09/2015   K 4.1 05/27/2016 0954   CL 112 (H) 05/27/2016 0954   CO2 17 (L) 05/27/2016 0954   GLUCOSE 108 (H) 05/27/2016 0954   BUN 19 05/27/2016 0954   BUN 8 03/09/2015   CREATININE 1.20 (H) 05/27/2016 0954   CALCIUM 9.3 05/27/2016 0954   GFRNONAA 44 (L) 05/27/2016 0954   GFRAA 51 (L) 05/27/2016 0954     Assessment/Plan: 1 Day Post-Op   Principal Problem:   Painful orthopaedic hardware (HCC) Active Problems:   Avascular necrosis of hip, left (HCC)   WBAT with walker Posterior hip precautions DVT ppx: apixaban, SCDs, TEDS PT/OT PO pain control AM labs pending Dispo: likely d/c home tomorrow with HHPT   Ahliya Glatt, Cloyde ReamsBrian James 06/07/2016, 7:50 AM   Samson FredericBrian Miamor Ayler, MD Cell [phone]

## 2016-06-07 NOTE — Evaluation (Signed)
Physical Therapy Evaluation Patient Details Name: [name] MRN: [id number] DOB: August 25, 1945 Today's Date: 06/07/2016   History of Present Illness  Pt is a 71 yo female with history of L hip hemiarthroplasty with c/o pain, dx with AVN , s/p removal of deep L hip implant and conversion of previous surgery to L THA. PMH significant for dizziness, OA, and SoB.   Clinical Impression  Pt is s/p THA resulting in the deficits listed below (see PT Problem List). Pt is minA for bed mobility,  Pt will benefit from skilled PT to increase their independence and safety with mobility to allow discharge to the venue listed below.      Follow Up Recommendations Home health PT    Equipment Recommendations  3in1 (PT)    Recommendations for Other Services OT consult     Precautions / Restrictions Precautions Precautions: Posterior Hip Precaution Booklet Issued: Yes (comment) Precaution Comments: pt able to recall 2/3 precautions at end of session Restrictions Weight Bearing Restrictions: Yes LLE Weight Bearing: Weight bearing as tolerated      Mobility  Bed Mobility Overal bed mobility: Needs Assistance Bed Mobility: Supine to Sit     Supine to sit: Min assist     General bed mobility comments: pt required minA for pad scoot of hips to EoB and vc for hand placement to bring trunk to upright  Transfers Overall transfer level: Needs assistance Equipment used: Rolling walker (2 wheeled) Transfers: Sit to/from Stand Sit to Stand: Min assist         General transfer comment: pt required minA for powerup and steadying once in upright vc for anterior pelvic tilt  Ambulation/Gait Ambulation/Gait assistance: Min assist Ambulation Distance (Feet): 2 Feet Assistive device: Rolling walker (2 wheeled) Gait Pattern/deviations: Step-to pattern;Decreased step length - right;Decreased stance time - left;Decreased weight shift to left;Antalgic;Trunk flexed Gait velocity: slowed Gait velocity  interpretation: Below normal speed for age/gender General Gait Details: pt with buckling x 4 with weightbearing on L LE able to steady with min A from PT, vc for upright posture and increased weightshift to L LE to advance R LE         Balance Overall balance assessment: Needs assistance Sitting-balance support: Feet supported;Bilateral upper extremity supported Sitting balance-Leahy Scale: Fair Sitting balance - Comments:  (able to sit EoB )   Standing balance support: Bilateral upper extremity supported Standing balance-Leahy Scale: Poor Standing balance comment: pt requires RW to maintain balance                             Pertinent Vitals/Pain Pain Assessment: 0-10 Pain Score: 6  Pain Location: L hip Pain Descriptors / Indicators: Aching Pain Intervention(s): Premedicated before session;Monitored during session    Home Living Family/patient expects to be discharged to:: Private residence Living Arrangements: Children Available Help at Discharge: Family;Available 24 hours/day Type of Home: House Home Access: Stairs to enter Entrance Stairs-Rails: Left Entrance Stairs-Number of Steps: 3 Home Layout: Two level;Able to live on main level with bedroom/bathroom Home Equipment: Dan Humphreys - 2 wheels      Prior Function Level of Independence: Independent         Comments: community Personnel officer Dominance        Extremity/Trunk Assessment   Upper Extremity Assessment Upper Extremity Assessment: Defer to OT evaluation    Lower Extremity Assessment Lower Extremity Assessment: LLE deficits/detail LLE Deficits / Details: decreased L  hip and knee ROM and strength secondary to surgical pain LLE: Unable to fully assess due to pain    Cervical / Trunk Assessment Cervical / Trunk Assessment: Normal  Communication   Communication: No difficulties  Cognition Arousal/Alertness: Awake/alert Behavior During Therapy: WFL for tasks  assessed/performed Overall Cognitive Status: Within Functional Limits for tasks assessed                                        General Comments General comments (skin integrity, edema, etc.): Pt limited by nausea and dizziness, BP 130/45 and SaO2 of >95% O2 on RA, HR 88 bpm    Exercises Total Joint Exercises Ankle Circles/Pumps: AROM;Both;10 reps;Seated Long Arc Quad: AROM;Both;10 reps;Seated Marching in Standing: AROM;Left;10 reps;Seated   Assessment/Plan    PT Assessment Patient needs continued PT services  PT Problem List Decreased strength;Decreased range of motion;Decreased activity tolerance;Decreased balance;Decreased mobility;Decreased knowledge of use of DME;Decreased safety awareness;Decreased knowledge of precautions;Pain       PT Treatment Interventions DME instruction;Gait training;Stair training;Functional mobility training;Therapeutic activities;Therapeutic exercise;Balance training;Patient/family education    PT Goals (Current goals can be found in the Care Plan section)  Acute Rehab PT Goals Patient Stated Goal: go home PT Goal Formulation: With patient Time For Goal Achievement: 06/21/16 Potential to Achieve Goals: Fair    Frequency 7X/week    AM-PAC PT "6 Clicks" Daily Activity  Outcome Measure Difficulty turning over in bed (including adjusting bedclothes, sheets and blankets)?: Total Difficulty moving from lying on back to sitting on the side of the bed? : Total Difficulty sitting down on and standing up from a chair with arms (e.g., wheelchair, bedside commode, etc,.)?: Total Help needed moving to and from a bed to chair (including a wheelchair)?: A Lot Help needed walking in hospital room?: A Lot Help needed climbing 3-5 steps with a railing? : Total 6 Click Score: 8    End of Session Equipment Utilized During Treatment: Gait belt Activity Tolerance: Patient limited by pain;Other (comment) (limited by dizziness) Patient left: in  chair;with call bell/phone within reach;with family/visitor present Nurse Communication: Mobility status;Other (comment) (dizziness) PT Visit Diagnosis: Other abnormalities of gait and mobility (R26.89);Muscle weakness (generalized) (M62.81);Pain;Dizziness and giddiness (R42) Pain - Right/Left: Left Pain - part of body: Hip    Time: [id number] PT Time Calculation (min) (ACUTE ONLY): 39 min   Charges:   PT Evaluation $PT Eval Low Complexity: 1 Procedure PT Treatments $Gait Training: 8-22 mins $Therapeutic Activity: 8-22 mins   PT G Codes:        Marlise Fahr B. Beverely RisenVan Fleet PT, DPT Acute Rehabilitation  [phone] Pager [phone]    Elon Alaslizabeth B Van Fleet 06/07/2016, 10:46 AM

## 2016-06-07 NOTE — Care Management Note (Signed)
Case Management Note  Patient Details  Name: ANYSIA CHOI MRN: [id number] Date of Birth: 01/10/1946    Subjective/Objective:   71 yr old female s/p left total hip arthroplasty.  Action/Plan: Case manager spoke with patient and her daughter concerning Discharge plan and DME needs. Choice was offered for Home Health Agency, referral was called to Avie EchevariaKaren Nussbaum, [facility] Liaison. Patient has rolling walker, 3in1 will be delivered to her room. Patient will stay with her daughter until Sunday at: [address], [place], [place] , she will then return to her own home.   Expected Discharge Date:  06/08/16               Expected Discharge Plan:  Home w Home Health Services  In-House Referral:  NA  Discharge planning Services  CM Consult  Post Acute Care Choice:  Home Health, Durable Medical Equipment Choice offered to:  Patient, Adult Children  DME Arranged:  3-N-1 DME Agency:  [facility]  HH Arranged:  PT HH Agency:  [facility]  Status of Service:  Completed, signed off  If discussed at Long Length of Stay Meetings, dates discussed:    Additional Comments:  Durenda GuthrieBrady, Crystale Giannattasio Naomi, RN 06/07/2016, 12:46 PM

## 2016-06-07 NOTE — Discharge Summary (Signed)
Physician Discharge Summary  Patient ID: [name] MRN: [id number] DOB/AGE: Jun 26, 1945 71 y.o.  Admit date: 06/06/2016 Discharge date: 06/09/2016  Admission Diagnoses:  Painful orthopaedic hardware [facility])  Discharge Diagnoses:  Principal Problem:   Painful orthopaedic hardware [facility]) Active Problems:   Avascular necrosis of hip, left [facility])   Past Medical History:  Diagnosis Date  . Anemia   . Anxiety   . Arthritis   . Azotemia   . Chest pain   . Chicken pox   . Depressive disorder   . Dizziness   . Fall 06/15/2011   Caused discomfort in lower left rib and left lateral hip area  . GERD (gastroesophageal reflux disease)   . H/O: hysterectomy 1985  . History of MRSA infection   . Hypertension   . Hypokalemia   . Left trimalleolar fracture   . Leukocytosis   . OCD (obsessive compulsive disorder)   . Opioid abuse   . Pre-syncope   . Respiratory failure (HCC)   . Severe sepsis (HCC)   . SOB (shortness of breath) on exertion     Surgeries: Procedure(s): LEFT TOTAL HIP ARTHROPLASTY WITH HARDWARE REMOVAL on 06/06/2016   Consultants (if any):   Discharged Condition: Improved  Hospital Course: [name] is an 72 y.o. female who was admitted 06/06/2016 with a diagnosis of Painful orthopaedic hardware [facility]) and went to the operating room on 06/06/2016 and underwent the above named procedures.    She was given perioperative antibiotics:  Anti-infectives    Start     Dose/Rate Route Frequency Ordered Stop   06/06/16 2300  ceFAZolin (ANCEF) IVPB 1 g/50 mL premix     1 g 100 mL/hr over 30 Minutes Intravenous Every 8 hours 06/06/16 2120 06/07/16 0701   06/06/16 1430  ceFAZolin (ANCEF) IVPB 2g/100 mL premix     2 g 200 mL/hr over 30 Minutes Intravenous To [facility]Stay [facility] 06/03/16 0927 06/06/16 1610    .  She was given sequential compression devices, early ambulation, and apixaban for DVT prophylaxis.  She received 2 units PRBCs for ABLA (hgb 6.4) with excellent  response.  She benefited maximally from the hospital stay and there were no complications.    Recent vital signs:  Vitals:   06/08/16 2300 06/09/16 0546  BP:  (!) 183/69  Pulse:  (!) 105  Resp:  18  Temp: 99.5 F (37.5 C) 99.3 F (37.4 C)    Recent laboratory studies:  Lab Results  Component Value Date   HGB 10.2 (L) 06/09/2016   HGB 9.8 (L) 06/08/2016   HGB 6.4 (LL) 06/08/2016   Lab Results  Component Value Date   WBC 11.0 (H) 06/09/2016   PLT 205 06/09/2016   Lab Results  Component Value Date   INR 1.10 12/25/2014   Lab Results  Component Value Date   NA 136 06/07/2016   K 3.8 06/07/2016   CL 103 06/07/2016   CO2 24 06/07/2016   BUN 15 06/07/2016   CREATININE 0.86 06/07/2016   GLUCOSE 126 (H) 06/07/2016    Discharge Medications:   Allergies as of 06/09/2016      Reactions   Sulfa Antibiotics Hives      Medication List    STOP taking these medications   aspirin 325 MG tablet   capsaicin 0.025 % cream Commonly known as:  ZOSTRIX     TAKE these medications   amLODipine 5 MG tablet Commonly known as:  NORVASC Take 1 tablet (5 mg total) by mouth  daily.   apixaban 2.5 MG Tabs tablet Commonly known as:  ELIQUIS Take 1 tablet (2.5 mg total) by mouth every 12 (twelve) hours.   CALCIUM 600 + D PO Take 1 tablet by mouth daily.   docusate sodium 100 MG capsule Commonly known as:  COLACE Take 1 capsule (100 mg total) by mouth 2 (two) times daily.   HYDROcodone-acetaminophen 5-325 MG tablet Commonly known as:  NORCO/VICODIN Take 1-2 tablets by mouth every 4 (four) hours as needed (breakthrough pain).   ibuprofen 200 MG tablet Commonly known as:  ADVIL,MOTRIN Take 400 mg by mouth every 6 (six) hours as needed for headache or moderate pain.   metoprolol tartrate 25 MG tablet Commonly known as:  LOPRESSOR Take 1 tablet (25 mg total) by mouth 2 (two) times daily.   multivitamin with minerals Tabs tablet Take 1 tablet by mouth daily.   ondansetron  4 MG tablet Commonly known as:  ZOFRAN Take 1 tablet (4 mg total) by mouth every 6 (six) hours as needed for nausea.   pantoprazole 40 MG tablet Commonly known as:  PROTONIX Take 40 mg by mouth daily. Before breakfast   senna 8.6 MG Tabs tablet Commonly known as:  SENOKOT Take 2 tablets (17.2 mg total) by mouth at bedtime.   sertraline 100 MG tablet Commonly known as:  ZOLOFT Take 100 mg by mouth daily.            Durable Medical Equipment        Start     Ordered   06/06/16 2121  DME 3 n 1  Once     06/06/16 2120   06/06/16 2121  DME Walker rolling  Once    Question:  Patient needs a walker to treat with the following condition  Answer:  Status post total hip replacement, left   06/06/16 2120      Diagnostic Studies: Dg Pelvis Portable  Result Date: 06/06/2016 CLINICAL DATA:  Post left hip arthroplasty. EXAM: PORTABLE PELVIS 1-2 VIEWS COMPARISON:  Radiographs 02/27/2015 FINDINGS: Left hip arthroplasty in expected alignment. Remote intertrochanteric fracture. Expected postsurgical change includes subcutaneous air and soft tissue edema. Pubic rami and visualized bony pelvis are intact. IMPRESSION: Left hip arthroplasty without immediate postoperative complication. Electronically Signed   By: Rubye OaksMelanie  Ehinger M.D.   On: 06/06/2016 20:01   Dg C-arm 61-120 Min  Result Date: 06/06/2016 CLINICAL DATA:  Removal of existing left hip hardware follow-up by left THR EXAM: OPERATIVE left HIP (WITH PELVIS IF PERFORMED) 3 VIEWS fluoroscopic time is 58 seconds TECHNIQUE: Fluoroscopic spot image(s) were submitted for interpretation post-operatively. COMPARISON:  None. FINDINGS: Left hip replacement is identified without malalignment. IMPRESSION: Left hip replacement is identified without malalignment. Electronically Signed   By: Sherian ReinWei-Chen  Lin M.D.   On: 06/06/2016 20:37   Dg Hip Operative Unilat W Or W/o Pelvis Left  Result Date: 06/06/2016 CLINICAL DATA:  Removal of existing left hip  hardware follow-up by left THR EXAM: OPERATIVE left HIP (WITH PELVIS IF PERFORMED) 3 VIEWS fluoroscopic time is 58 seconds TECHNIQUE: Fluoroscopic spot image(s) were submitted for interpretation post-operatively. COMPARISON:  None. FINDINGS: Left hip replacement is identified without malalignment. IMPRESSION: Left hip replacement is identified without malalignment. Electronically Signed   By: Sherian ReinWei-Chen  Lin M.D.   On: 06/06/2016 20:37    Disposition: 01-Home or Self Care  Discharge Instructions    Call MD / Call 911    Complete by:  As directed    If you experience chest pain or  shortness of breath, CALL 911 and be transported to the hospital emergency room.  If you develope a fever above 101 F, pus (white drainage) or increased drainage or redness at the wound, or calf pain, call your surgeon's office.   Constipation Prevention    Complete by:  As directed    Drink plenty of fluids.  Prune juice may be helpful.  You may use a stool softener, such as Colace (over the counter) 100 mg twice a day.  Use MiraLax (over the counter) for constipation as needed.   Diet - low sodium heart healthy    Complete by:  As directed    Driving restrictions    Complete by:  As directed    No driving for 6 weeks   Follow the hip precautions as taught in Physical Therapy    Complete by:  As directed    Increase activity slowly as tolerated    Complete by:  As directed    Lifting restrictions    Complete by:  As directed    No lifting for 6 weeks   TED hose    Complete by:  As directed    Use stockings (TED hose) for 2 weeks on both leg(s).  You may remove them at night for sleeping.      Follow-up Information    Janaa Acero, Arlys John, MD. Schedule an appointment as soon as possible for a visit in 2 week(s).   Specialty:  Orthopedic Surgery Why:  For wound re-check Contact information: [address] [place] [address] [phone]        [facility]-Home Follow up.    Why:  A representative from [facility] will contact you to arrange start date and time for your therapy. Contact information: [address] [place] [address] [phone]            Signed: Garnet Koyanagi 06/09/2016, 7:54 AM

## 2016-06-07 NOTE — Progress Notes (Signed)
Stitches removed from right forearm per Dr. Kathline MagicSwinteck's request. Site remains closed and healing, unremarkable.

## 2016-06-07 NOTE — Progress Notes (Addendum)
Physical Therapy Treatment Patient Details Name: [name] MRN: [id number] DOB: 07/07/1945 Today's Date: 06/07/2016    History of Present Illness Pt is a 71 yo female with history of L hip hemiarthroplasty with c/o pain, dx with AVN , s/p removal of deep L hip implant and conversion of previous surgery to L THA. PMH significant for dizziness, OA, and SoB.     PT Comments    Pt is slowly progressing towards her goals. Pt currently min guard for bed mobility, minA for transfers and minA for ambulation of 12 feet with RW. Pt continues to complain of dizziness with standing and ambulation. Pt would benefit from 1-2 more days of acute skilled PT to progress ambulation and begin stair training and to improve LE strength and endurance to be able to safely navigate in her home environment.    Follow Up Recommendations  Home health PT     Equipment Recommendations  3in1 (PT)    Recommendations for Other Services OT consult     Precautions / Restrictions Precautions Precautions: Posterior Hip Precaution Booklet Issued: Yes (comment) Precaution Comments: pt able to recall 2/3 precautions at end of session Restrictions Weight Bearing Restrictions: Yes LLE Weight Bearing: Weight bearing as tolerated    Mobility  Bed Mobility Overal bed mobility: Needs Assistance Bed Mobility: Supine to Sit     Supine to sit: Min guard     General bed mobility comments: pt required verbal cuing to slow down and maintain hip precautions even though she needed to use the toilet  Transfers Overall transfer level: Needs assistance Equipment used: Rolling walker (2 wheeled) Transfers: Sit to/from [employer] Sit to Stand: Min assist Stand pivot transfers: Min assist       General transfer comment: pt required minA for powerup and steadying once in upright vc for hand placement and controlled lowering to BSC in stand pivot transfer and controlled lowering utilizing recliner armrests    Ambulation/Gait Ambulation/Gait assistance: Min assist Ambulation Distance (Feet): 12 Feet Assistive device: Rolling walker (2 wheeled) Gait Pattern/deviations: Step-to pattern;Decreased step length - right;Decreased stance time - left;Decreased weight shift to left;Antalgic;Trunk flexed Gait velocity: slowed Gait velocity interpretation: Below normal speed for age/gender General Gait Details: pt did not have buckling, vc for sequencing and use of UE to advance R LE, pt fatigued easily with c/o of dizziness       Balance Overall balance assessment: Needs assistance Sitting-balance support: Feet supported;Bilateral upper extremity supported Sitting balance-Leahy Scale: Fair Sitting balance - Comments:  (able to sit EoB )   Standing balance support: Bilateral upper extremity supported Standing balance-Leahy Scale: Poor Standing balance comment: pt requires RW to maintain balance                            Cognition Arousal/Alertness: Awake/alert Behavior During Therapy: WFL for tasks assessed/performed Overall Cognitive Status: Within Functional Limits for tasks assessed                                        Exercises Total Joint Exercises Ankle Circles/Pumps: AROM;Both;10 reps;Seated Quad Sets: AROM;Both;10 reps Heel Slides: AROM;Right;10 reps;Seated Long Arc Quad: AROM;Both;10 reps;Seated    General Comments General comments (skin integrity, edema, etc.): Pt continues to c/o dizziness with standing and walking although says it is better than this morning. Pt daughter and grandaughter in room  during session.       Pertinent Vitals/Pain Pain Assessment: 0-10 Pain Score: 7  Pain Location: L hip Pain Descriptors / Indicators: Aching Pain Intervention(s): Limited activity within patient's tolerance;Monitored during session;Ice applied  SaO2 on RA >95% O2 throughout session            PT Goals (current goals can now be found in the care  plan section) Acute Rehab PT Goals Patient Stated Goal: go home PT Goal Formulation: With patient Time For Goal Achievement: 06/21/16 Potential to Achieve Goals: Fair    Frequency    7X/week      PT Plan Current plan remains appropriate       AM-PAC PT "6 Clicks" Daily Activity  Outcome Measure  Difficulty turning over in bed (including adjusting bedclothes, sheets and blankets)?: Total Difficulty moving from lying on back to sitting on the side of the bed? : Total Difficulty sitting down on and standing up from a chair with arms (e.g., wheelchair, bedside commode, etc,.)?: Total Help needed moving to and from a bed to chair (including a wheelchair)?: A Lot Help needed walking in hospital room?: A Lot Help needed climbing 3-5 steps with a railing? : Total 6 Click Score: 8    End of Session Equipment Utilized During Treatment: Gait belt Activity Tolerance: Patient limited by pain;Other (comment) (limited by dizziness) Patient left: in chair;with call bell/phone within reach;with family/visitor present Nurse Communication: Mobility status;Other (comment) (dizziness) PT Visit Diagnosis: Other abnormalities of gait and mobility (R26.89);Muscle weakness (generalized) (M62.81);Pain;Dizziness and giddiness (R42) Pain - Right/Left: Left Pain - part of body: Hip     Time: 1435-1457 PT Time Calculation (min) (ACUTE ONLY): 22 min  Charges:  $Therapeutic Exercise: 8-22 mins                    G Codes:       Efraim Vanallen B. Beverely RisenVan [name] PT, DPT Acute Rehabilitation  [phone] Pager [phone]     [name] 06/07/2016, 4:12 PM

## 2016-06-08 ENCOUNTER — Encounter (HOSPITAL_COMMUNITY): Payer: Self-pay | Admitting: *Deleted

## 2016-06-08 LAB — CBC
HEMATOCRIT: 19.7 % — AB (ref 36.0–46.0)
HEMOGLOBIN: 6.4 g/dL — AB (ref 12.0–15.0)
MCH: 30.6 pg (ref 26.0–34.0)
MCHC: 32.5 g/dL (ref 30.0–36.0)
MCV: 94.3 fL (ref 78.0–100.0)
Platelets: 202 10*3/uL (ref 150–400)
RBC: 2.09 MIL/uL — AB (ref 3.87–5.11)
RDW: 17 % — AB (ref 11.5–15.5)
WBC: 7.3 10*3/uL (ref 4.0–10.5)

## 2016-06-08 LAB — PREPARE RBC (CROSSMATCH)

## 2016-06-08 LAB — HEMOGLOBIN AND HEMATOCRIT, BLOOD
HEMATOCRIT: 30.1 % — AB (ref 36.0–46.0)
HEMOGLOBIN: 9.8 g/dL — AB (ref 12.0–15.0)

## 2016-06-08 MED ORDER — SODIUM CHLORIDE 0.9 % IV SOLN: Freq: Once | INTRAVENOUS | Status: DC

## 2016-06-08 NOTE — Evaluation (Signed)
Occupational Therapy Evaluation Patient Details Name: [name] MRN: [id number] DOB: 12/01/1945 Today's Date: 06/08/2016    History of Present Illness Pt is a 71 yo female with history of L hip hemiarthroplasty with c/o pain, dx with AVN , s/p removal of deep L hip implant and conversion of previous surgery to L THA. PMH significant for dizziness, OA, and SoB.    Clinical Impression   PTA Pt independent in ADL and mobility (community driver and mobilization). Pt currently mod assist for ADL and min A for mobility with RW. Pt able to recall 3/3 hip precautions. Pt and daughter received all education for AE hip kit. Pt getting one unit of blood during session, and was appropriately fatigued/lethargic. OT will continue to follow in acute setting to maximize safety and independence in ADL and continue edcuation with posterior hip precautions and application during ADL. Pt will require HHOT to continue education and application in home environment. Next session to focus on tub transfer and education of 3 in 1 as shower seat.     Follow Up Recommendations  Home health OT    Equipment Recommendations  3 in 1 bedside commode    Recommendations for Other Services       Precautions / Restrictions Precautions Precautions: Posterior Hip Precaution Booklet Issued: Yes (comment) Precaution Comments: pt able to recall 3/3 precautions at beginning and end of session Restrictions Weight Bearing Restrictions: Yes LLE Weight Bearing: Weight bearing as tolerated      Mobility Bed Mobility Overal bed mobility: Needs Assistance Bed Mobility: Supine to Sit     Supine to sit: Min assist     General bed mobility comments: pt required minA (with bed pad) to bring hips to EOB, Pt used rails to assist  Transfers Overall transfer level: Needs assistance Equipment used: Rolling walker (2 wheeled) Transfers: Stand Pivot Transfers   Stand pivot transfers: Min assist       General transfer  comment: pt required minA for powerup and steadying once in upright vc for safe hand placement and maintaining precautions initially    Balance Overall balance assessment: Needs assistance Sitting-balance support: Feet supported;Bilateral upper extremity supported Sitting balance-Leahy Scale: Fair Sitting balance - Comments: sitting EOB with no back support   Standing balance support: Bilateral upper extremity supported Standing balance-Leahy Scale: Poor Standing balance comment: pt requires RW to maintain balance                           ADL either performed or assessed with clinical judgement   ADL Overall ADL's : Needs assistance/impaired Eating/Feeding: Modified independent;Sitting   Grooming: Wash/dry face;Set up;Sitting Grooming Details (indicate cue type and reason): in recliner Upper Body Bathing: Set up;Sitting   Lower Body Bathing: Moderate assistance;With caregiver independent assisting;With adaptive equipment;Sitting/lateral leans   Upper Body Dressing : Set up;Sitting   Lower Body Dressing: Moderate assistance;With caregiver independent assisting;With adaptive equipment;Sit to/from stand   Toilet Transfer: Moderate assistance;Stand-pivot;Cueing for sequencing;Cueing for safety;Adhering to hip precautions;BSC;RW Toilet Transfer Details (indicate cue type and reason): Pt attempting to sit early, needed verbal cues for sequencing and safety - hand placement; simulated through recliner     Tub/ Shower Transfer: Moderate assistance;With caregiver independent assisting;Adhering to hip precautions;3 in 1;Rolling walker   Functional mobility during ADLs: Minimal assistance;Rolling walker;Cueing for sequencing;Cueing for safety General ADL Comments: Pt educated in AE kit (grabber/reacher, longt handle shoe horn, sock aide, and long handle sponge) to assist with LB  bathing/dressing. Pt and daughter verbally acknowledged understanding     Vision Patient Visual  Report: No change from baseline Vision Assessment?: No apparent visual deficits     Perception     Praxis      Pertinent Vitals/Pain Pain Assessment: 0-10 Pain Score: 8  Pain Location: L hip Pain Descriptors / Indicators: Aching Pain Intervention(s): Limited activity within patient's tolerance;Premedicated before session;Monitored during session;Repositioned;Ice applied     Hand Dominance     Extremity/Trunk Assessment Upper Extremity Assessment Upper Extremity Assessment: Overall WFL for tasks assessed   Lower Extremity Assessment Lower Extremity Assessment: LLE deficits/detail LLE Deficits / Details: decreased L hip and knee ROM and strength secondary to surgical pain LLE: Unable to fully assess due to pain   Cervical / Trunk Assessment Cervical / Trunk Assessment: Normal   Communication Communication Communication: No difficulties   Cognition Arousal/Alertness: Awake/alert Behavior During Therapy: WFL for tasks assessed/performed Overall Cognitive Status: Within Functional Limits for tasks assessed                                 General Comments: Slightly drowsy due to medication - but able to participate in therapy   General Comments  HR elevated to 130 during transfer to recliner, and then went back down to 90's soon after sitting; Pt is receiving 2 units of blood (1 currently) to address low hemoglobin (6.4)    Exercises     Shoulder Instructions      Home Living Family/patient expects to be discharged to:: Private residence Living Arrangements: Children Available Help at Discharge: Family;Available 24 hours/day Type of Home: House Home Access: Stairs to enter [employer] of Steps: 3 Entrance Stairs-Rails: Left Home Layout: Two level;Able to live on main level with bedroom/bathroom Alternate Level Stairs-Number of Steps: flight   Bathroom Shower/Tub: Teacher, early years/pre: Standard Bathroom Accessibility: Yes How  Accessible: Accessible via walker Home Equipment: Walker - 2 wheels   Additional Comments: Pt will have daughter or son with her      Prior Functioning/Environment Level of Independence: Independent        Comments: community ambulator and driver        OT Problem List: Decreased strength;Decreased range of motion;Decreased activity tolerance;Impaired balance (sitting and/or standing);Decreased safety awareness;Decreased knowledge of use of DME or AE;Decreased knowledge of precautions;Pain      OT Treatment/Interventions: Self-care/ADL training;Energy conservation;DME and/or AE instruction;Therapeutic activities;Patient/family education;Balance training    OT Goals(Current goals can be found in the care plan section) Acute Rehab OT Goals Patient Stated Goal: go home OT Goal Formulation: With patient Time For Goal Achievement: 06/22/16 Potential to Achieve Goals: Good ADL Goals Pt Will Perform Upper Body Bathing: with set-up;sitting Pt Will Perform Lower Body Bathing: with min guard assist;with adaptive equipment;with caregiver independent in assisting;sitting/lateral leans Pt Will Transfer to Toilet: with supervision;bedside commode;stand pivot transfer Pt Will Perform Toileting - Clothing Manipulation and hygiene: with modified independence;sit to/from stand Pt Will Perform Tub/Shower Transfer: Tub transfer;with min guard assist;Stand pivot transfer;3 in 1;rolling walker Additional ADL Goal #1: Pt will recall 3/3 posterior hip precautions and maintain during ADL at supervision level  OT Frequency: Min 2X/week   Barriers to D/C:            Co-evaluation              AM-PAC PT "6 Clicks" Daily Activity     Outcome Measure Help from  another person eating meals?: None Help from another person taking care of personal grooming?: A Little Help from another person toileting, which includes using toliet, bedpan, or urinal?: A Lot Help from another person bathing (including  washing, rinsing, drying)?: A Lot Help from another person to put on and taking off regular upper body clothing?: None Help from another person to put on and taking off regular lower body clothing?: A Lot 6 Click Score: 17   End of Session Equipment Utilized During Treatment: Rolling walker;Gait belt Nurse Communication: Mobility status;Weight bearing status;Precautions  Activity Tolerance: Patient tolerated treatment well;Patient limited by fatigue ((Pt getting blood)) Patient left: in chair;with call bell/phone within reach;with family/visitor present  OT Visit Diagnosis: Unsteadiness on feet (R26.81);Other abnormalities of gait and mobility (R26.89);Pain Pain - Right/Left: Left Pain - part of body: Hip                Time: 3428-7681 OT Time Calculation (min): 25 min Charges:  OT General Charges $OT Visit: 1 Procedure OT Evaluation $OT Eval Moderate Complexity: 1 Procedure OT Treatments $Self Care/Home Management : 8-22 mins G-Codes:     Hulda Humphrey OTR/L [place] 06/08/2016, 10:01 AM

## 2016-06-08 NOTE — Progress Notes (Signed)
Physical Therapy Treatment Patient Details Name: Madison AbtsJudy L [name] MRN: [id number] DOB: 06/06/1945 Today's Date: 06/08/2016    History of Present Illness Pt is a 71 yo female with history of L hip hemiarthroplasty with c/o pain, dx with AVN , s/p removal of deep L hip implant and conversion of previous surgery to L THA. PMH significant for dizziness, OA, and SoB.     PT Comments    Pt with improved ambulation tolerance and gait pattern this date. Will complete stair negotiation again in the morning.   Follow Up Recommendations  Home health PT;Supervision/Assistance - 24 hour     Equipment Recommendations  3in1 (PT)    Recommendations for Other Services OT consult     Precautions / Restrictions Precautions Precautions: Posterior Hip Precaution Booklet Issued: Yes (comment) Precaution Comments: pt able to recall 3/3 precautions at beginning and end of session Restrictions Weight Bearing Restrictions: Yes LLE Weight Bearing: Weight bearing as tolerated    Mobility  Bed Mobility Overal bed mobility: Needs Assistance Bed Mobility: Supine to Sit     Supine to sit: Min assist     General bed mobility comments: v/c's for long sit technique and to minimize internal rotation of L LE. minA for L LE mangement  Transfers Overall transfer level: Needs assistance Equipment used: Rolling walker (2 wheeled) Transfers: Sit to/from Stand Sit to Stand: Min assist         General transfer comment: v/c's to push up from bed  Ambulation/Gait Ambulation/Gait assistance: Min guard Ambulation Distance (Feet): 100 Feet Assistive device: Rolling walker (2 wheeled) Gait Pattern/deviations: Step-to pattern;Decreased stride length;Decreased stance time - left;Decreased step length - right;Trunk flexed;Antalgic Gait velocity: slo Gait velocity interpretation: Below normal speed for age/gender General Gait Details: pt with improved foot clearance compared to this morning. encouraged reciprocal  gait pattern however pt unable and con't to do step to gait pattern   Stairs Stairs: NO   Stair Management:  Number of Stairs:  General stair comments:   Wheelchair Mobility    Modified Rankin (Stroke Patients Only)       Balance Overall balance assessment: Needs assistance Sitting-balance support: Feet supported Sitting balance-Leahy Scale: Fair     Standing balance support: Bilateral upper extremity supported Standing balance-Leahy Scale: Poor Standing balance comment: pt requires RW to maintain balance                            Cognition Arousal/Alertness: Awake/alert Behavior During Therapy: Impulsive Overall Cognitive Status: Within Functional Limits for tasks assessed                                 General Comments: v/c's to slow down      Exercises Total Joint Exercises Ankle Circles/Pumps: AROM;Both;10 reps;Seated Quad Sets: AROM;Both;20 reps;Supine Gluteal Sets: AROM;Both;10 reps Heel Slides: AROM;Left;20 reps;Supine    General Comments        Pertinent Vitals/Pain Pain Assessment: 0-10 Pain Score: 5  Pain Location: L hip Pain Descriptors / Indicators: Aching Pain Intervention(s): Monitored during session    Home Living                      Prior Function            PT Goals (current goals can now be found in the care plan section) Acute Rehab PT Goals Patient Stated Goal: go home  Progress towards PT goals: Progressing toward goals    Frequency    7X/week      PT Plan Current plan remains appropriate    Co-evaluation              AM-PAC PT "6 Clicks" Daily Activity  Outcome Measure  Difficulty turning over in bed (including adjusting bedclothes, sheets and blankets)?: A Little Difficulty moving from lying on back to sitting on the side of the bed? : A Little Difficulty sitting down on and standing up from a chair with arms (e.g., wheelchair, bedside commode, etc,.)?: A Little Help  needed moving to and from a bed to chair (including a wheelchair)?: A Little Help needed walking in hospital room?: A Little Help needed climbing 3-5 steps with a railing? : Total 6 Click Score: 16    End of Session Equipment Utilized During Treatment: Gait belt Activity Tolerance: Patient tolerated treatment well Patient left: in chair;with call bell/phone within reach;with family/visitor present Nurse Communication: Mobility status PT Visit Diagnosis: Unsteadiness on feet (R26.81);Pain Pain - Right/Left: Left Pain - part of body: Hip     Time: 1610-9604 PT Time Calculation (min) (ACUTE ONLY): 23 min  Charges:  $Gait Training: 8-22 mins $Therapeutic Exercise: 8-22 mins                    G Codes:      Lewis Shock, PT, DPT Pager #: [phone] Office #: [phone]    Loc Feinstein M Emauri Krygier 06/08/2016, 3:42 PM

## 2016-06-08 NOTE — Progress Notes (Signed)
CRITICAL VALUE ALERT  Critical value received: hemoglobin 6.4  Date of notification:  06/08/16  Time of notification:  6:25  Critical value read back:Yes.    Nurse who received alert:  [place] Cresencio Reesor   MD notified (1st page): Manfred ShirtsBrian Swinteck's after hours service      Time of first page:  0631  MD notified (2nd page):   Time of second page:  Responding MD:  Alphonsa OverallBrad Dixon MD  Time MD responded:  6:38

## 2016-06-08 NOTE — Anesthesia Postprocedure Evaluation (Signed)
Anesthesia Post Note  Patient: Madison AbtsJudy L [name]  Procedure(s) Performed: Procedure(s) (LRB): LEFT TOTAL HIP ARTHROPLASTY WITH HARDWARE REMOVAL (Left)  Patient location during evaluation: PACU Anesthesia Type: General Level of consciousness: awake and alert Pain management: pain level controlled Vital Signs Assessment: post-procedure vital signs reviewed and stable Respiratory status: spontaneous breathing, nonlabored ventilation, respiratory function stable and patient connected to nasal cannula oxygen Cardiovascular status: blood pressure returned to baseline and stable Postop Assessment: no signs of nausea or vomiting Anesthetic complications: no       Last Vitals:  Vitals:   06/08/16 0805 06/08/16 1013  BP: (!) 159/52 (!) 144/44  Pulse: 89 76  Resp: 18 18  Temp: 37.3 C 37.4 C    Last Pain:  Vitals:   06/08/16 1013  TempSrc: Oral  PainSc:                  Semaje Kinker DAVID

## 2016-06-08 NOTE — Progress Notes (Signed)
   Subjective:  Patient reports pain as mild to moderate.  C/o nausea. Denies E/CP/SOB.  Objective:   VITALS:   Vitals:   06/07/16 2253 06/08/16 0600 06/08/16 0746 06/08/16 0805  BP:  (!) 159/46 (!) 153/52 (!) 159/52  Pulse:  93 86 89  Resp:  16 18 18   Temp: 98.5 F (36.9 C) 97.8 F (36.6 C) 99 F (37.2 C) 99.1 F (37.3 C)  TempSrc: Oral Oral Oral Oral  SpO2: 97% 98% 94% 94%  Weight:      Height:        NAD ABD soft Sensation intact distally Intact pulses distally Dorsiflexion/Plantar flexion intact Incision: dressing C/D/I Compartment soft   Lab Results  Component Value Date   WBC 7.3 06/08/2016   HGB 6.4 (LL) 06/08/2016   HCT 19.7 (L) 06/08/2016   MCV 94.3 06/08/2016   PLT 202 06/08/2016   BMET    Component Value Date/Time   NA 136 06/07/2016 0724   NA 142 03/09/2015   K 3.8 06/07/2016 0724   CL 103 06/07/2016 0724   CO2 24 06/07/2016 0724   GLUCOSE 126 (H) 06/07/2016 0724   BUN 15 06/07/2016 0724   BUN 8 03/09/2015   CREATININE 0.86 06/07/2016 0724   CALCIUM 7.9 (L) 06/07/2016 0724   GFRNONAA >60 06/07/2016 0724   GFRAA >60 06/07/2016 0724     Assessment/Plan: 2 Days Post-Op   Principal Problem:   Painful orthopaedic hardware (HCC) Active Problems:   Avascular necrosis of hip, left (HCC)   WBAT with walker Posterior hip precautions DVT ppx: apixaban, SCDs, TEDS PT/OT PO pain control ABLA: hgb 6.4, transfuse 2 units PRBCs, recheck hgb in am Dispo: likely d/c home tomorrow with HHPT   Hasel Janish, Cloyde ReamsBrian James 06/08/2016, 8:28 AM   Samson FredericBrian Nataleigh Griffin, MD Cell [phone]

## 2016-06-08 NOTE — Progress Notes (Signed)
Physical Therapy Treatment Patient Details Name: [name] MRN: [id number] DOB: [birth year] Today's Date: 06/08/2016    History of Present Illness Pt is a 71 yo female with history of L hip hemiarthroplasty with c/o pain, dx with AVN , s/p removal of deep L hip implant and conversion of previous surgery to L THA. PMH significant for dizziness, OA, and SoB.     PT Comments    Pt progressing towards goals however remains weak with decreased activity tolerance. Pt unable to navigate stairs this session. Pt must be able to ascend/descend 3 stairs for safe d/c home. Pt impulsive with decreased safety awareness as well due to eagerness to go home.    Follow Up Recommendations  Home health PT;Supervision/Assistance - 24 hour     Equipment Recommendations  3in1 (PT)    Recommendations for Other Services OT consult     Precautions / Restrictions Precautions Precautions: Posterior Hip Precaution Booklet Issued: Yes (comment) Precaution Comments: pt able to recall 3/3 precautions at beginning and end of session Restrictions Weight Bearing Restrictions: Yes LLE Weight Bearing: Weight bearing as tolerated    Mobility  Bed Mobility               General bed mobility comments: pt up in chair upon PT arrival  Transfers Overall transfer level: Needs assistance Equipment used: Rolling walker (2 wheeled) Transfers: Sit to/from Stand Sit to Stand: Min assist         General transfer comment: v/c's to not bend past 90 deg at hip, v/c's for safe hand placement  Ambulation/Gait Ambulation/Gait assistance: Min guard Ambulation Distance (Feet): 50 Feet Assistive device: Rolling walker (2 wheeled) Gait Pattern/deviations: Step-to pattern;Decreased step length - right;Decreased stance time - left;Decreased weight shift to left;Antalgic;Trunk flexed Gait velocity: slow Gait velocity interpretation: Below normal speed for age/gender General Gait Details: pt with minimal R foot  clearance despite max v/c's to increased bilat UE WBing when WBIng through L LE   Stairs Stairs: Yes   Stair Management: One rail Left;Sideways Number of Stairs: 1 General stair comments: pt unable to ascend stairs. Pt with L knee buckling when attempting to ascend with R LE  Wheelchair Mobility    Modified Rankin (Stroke Patients Only)       Balance Overall balance assessment: Needs assistance Sitting-balance support: Feet supported Sitting balance-Leahy Scale: Fair     Standing balance support: Bilateral upper extremity supported Standing balance-Leahy Scale: Poor Standing balance comment: pt requires RW to maintain balance                            Cognition Arousal/Alertness: Awake/alert Behavior During Therapy: Impulsive Overall Cognitive Status: Within Functional Limits for tasks assessed                                 General Comments: pt anxious about not being able to go home. pt requiring max v/c's for safety      Exercises Total Joint Exercises Ankle Circles/Pumps: AROM;Both;10 reps;Seated Quad Sets: AROM;Both;10 reps Gluteal Sets: AROM;Both;10 reps    General Comments        Pertinent Vitals/Pain Pain Assessment: 0-10 Pain Score: 6  Pain Location: L hipL Pain Descriptors / Indicators: Aching Pain Intervention(s): Monitored during session    Home Living  Prior Function            PT Goals (current goals can now be found in the care plan section) Acute Rehab PT Goals Patient Stated Goal: go home Progress towards PT goals: Progressing toward goals    Frequency    7X/week      PT Plan Current plan remains appropriate    Co-evaluation              AM-PAC PT "6 Clicks" Daily Activity  Outcome Measure  Difficulty turning over in bed (including adjusting bedclothes, sheets and blankets)?: A Little Difficulty moving from lying on back to sitting on the side of the bed? : A  Little Difficulty sitting down on and standing up from a chair with arms (e.g., wheelchair, bedside commode, etc,.)?: A Little Help needed moving to and from a bed to chair (including a wheelchair)?: A Little Help needed walking in hospital room?: A Little Help needed climbing 3-5 steps with a railing? : Total 6 Click Score: 16    End of Session Equipment Utilized During Treatment: Gait belt Activity Tolerance: Patient limited by pain;Other (comment) Patient left: in chair;with call bell/phone within reach;with family/visitor present Nurse Communication: Mobility status;Other (comment) PT Visit Diagnosis: Unsteadiness on feet (R26.81);Pain Pain - Right/Left: Left Pain - part of body: Hip     Time: [id number] PT Time Calculation (min) (ACUTE ONLY): 30 min  Charges:  $Gait Training: 8-22 mins $Therapeutic Exercise: 8-22 mins                    G Codes:      Lewis ShockAshly Zailyn Thoennes, PT, DPT Pager #: [phone] Office #: [phone]  Dalene Robards M Deano Tomaszewski 06/08/2016, 2:23 PM

## 2016-06-09 ENCOUNTER — Encounter (HOSPITAL_COMMUNITY): Payer: Self-pay | Admitting: Orthopedic Surgery

## 2016-06-09 LAB — TYPE AND SCREEN
ABO/RH(D): A POS
Antibody Screen: NEGATIVE
UNIT DIVISION: 0
Unit division: 0

## 2016-06-09 LAB — CBC
HCT: 31.3 % — ABNORMAL LOW (ref 36.0–46.0)
HEMOGLOBIN: 10.2 g/dL — AB (ref 12.0–15.0)
MCH: 29.1 pg (ref 26.0–34.0)
MCHC: 32.6 g/dL (ref 30.0–36.0)
MCV: 89.2 fL (ref 78.0–100.0)
Platelets: 205 10*3/uL (ref 150–400)
RBC: 3.51 MIL/uL — AB (ref 3.87–5.11)
RDW: 18.3 % — ABNORMAL HIGH (ref 11.5–15.5)
WBC: 11 10*3/uL — AB (ref 4.0–10.5)

## 2016-06-09 LAB — BPAM RBC
Blood Product Expiration Date: 201805152359
Blood Product Expiration Date: 201805162359
ISSUE DATE / TIME: 201805090732
ISSUE DATE / TIME: 201805091031
Unit Type and Rh: 600
Unit Type and Rh: 6200

## 2016-06-09 NOTE — Progress Notes (Signed)
   Subjective:  Patient reports pain as mild to moderate.  Denies N/V/CP/SOB.  Objective:   VITALS:   Vitals:   06/08/16 1442 06/08/16 2010 06/08/16 2300 06/09/16 0546  BP: (!) 155/62 (!) 178/68  (!) 183/69  Pulse: 91 91  (!) 105  Resp: 18 17  18   Temp: 98.6 F (37 C) 100.2 F (37.9 C) 99.5 F (37.5 C) 99.3 F (37.4 C)  TempSrc: Oral Oral Axillary Oral  SpO2: 95% 95%  96%  Weight:      Height:        NAD ABD soft Sensation intact distally Intact pulses distally Dorsiflexion/Plantar flexion intact Incision: dressing C/D/I Compartment soft   Lab Results  Component Value Date   WBC 11.0 (H) 06/09/2016   HGB 10.2 (L) 06/09/2016   HCT 31.3 (L) 06/09/2016   MCV 89.2 06/09/2016   PLT 205 06/09/2016   BMET    Component Value Date/Time   NA 136 06/07/2016 0724   NA 142 03/09/2015   K 3.8 06/07/2016 0724   CL 103 06/07/2016 0724   CO2 24 06/07/2016 0724   GLUCOSE 126 (H) 06/07/2016 0724   BUN 15 06/07/2016 0724   BUN 8 03/09/2015   CREATININE 0.86 06/07/2016 0724   CALCIUM 7.9 (L) 06/07/2016 0724   GFRNONAA >60 06/07/2016 0724   GFRAA >60 06/07/2016 0724     Assessment/Plan: 3 Days Post-Op   Principal Problem:   Painful orthopaedic hardware (HCC) Active Problems:   Avascular necrosis of hip, left (HCC)   WBAT with walker Posterior hip precautions DVT ppx: apixaban, SCDs, TEDS PT/OT PO pain control ABLA: received 2 units PRBCswith good response, monitor Dispo: d/c home with HHPT   Vanisha Whiten, Cloyde ReamsBrian James 06/09/2016, 7:53 AM   Samson FredericBrian Danisha Brassfield, MD Cell [phone]

## 2016-06-09 NOTE — Progress Notes (Signed)
Occupational Therapy Treatment Patient Details Name: [name] MRN: [id number] DOB: 11-30-1945 Today's Date: 06/09/2016    History of present illness Pt is a 71 yo female with history of L hip hemiarthroplasty with c/o pain, dx with AVN , s/p removal of deep L hip implant and conversion of previous surgery to L THA. PMH significant for dizziness, OA, and SoB.    OT comments  Pt making progress towards OT goals this session. Pt was able to demonstrate tub transfer using DME and maintaining precautions. Pt continues to require vc to slow down and cues for precautions (although the Pt can recall 3/3 precautions when asked). Pt and daughter provided with handout for tub transfer in addition to practice. Pt and daughter had no further questions or concerns for OT at the end of session. OT education is complete and Pt is at adequate level for discharge from OT perspective, but will require HHOT to maximize SAFETY (precautions) and independence in ADL and functional transfers.   Follow Up Recommendations  Home health OT    Equipment Recommendations  3 in 1 bedside commode    Recommendations for Other Services      Precautions / Restrictions Precautions Precautions: Posterior Hip Precaution Booklet Issued: Yes (comment) Precaution Comments: pt able to recall 3/3 precautions at beginning of session Restrictions Weight Bearing Restrictions: Yes LLE Weight Bearing: Weight bearing as tolerated       Mobility Bed Mobility               General bed mobility comments: Pt sitting OOB in recliner when OT entered room  Transfers Overall transfer level: Needs assistance Equipment used: Rolling walker (2 wheeled) Transfers: Sit to/from Stand Sit to Stand: Min guard         General transfer comment: v/c's to push up from surface Pt is sitting on (recliner, 3 in 1)    Balance Overall balance assessment: Needs assistance Sitting-balance support: Feet supported Sitting balance-Leahy  Scale: Fair     Standing balance support: Bilateral upper extremity supported Standing balance-Leahy Scale: Poor Standing balance comment: pt requires BUE support to maintain balance                           ADL either performed or assessed with clinical judgement   ADL Overall ADL's : Needs assistance/impaired                         Toilet Transfer: Min guard;Ambulation;BSC;RW;Cueing for safety Toilet Transfer Details (indicate cue type and reason): Pt continues to move very quickly - vc for precautions and safe hand placement Toileting- Clothing Manipulation and Hygiene: Min guard;Sit to/from stand   Tub/ Shower Transfer: Tub transfer;Min guard;Adhering to hip precautions;Anterior/posterior;3 in Scientist, water quality Details (indicate cue type and reason): OT demonstrated, Pt practiced in ortho gym, handout provided - Pt and daughter verbalized and demonstrated understanding Functional mobility during ADLs: Min guard;Rolling walker;Cueing for safety       Vision       Perception     Praxis      Cognition Arousal/Alertness: Awake/alert Behavior During Therapy: Impulsive Overall Cognitive Status: Within Functional Limits for tasks assessed                                 General Comments: v/c's to slow down, maintain precautions during transfer  Exercises     Shoulder Instructions       General Comments Pt's daughter present for entire session    Pertinent Vitals/ Pain       Pain Assessment: 0-10 Pain Score: 5  Pain Location: L hip Pain Descriptors / Indicators: Aching Pain Intervention(s): Monitored during session;Repositioned;Limited activity within patient's tolerance;Ice applied  Home Living                                          Prior Functioning/Environment              Frequency  Min 2X/week        Progress Toward Goals  OT Goals(current goals can now be found  in the care plan section)  Progress towards OT goals: Progressing toward goals  Acute Rehab OT Goals Patient Stated Goal: go home OT Goal Formulation: With patient/family Time For Goal Achievement: 06/22/16 Potential to Achieve Goals: Good  Plan Discharge plan remains appropriate;Frequency remains appropriate    Co-evaluation                 AM-PAC PT "6 Clicks" Daily Activity     Outcome Measure   Help from another person eating meals?: None Help from another person taking care of personal grooming?: A Little Help from another person toileting, which includes using toliet, bedpan, or urinal?: A Little Help from another person bathing (including washing, rinsing, drying)?: A Lot Help from another person to put on and taking off regular upper body clothing?: None Help from another person to put on and taking off regular lower body clothing?: A Little 6 Click Score: 19    End of Session Equipment Utilized During Treatment: Rolling walker;Gait belt  OT Visit Diagnosis: Unsteadiness on feet (R26.81);Other abnormalities of gait and mobility (R26.89);Pain Pain - Right/Left: Left Pain - part of body: Hip   Activity Tolerance Patient tolerated treatment well   Patient Left in chair;with call bell/phone within reach;with family/visitor present   Nurse Communication Mobility status;Other (comment) (Pt at appropriate level for dc from OT perspective)        Time: [id number] OT Time Calculation (min): 22 min  Charges: OT General Charges $OT Visit: 1 Procedure OT Treatments $Self Care/Home Management : 8-22 mins  Sherryl MangesLaura Verlyn Dannenberg OTR/L [phone]   Evern BioLaura J Jeriah Skufca 06/09/2016, 10:33 AM

## 2016-06-09 NOTE — Progress Notes (Signed)
Physical Therapy Treatment Patient Details Name: Madison AbtsJudy L [name] MRN: [id number] DOB: 01/21/1946 Today's Date: 06/09/2016    History of Present Illness Pt is a 71 yo female with history of L hip hemiarthroplasty with c/o pain, dx with AVN , s/p removal of deep L hip implant and conversion of previous surgery to L THA. PMH significant for dizziness, OA, and SoB.     PT Comments    Pt is making progress towards her goals but continues to be very impulsive with all mobility. Pt repeated numerous times that she wanted to go home. Pt educated that in order to go home she was going to have to be more careful and take her time moving around. Pt is min guard for transfers with RW, min guard for ambulation with RW that progressed to minA as she fatigued after stair training. Pt is mod A for ascent/descent of 2 steps sideways with L hand rail requiring maximal cuing for L foot placement so that R foot would have room on the step tread. PT to educate daughter of mothers needs for assist at discharge until L LE becomes stronger. Pt requires skilled PT to advance gait and stair training and to improve L LE strength and ROM to be able to safely navigate her discharge environment.     Follow Up Recommendations  Home health PT;Supervision/Assistance - 24 hour     Equipment Recommendations  3in1 (PT)    Recommendations for Other Services OT consult     Precautions / Restrictions Precautions Precautions: Posterior Hip Precaution Booklet Issued: Yes (comment) Precaution Comments: pt able to recall 3/3 precautions at beginning and end of session Restrictions Weight Bearing Restrictions: Yes LLE Weight Bearing: Weight bearing as tolerated    Mobility  Bed Mobility Overal bed mobility: Needs Assistance Bed Mobility: Supine to Sit     Supine to sit: Min assist     General bed mobility comments: Pt sitting in recliner on entry  Transfers Overall transfer level: Needs assistance Equipment used:  Rolling walker (2 wheeled) Transfers: Sit to/from Stand Sit to Stand: Min guard         General transfer comment: vc for making sure she is steady in transfer to RW before she begins walking  Ambulation/Gait Ambulation/Gait assistance: Min guard;Min assist Ambulation Distance (Feet): 150 Feet Assistive device: Rolling walker (2 wheeled) Gait Pattern/deviations: Step-to pattern;Decreased stride length;Decreased stance time - left;Decreased step length - right;Trunk flexed;Antalgic Gait velocity: slowed Gait velocity interpretation: Below normal speed for age/gender General Gait Details: min guard to minA as she fatigued with walk from the gym and step trainingvc for upright posture, and increased UE support as her L LE becomes tired   Stairs Stairs: Yes   Stair Management: One rail Left Number of Stairs: 4 (2x2) General stair comments: modA required for LoB with descending steps as L knee buckled and pt unable to steady herself with railing. vc for leaving enough space for R LE in descent and to utilize UE to hold herself up when advancing R LE down stairs      Balance Overall balance assessment: Needs assistance Sitting-balance support: Feet unsupported Sitting balance-Leahy Scale: Fair Sitting balance - Comments: able to sit on BSC with feet off ground and single UE support   Standing balance support: Bilateral upper extremity supported Standing balance-Leahy Scale: Poor Standing balance comment: pt requires RW to maintain balance  Cognition Arousal/Alertness: Awake/alert Behavior During Therapy: Impulsive Overall Cognitive Status: Within Functional Limits for tasks assessed                                 General Comments: max v/c's to slow down with all mobility      Exercises Total Joint Exercises Quad Sets: AROM;Supine;Seated;10 reps;Left Heel Slides: AROM;Left;10 reps;Seated Hip ABduction/ADduction:  AAROM;Left;10 reps;Seated Straight Leg Raises: AAROM;Left;10 reps;Seated    General Comments General comments (skin integrity, edema, etc.): Pt's daughter present for entire session      Pertinent Vitals/Pain Pain Assessment: 0-10 Pain Score: 3  Pain Location: L hip Pain Descriptors / Indicators: Aching Pain Intervention(s): Monitored during session;Ice applied  SaO2 >95% on RA pre and post activity, HR 77 bpm at rest and 117 bpm post activity            PT Goals (current goals can now be found in the care plan section) Acute Rehab PT Goals Patient Stated Goal: go home PT Goal Formulation: With patient Time For Goal Achievement: 06/21/16 Potential to Achieve Goals: Fair Progress towards PT goals: Progressing toward goals    Frequency    7X/week      PT Plan Current plan remains appropriate       AM-PAC PT "6 Clicks" Daily Activity  Outcome Measure  Difficulty turning over in bed (including adjusting bedclothes, sheets and blankets)?: A Little Difficulty moving from lying on back to sitting on the side of the bed? : A Little Difficulty sitting down on and standing up from a chair with arms (e.g., wheelchair, bedside commode, etc,.)?: A Little Help needed moving to and from a bed to chair (including a wheelchair)?: A Little Help needed walking in hospital room?: A Little Help needed climbing 3-5 steps with a railing? : A Lot 6 Click Score: 17    End of Session Equipment Utilized During Treatment: Gait belt Activity Tolerance: Patient tolerated treatment well Patient left: in chair;with call bell/phone within reach Nurse Communication: Mobility status PT Visit Diagnosis: Unsteadiness on feet (R26.81);Pain Pain - Right/Left: Left Pain - part of body: Hip     Time: 1030-1053 PT Time Calculation (min) (ACUTE ONLY): 23 min  Charges:  $Gait Training: 8-22 mins $Therapeutic Exercise: 8-22 mins                    G Codes:       Kaloni Bisaillon B. Beverely Risen PT,  DPT Acute Rehabilitation  [phone] Pager [phone]     Elon Alas Fleet 06/09/2016, 11:04 AM

## 2016-07-01 ENCOUNTER — Emergency Department (HOSPITAL_COMMUNITY)
Admission: EM | Admit: 2016-07-01 | Discharge: 2016-07-01 | Disposition: A | Discharge status: Home / Self Care | Payer: Medicare HMO | Attending: Emergency Medicine | Admitting: Emergency Medicine

## 2016-07-01 ENCOUNTER — Emergency Department (HOSPITAL_COMMUNITY): Payer: Medicare HMO

## 2016-07-01 ENCOUNTER — Encounter (HOSPITAL_COMMUNITY): Payer: Self-pay | Admitting: Emergency Medicine

## 2016-07-01 DIAGNOSIS — R0789 Other chest pain: Secondary | ICD-10-CM | POA: Insufficient documentation

## 2016-07-01 DIAGNOSIS — Z7901 Long term (current) use of anticoagulants: Secondary | ICD-10-CM | POA: Diagnosis not present

## 2016-07-01 DIAGNOSIS — R079 Chest pain, unspecified: Secondary | ICD-10-CM | POA: Diagnosis present

## 2016-07-01 DIAGNOSIS — R911 Solitary pulmonary nodule: Secondary | ICD-10-CM | POA: Insufficient documentation

## 2016-07-01 DIAGNOSIS — I10 Essential (primary) hypertension: Secondary | ICD-10-CM | POA: Insufficient documentation

## 2016-07-01 DIAGNOSIS — Z79899 Other long term (current) drug therapy: Secondary | ICD-10-CM | POA: Diagnosis not present

## 2016-07-01 DIAGNOSIS — Z96642 Presence of left artificial hip joint: Secondary | ICD-10-CM | POA: Insufficient documentation

## 2016-07-01 LAB — CBC
HEMATOCRIT: 31.2 % — AB (ref 36.0–46.0)
HEMOGLOBIN: 10.6 g/dL — AB (ref 12.0–15.0)
MCH: 30.9 pg (ref 26.0–34.0)
MCHC: 34 g/dL (ref 30.0–36.0)
MCV: 91 fL (ref 78.0–100.0)
Platelets: 326 10*3/uL (ref 150–400)
RBC: 3.43 MIL/uL — ABNORMAL LOW (ref 3.87–5.11)
RDW: 15.6 % — AB (ref 11.5–15.5)
WBC: 7.5 10*3/uL (ref 4.0–10.5)

## 2016-07-01 LAB — URINALYSIS, ROUTINE W REFLEX MICROSCOPIC
BACTERIA UA: NONE SEEN
BILIRUBIN URINE: NEGATIVE
Glucose, UA: NEGATIVE mg/dL
HGB URINE DIPSTICK: NEGATIVE
Ketones, ur: NEGATIVE mg/dL
LEUKOCYTES UA: NEGATIVE
NITRITE: NEGATIVE
PH: 6 (ref 5.0–8.0)
Protein, ur: 100 mg/dL — AB
SPECIFIC GRAVITY, URINE: 1.006 (ref 1.005–1.030)

## 2016-07-01 LAB — COMPREHENSIVE METABOLIC PANEL
ALK PHOS: 177 U/L — AB (ref 38–126)
ALT: 15 U/L (ref 14–54)
ANION GAP: 11 (ref 5–15)
AST: 21 U/L (ref 15–41)
Albumin: 3.7 g/dL (ref 3.5–5.0)
BILIRUBIN TOTAL: 0.3 mg/dL (ref 0.3–1.2)
BUN: 10 mg/dL (ref 6–20)
CALCIUM: 8.4 mg/dL — AB (ref 8.9–10.3)
CO2: 21 mmol/L — ABNORMAL LOW (ref 22–32)
CREATININE: 0.73 mg/dL (ref 0.44–1.00)
Chloride: 105 mmol/L (ref 101–111)
Glucose, Bld: 109 mg/dL — ABNORMAL HIGH (ref 65–99)
Potassium: 3 mmol/L — ABNORMAL LOW (ref 3.5–5.1)
Sodium: 137 mmol/L (ref 135–145)
TOTAL PROTEIN: 6.6 g/dL (ref 6.5–8.1)

## 2016-07-01 LAB — LIPASE, BLOOD: LIPASE: 50 U/L (ref 11–51)

## 2016-07-01 LAB — TROPONIN I
Troponin I: <0.03 ng/mL (ref <0.03)
Troponin I: <0.03 ng/mL (ref <0.03)

## 2016-07-01 LAB — CBG MONITORING, ED: GLUCOSE-CAPILLARY: 130 mg/dL — AB (ref 65–99)

## 2016-07-01 LAB — D-DIMER, QUANTITATIVE (NOT AT ARMC): D DIMER QUANT: 5.24 ug{FEU}/mL — AB (ref 0.00–0.50)

## 2016-07-01 MED ORDER — IOPAMIDOL (ISOVUE-370) INJECTION 76%
100.0000 mL | Freq: Once | INTRAVENOUS | Status: AC | PRN
  Administered 2016-07-01: 100 mL via INTRAVENOUS

## 2016-07-01 MED ORDER — ONDANSETRON HCL 4 MG/2ML IJ SOLN
4.0000 mg | Freq: Once | INTRAMUSCULAR | Status: AC
  Administered 2016-07-01: 4 mg via INTRAVENOUS
  Filled 2016-07-01: qty 2

## 2016-07-01 MED ORDER — MORPHINE SULFATE (PF) 4 MG/ML IV SOLN
4.0000 mg | Freq: Once | INTRAVENOUS | Status: AC
  Administered 2016-07-01: 4 mg via INTRAVENOUS
  Filled 2016-07-01: qty 1

## 2016-07-01 NOTE — ED Triage Notes (Signed)
CP above lt breast x 2 days, also sweating on and off.  Given 324mg  asa and 2 sublingual nitros by ems with no pain relief

## 2016-07-01 NOTE — Discharge Instructions (Signed)
Repeat CT scan in 3 months for pulmonary nodule.

## 2016-07-01 NOTE — ED Provider Notes (Signed)
[facility]-EMER[facility]ENCY DEPT Provider Note   CSN: [id number] Arrival date & time: 07/01/16  1320     History   Chief Complaint Chief Complaint  Patient presents with  . Chest Pain    HPI [name] is a 71 y.o. female.  Pt presents to the ED today with CP to left chest.  Pt noticed it yesterday.  It is associated with SOB.  She said that it hurts to touch and to move.  She was given asa by EMS.  Nitro SL did not help pain.      Past Medical History:  Diagnosis Date  . Anemia   . Anxiety   . Arthritis   . Azotemia   . Chest pain   . Chicken pox   . Depressive disorder   . Dizziness   . Fall 06/15/2011   Caused discomfort in lower left rib and left lateral hip area  . GERD (gastroesophageal reflux disease)   . H/O: hysterectomy 1985  . History of MRSA infection   . Hypertension   . Hypokalemia   . Left trimalleolar fracture   . Leukocytosis   . OCD (obsessive compulsive disorder)   . Opioid abuse   . Pre-syncope   . Respiratory failure (HCC)   . Severe sepsis (HCC)   . SOB (shortness of breath) on exertion     Patient Active Problem List   Diagnosis Date Noted  . Avascular necrosis of hip, left (HCC) 06/06/2016  . Painful orthopaedic hardware (HCC) 06/06/2016  . Hyperkalemia 12/15/2015  . GERD (gastroesophageal reflux disease) 12/15/2015  . Staphylococcus aureus bacteremia   . Postoperative infection   . Acute blood loss anemia 03/01/2015  . Hip pain, acute   . Postop check   . Wound cellulitis   . Wound dehiscence 02/27/2015  . Tachycardia 02/27/2015  . Deep incisional surgical site infection 02/27/2015  . History of diastolic dysfunction 12/25/2014  . Closed left hip fracture (HCC) 12/25/2014  . History of sinus tachycardia 12/25/2014  . Anxiety 12/25/2014  . PNA (pneumonia) 12/25/2014  . Fracture, intertrochanteric, left femur (HCC) 12/25/2014  . Abnormal chest x-ray   . Sinus tachycardia 12/16/2014  . Right ventricular hypertrophy 12/16/2014  .  Left Trimalleolar fracture with displacement of the ankle joint S/P ORIF 11/26/2014  . Essential hypertension 11/26/2014  . Encounter for central line care 11/26/2014  . HCAP (healthcare-associated pneumonia) 11/26/2014  . Leukocytosis 11/26/2014  . Chronic depression 11/26/2014  . Esophageal reflux 11/26/2014  . Protein-calorie malnutrition (HCC) 11/26/2014  . Chest pain 08/07/2014  . Anemia 08/07/2014  . AKI (acute kidney injury) (HCC) 08/07/2014  . Hyperglycemia 08/07/2014  . Hyponatremia 08/07/2014  . Hypoxia 05/18/2014  . Angina decubitus (HCC) 07/20/2011  . Palpitations 07/20/2011  . Hypercholesterolemia 07/20/2011  . Family history of coronary artery disease 07/20/2011    Past Surgical History:  Procedure Laterality Date  . APPENDECTOMY    . APPLICATION OF WOUND VAC Left 02/27/2015   Procedure: APPLICATION OF WOUND VAC;  Surgeon: Samson Frederic, MD;  Location: [facility] OR;  Service: Orthopedics;  Laterality: Left;  . GALLBLADDER SURGERY    . HARDWARE REMOVAL Left 02/27/2015   Procedure: HARDWARE REMOVAL OF ANKLE AND I AND D;  Surgeon: Samson Frederic, MD;  Location: [facility] OR;  Service: Orthopedics;  Laterality: Left;  . INTRAMEDULLARY (IM) NAIL INTERTROCHANTERIC Left 12/25/2014   Procedure: INTRAMEDULLARY (IM) NAIL INTERTROCHANTRIC;  Surgeon: Toni Arthurs, MD;  Location: [facility] OR;  Service: Orthopedics;  Laterality: Left;  . TOTAL ABDOMINAL  HYSTERECTOMY    . TOTAL HIP ARTHROPLASTY WITH HARDWARE REMOVAL Left 06/06/2016   Procedure: LEFT TOTAL HIP ARTHROPLASTY WITH HARDWARE REMOVAL;  Surgeon: Samson FredericSwinteck, Brian, MD;  Location: [facility] OR;  Service: Orthopedics;  Laterality: Left;    OB History    No data available       Home Medications    Prior to Admission medications   Medication Sig Start Date End Date Taking? Authorizing Provider  amLODipine (NORVASC) 5 MG tablet Take 1 tablet (5 mg total) by mouth daily. 12/17/15  Yes Joseph ArtVann, Jessica U, DO  apixaban (ELIQUIS) 2.5 MG TABS tablet Take 1  tablet (2.5 mg total) by mouth every 12 (twelve) hours. 06/07/16  Yes Swinteck, Arlys JohnBrian, MD  Calcium Carb-Cholecalciferol (CALCIUM 600 + D PO) Take 1 tablet by mouth daily.   Yes [provider]  capsaicin (ZOSTRIX) 0.025 % cream Apply 1 application topically daily as needed.   Yes [provider]  docusate sodium (COLACE) 100 MG capsule Take 1 capsule (100 mg total) by mouth 2 (two) times daily. 06/07/16  Yes Swinteck, Arlys JohnBrian, MD  ibuprofen (ADVIL,MOTRIN) 200 MG tablet Take 400 mg by mouth every 4 (four) hours as needed for headache or moderate pain.    Yes [provider]  metoprolol tartrate (LOPRESSOR) 25 MG tablet Take 1 tablet (25 mg total) by mouth 2 (two) times daily. 03/04/15  Yes Rhetta MuraSamtani, Jai-Gurmukh, MD  Multiple Vitamin (MULTIVITAMIN WITH MINERALS) TABS tablet Take 1 tablet by mouth daily.   Yes [provider]  pantoprazole (PROTONIX) 40 MG tablet Take 40 mg by mouth daily. Before breakfast   Yes [provider]  senna (SENOKOT) 8.6 MG TABS tablet Take 2 tablets (17.2 mg total) by mouth at bedtime. 06/07/16  Yes Swinteck, Arlys JohnBrian, MD  sertraline (ZOLOFT) 100 MG tablet Take 100 mg by mouth daily.   Yes [provider]  HYDROcodone-acetaminophen (NORCO/VICODIN) 5-325 MG tablet Take 1-2 tablets by mouth every 4 (four) hours as needed (breakthrough pain). Patient not taking: Reported on 07/01/2016 06/07/16   Samson FredericSwinteck, Brian, MD  ondansetron (ZOFRAN) 4 MG tablet Take 1 tablet (4 mg total) by mouth every 6 (six) hours as needed for nausea. Patient not taking: Reported on 07/01/2016 06/07/16   Samson FredericSwinteck, Brian, MD    Family History Family History  Problem Relation Age of Onset  . Heart attack Mother   . Colon cancer Father   . Diabetes Brother   . Heart attack Brother [id number]       MI    Social History Social History  Substance Use Topics  . Smoking status: Never Smoker  . Smokeless tobacco: Never Used  . Alcohol use No     Allergies   Sulfa  antibiotics   Review of Systems Review of Systems  Respiratory: Positive for shortness of breath.   Cardiovascular: Positive for chest pain.  All other systems reviewed and are negative.    Physical Exam Updated Vital Signs BP (!) 178/74 (BP Location: Left Arm)   Pulse (!) 107   Temp 98.4 F (36.9 C) (Oral)   Resp (!) 21   Ht 5' (1.524 m)   Wt 57.6 kg (127 lb)   SpO2 97%   BMI 24.80 kg/m   Physical Exam  Constitutional: She is oriented to person, place, and time. She appears well-developed and well-nourished.  HENT:  Head: Normocephalic and atraumatic.  Right Ear: External ear normal.  Left Ear: External ear normal.  Nose: Nose normal.  Mouth/Throat: Oropharynx is clear  and moist.  Eyes: Conjunctivae and EOM are normal. Pupils are equal, round, and reactive to light.  Neck: Normal range of motion. Neck supple.  Cardiovascular: Normal rate, regular rhythm, normal heart sounds and intact distal pulses.   Pulmonary/Chest: Effort normal and breath sounds normal.  Anterior left chest wall tenderness.  Abdominal: Soft. Bowel sounds are normal.  Musculoskeletal: Normal range of motion.  Neurological: She is alert and oriented to person, place, and time.  Skin: Skin is warm and dry.  Psychiatric: She has a normal mood and affect. Her behavior is normal. Judgment and thought content normal.  Nursing note and vitals reviewed.    ED Treatments / Results  Labs (all labs ordered are listed, but only abnormal results are displayed) Labs Reviewed  CBC - Abnormal; Notable for the following:       Result Value   RBC 3.43 (*)    Hemoglobin 10.6 (*)    HCT 31.2 (*)    RDW 15.6 (*)    All other components within normal limits  COMPREHENSIVE METABOLIC PANEL - Abnormal; Notable for the following:    Potassium 3.0 (*)    CO2 21 (*)    Glucose, Bld 109 (*)    Calcium 8.4 (*)    Alkaline Phosphatase 177 (*)    All other components within normal limits  URINALYSIS, ROUTINE W  REFLEX MICROSCOPIC - Abnormal; Notable for the following:    Protein, ur 100 (*)    Squamous Epithelial / LPF 0-5 (*)    All other components within normal limits  D-DIMER, QUANTITATIVE (NOT AT [facility]) - Abnormal; Notable for the following:    D-Dimer, Quant 5.24 (*)    All other components within normal limits  CBG MONITORING, ED - Abnormal; Notable for the following:    Glucose-Capillary 130 (*)    All other components within normal limits  TROPONIN I  LIPASE, BLOOD  TROPONIN I    EKG  EKG Interpretation  Date/Time:  Friday July 01 2016 13:26:46 EDT Ventricular Rate:  87 PR Interval:    QRS Duration: 117 QT Interval:  364 QTC Calculation: 438 R Axis:   64 Text Interpretation:  Sinus rhythm Borderline short PR interval Incomplete right bundle branch block No significant change since last tracing Confirmed by Jacalyn Lefevre [phone]) on 07/01/2016 1:36:04 PM       Radiology Dg Chest 2 View  Result Date: 07/01/2016 CLINICAL DATA:  Chest pain EXAM: CHEST  2 VIEW COMPARISON:  12/15/2015 FINDINGS: Chronic scar-like opacities at the bases and right apex. Chronic minimal blunting of the lateral right costophrenic sulcus. There is no edema, consolidation, effusion, or pneumothorax. Normal heart size. Stable mediastinal contours. IMPRESSION: 1. No acute finding. 2.  Bilateral pulmonary scarring. Electronically Signed   By: Marnee Spring M.D.   On: 07/01/2016 14:49   Ct Angio Chest Pe W Or Wo Contrast  Result Date: 07/01/2016 CLINICAL DATA:  Left side anterior chest pain for 2 days. EXAM: CT ANGIOGRAPHY CHEST WITH CONTRAST TECHNIQUE: Multidetector CT imaging of the chest was performed using the standard protocol during bolus administration of intravenous contrast. Multiplanar CT image reconstructions and MIPs were obtained to evaluate the vascular anatomy. CONTRAST:  100 cc Isovue 370 COMPARISON:  12/25/2014 FINDINGS: Cardiovascular: Heart is enlarged. No pericardial effusion. Coronary artery  calcification is noted. Atherosclerotic calcification is noted in the wall of the thoracic aorta. No filling defect in the opacified pulmonary arteries to suggest the presence of an acute pulmonary embolus. Mediastinum/Nodes: No  mediastinal lymphadenopathy. There is no hilar lymphadenopathy. The esophagus has normal imaging features. There is no axillary lymphadenopathy. Lungs/Pleura: Architectural distortion in pleural-parenchymal scarring in the lung apices again noted with interval improvement in the associated airspace consolidations seen previously. Lower lobe scarring again noted. Bilateral pleural effusions present on the previous study have resolved in the interval. 5 mm left lower lobe pulmonary nodule seen image 53 series 6. 16 x 10 mm nodule in the left upper lung is associated with the chronic atelectasis or scarring. Upper Abdomen: Intra and extrahepatic biliary duct dilatation as present on prior study. Musculoskeletal: Bone windows reveal no worrisome lytic or sclerotic osseous lesions. Review of the MIP images confirms the above findings. IMPRESSION: 1. No CT evidence for acute pulmonary embolus. 2. Architectural distortion/scarring identified in both lungs. The apical airspace consolidations seen on the prior study appears improved in the interval although there is a persistent masslike 16 x 10 mm nodular opacity associated with the left apical disease. Consider repeat CT chest without contrast in 3 months to assess stability. 3. Coronary artery and thoracic aortic atherosclerosis. Electronically Signed   By: Kennith Center M.D.   On: 07/01/2016 17:59    Procedures Procedures (including critical care time)  Medications Ordered in ED Medications  ondansetron (ZOFRAN) injection 4 mg (4 mg Intravenous Given 07/01/16 1338)  morphine 4 MG/ML injection 4 mg (4 mg Intravenous Given 07/01/16 1338)  morphine 4 MG/ML injection 4 mg (4 mg Intravenous Given 07/01/16 1545)  iopamidol (ISOVUE-370) 76 %  injection 100 mL (100 mLs Intravenous Contrast Given 07/01/16 1736)     Initial Impression / Assessment and Plan / ED Course  I have reviewed the triage vital signs and the nursing notes.  Pertinent labs & imaging results that were available during my care of the patient were reviewed by me and considered in my medical decision making (see chart for details).    Pt's CP is pleuritic.  Her cp has improved.  She was admitted in November for similar complaints.  She has had 2 negative troponins.  She is able to ambulate without difficulty.    Pt told about pulmonary nodule and the need for f/u.  She knows to return if worse.  Final Clinical Impressions(s) / ED Diagnoses   Final diagnoses:  Chest pain  Pulmonary nodule    New Prescriptions New Prescriptions   No medications on file     Jacalyn Lefevre, MD 07/01/16 1930

## 2016-07-01 NOTE — ED Notes (Signed)
Ambulated pt around nurses station. O2 sats fluctuated between 97-100% during ambulation. Pt stated that she had no complaints

## 2016-07-08 ENCOUNTER — Emergency Department (HOSPITAL_COMMUNITY): Payer: Medicare HMO

## 2016-07-08 ENCOUNTER — Emergency Department (HOSPITAL_COMMUNITY)
Admission: EM | Admit: 2016-07-08 | Discharge: 2016-07-08 | Disposition: A | Discharge status: Home / Self Care | Payer: Medicare HMO | Attending: Emergency Medicine | Admitting: Emergency Medicine

## 2016-07-08 ENCOUNTER — Encounter (HOSPITAL_COMMUNITY): Payer: Self-pay | Admitting: Emergency Medicine

## 2016-07-08 DIAGNOSIS — Y998 Other external cause status: Secondary | ICD-10-CM | POA: Insufficient documentation

## 2016-07-08 DIAGNOSIS — S79912A Unspecified injury of left hip, initial encounter: Secondary | ICD-10-CM | POA: Diagnosis present

## 2016-07-08 DIAGNOSIS — T84021A Dislocation of internal left hip prosthesis, initial encounter: Secondary | ICD-10-CM | POA: Insufficient documentation

## 2016-07-08 DIAGNOSIS — Z79899 Other long term (current) drug therapy: Secondary | ICD-10-CM | POA: Insufficient documentation

## 2016-07-08 DIAGNOSIS — I10 Essential (primary) hypertension: Secondary | ICD-10-CM | POA: Diagnosis not present

## 2016-07-08 DIAGNOSIS — Y33XXXA Other specified events, undetermined intent, initial encounter: Secondary | ICD-10-CM | POA: Insufficient documentation

## 2016-07-08 DIAGNOSIS — E7800 Pure hypercholesterolemia, unspecified: Secondary | ICD-10-CM | POA: Insufficient documentation

## 2016-07-08 DIAGNOSIS — Y829 Unspecified medical devices associated with adverse incidents: Secondary | ICD-10-CM | POA: Diagnosis not present

## 2016-07-08 DIAGNOSIS — Y9301 Activity, walking, marching and hiking: Secondary | ICD-10-CM | POA: Diagnosis not present

## 2016-07-08 DIAGNOSIS — Y929 Unspecified place or not applicable: Secondary | ICD-10-CM | POA: Insufficient documentation

## 2016-07-08 DIAGNOSIS — F419 Anxiety disorder, unspecified: Secondary | ICD-10-CM | POA: Insufficient documentation

## 2016-07-08 DIAGNOSIS — S73005A Unspecified dislocation of left hip, initial encounter: Secondary | ICD-10-CM

## 2016-07-08 MED ORDER — FENTANYL CITRATE (PF) 100 MCG/2ML IJ SOLN
50.0000 ug | Freq: Once | INTRAMUSCULAR | Status: DC
Start: 2016-07-08 — End: 2016-07-08
  Filled 2016-07-08: qty 2

## 2016-07-08 MED ORDER — FENTANYL CITRATE (PF) 100 MCG/2ML IJ SOLN
50.0000 ug | Freq: Once | INTRAMUSCULAR | Status: AC
  Administered 2016-07-08: 50 ug via INTRAVENOUS
  Filled 2016-07-08: qty 2

## 2016-07-08 MED ORDER — PROPOFOL 10 MG/ML IV BOLUS
1.0000 mg/kg | Freq: Once | INTRAVENOUS | Status: AC
  Administered 2016-07-08: 50 mg via INTRAVENOUS
  Filled 2016-07-08: qty 20

## 2016-07-08 MED ORDER — HYDROCODONE-ACETAMINOPHEN 5-325 MG PO TABS
1.0000 | ORAL_TABLET | Freq: Once | ORAL | Status: AC
  Administered 2016-07-08: 1 via ORAL
  Filled 2016-07-08: qty 1

## 2016-07-08 MED ORDER — FENTANYL CITRATE (PF) 100 MCG/2ML IJ SOLN
100.0000 ug | Freq: Once | INTRAMUSCULAR | Status: AC
  Administered 2016-07-08: 100 ug via INTRAVENOUS
  Filled 2016-07-08: qty 2

## 2016-07-08 NOTE — ED Triage Notes (Signed)
Patient from home with [facility] after feeling a pop and sudden severe left hip pain while walking down a flight of steps.  Patient had a hip replacement one month ago.  Obvious rotation to left leg, pulse and sensation intact distal to injury.  Patient alert and oriented at this time.  No IV established from EMS.

## 2016-07-08 NOTE — Progress Notes (Signed)
Orthopedic Tech Progress Note Patient Details:  Madison AbtsJudy L [name] 03/29/1945 [id number]  Ortho Devices Type of Ortho Device: Knee Immobilizer Ortho Device/Splint Interventions: Application   Madison FordyceJennifer C Nedra [name] 07/08/2016, 2:46 PM

## 2016-07-08 NOTE — ED Provider Notes (Signed)
[facility]-EMERGENCY DEPT Provider Note   CSN: [id number] Arrival date & time: 07/08/16  1322  By signing my name below, I, Rosana Fret, attest that this documentation has been prepared under the direction and in the presence of non-physician practitioner, Audry Pili, PA-C. Electronically Signed: Rosana Fret, ED Scribe. 07/08/16. 1:56 PM.  History   Chief Complaint Chief Complaint  Patient presents with  . Hip Pain   The history is provided by the patient. No language interpreter was used.   HPI Comments: [name] is a 71 y.o. female who presents to the Emergency Department via EMS complaining of sudden onset, constant left hip pain onset 3 hours ago. Pt states she was walking down the stairs and felt her hip pop. No fall or trauma. Per pt, she had a posterior hip replacement 1 month ago by Dr. Linna Caprice. Pt states pain is exacerbated by movement. Rates pain 10/10. Pt takes eliquis following her surgery. Three tablets left. Pt denies hx of DVT/PE. No other complaints at this time.  Past Medical History:  Diagnosis Date  . Anemia   . Anxiety   . Arthritis   . Azotemia   . Chest pain   . Chicken pox   . Depressive disorder   . Dizziness   . Fall 06/15/2011   Caused discomfort in lower left rib and left lateral hip area  . GERD (gastroesophageal reflux disease)   . H/O: hysterectomy 1985  . History of MRSA infection   . Hypertension   . Hypokalemia   . Left trimalleolar fracture   . Leukocytosis   . OCD (obsessive compulsive disorder)   . Opioid abuse   . Pre-syncope   . Respiratory failure (HCC)   . Severe sepsis (HCC)   . SOB (shortness of breath) on exertion     Patient Active Problem List   Diagnosis Date Noted  . Avascular necrosis of hip, left (HCC) 06/06/2016  . Painful orthopaedic hardware (HCC) 06/06/2016  . Hyperkalemia 12/15/2015  . GERD (gastroesophageal reflux disease) 12/15/2015  . Staphylococcus aureus bacteremia   . Postoperative infection   .  Acute blood loss anemia 03/01/2015  . Hip pain, acute   . Postop check   . Wound cellulitis   . Wound dehiscence 02/27/2015  . Tachycardia 02/27/2015  . Deep incisional surgical site infection 02/27/2015  . History of diastolic dysfunction 12/25/2014  . Closed left hip fracture (HCC) 12/25/2014  . History of sinus tachycardia 12/25/2014  . Anxiety 12/25/2014  . PNA (pneumonia) 12/25/2014  . Fracture, intertrochanteric, left femur (HCC) 12/25/2014  . Abnormal chest x-ray   . Sinus tachycardia 12/16/2014  . Right ventricular hypertrophy 12/16/2014  . Left Trimalleolar fracture with displacement of the ankle joint S/P ORIF 11/26/2014  . Essential hypertension 11/26/2014  . Encounter for central line care 11/26/2014  . HCAP (healthcare-associated pneumonia) 11/26/2014  . Leukocytosis 11/26/2014  . Chronic depression 11/26/2014  . Esophageal reflux 11/26/2014  . Protein-calorie malnutrition (HCC) 11/26/2014  . Chest pain 08/07/2014  . Anemia 08/07/2014  . AKI (acute kidney injury) (HCC) 08/07/2014  . Hyperglycemia 08/07/2014  . Hyponatremia 08/07/2014  . Hypoxia 05/18/2014  . Angina decubitus (HCC) 07/20/2011  . Palpitations 07/20/2011  . Hypercholesterolemia 07/20/2011  . Family history of coronary artery disease 07/20/2011    Past Surgical History:  Procedure Laterality Date  . APPENDECTOMY    . APPLICATION OF WOUND VAC Left 02/27/2015   Procedure: APPLICATION OF WOUND VAC;  Surgeon: Samson Frederic, MD;  Location: [facility] OR;  Service: Orthopedics;  Laterality: Left;  . GALLBLADDER SURGERY    . HARDWARE REMOVAL Left 02/27/2015   Procedure: HARDWARE REMOVAL OF ANKLE AND I AND D;  Surgeon: Samson FredericBrian Swinteck, MD;  Location: [facility] OR;  Service: Orthopedics;  Laterality: Left;  . INTRAMEDULLARY (IM) NAIL INTERTROCHANTERIC Left 12/25/2014   Procedure: INTRAMEDULLARY (IM) NAIL INTERTROCHANTRIC;  Surgeon: Toni ArthursJohn Hewitt, MD;  Location: [facility] OR;  Service: Orthopedics;  Laterality: Left;  . TOTAL  ABDOMINAL HYSTERECTOMY    . TOTAL HIP ARTHROPLASTY WITH HARDWARE REMOVAL Left 06/06/2016   Procedure: LEFT TOTAL HIP ARTHROPLASTY WITH HARDWARE REMOVAL;  Surgeon: Samson FredericSwinteck, Brian, MD;  Location: [facility] OR;  Service: Orthopedics;  Laterality: Left;    OB History    No data available       Home Medications    Prior to Admission medications   Medication Sig Start Date End Date Taking? Authorizing Provider  amLODipine (NORVASC) 5 MG tablet Take 1 tablet (5 mg total) by mouth daily. 12/17/15   Joseph ArtVann, Jessica U, DO  apixaban (ELIQUIS) 2.5 MG TABS tablet Take 1 tablet (2.5 mg total) by mouth every 12 (twelve) hours. 06/07/16   Swinteck, Arlys JohnBrian, MD  Calcium Carb-Cholecalciferol (CALCIUM 600 + D PO) Take 1 tablet by mouth daily.    [provider]  capsaicin (ZOSTRIX) 0.025 % cream Apply 1 application topically daily as needed.    [provider]  docusate sodium (COLACE) 100 MG capsule Take 1 capsule (100 mg total) by mouth 2 (two) times daily. 06/07/16   Swinteck, Arlys JohnBrian, MD  HYDROcodone-acetaminophen (NORCO/VICODIN) 5-325 MG tablet Take 1-2 tablets by mouth every 4 (four) hours as needed (breakthrough pain). Patient not taking: Reported on 07/01/2016 06/07/16   Samson FredericSwinteck, Brian, MD  ibuprofen (ADVIL,MOTRIN) 200 MG tablet Take 400 mg by mouth every 4 (four) hours as needed for headache or moderate pain.     [provider]  metoprolol tartrate (LOPRESSOR) 25 MG tablet Take 1 tablet (25 mg total) by mouth 2 (two) times daily. 03/04/15   Rhetta MuraSamtani, Jai-Gurmukh, MD  Multiple Vitamin (MULTIVITAMIN WITH MINERALS) TABS tablet Take 1 tablet by mouth daily.    [provider]  ondansetron (ZOFRAN) 4 MG tablet Take 1 tablet (4 mg total) by mouth every 6 (six) hours as needed for nausea. Patient not taking: Reported on 07/01/2016 06/07/16   Samson FredericSwinteck, Brian, MD  pantoprazole (PROTONIX) 40 MG tablet Take 40 mg by mouth daily. Before breakfast    [provider]  senna (SENOKOT) 8.6 MG  TABS tablet Take 2 tablets (17.2 mg total) by mouth at bedtime. 06/07/16   Swinteck, Arlys JohnBrian, MD  sertraline (ZOLOFT) 100 MG tablet Take 100 mg by mouth daily.    [provider]    Family History Family History  Problem Relation Age of Onset  . Heart attack Mother   . Colon cancer Father   . Diabetes Brother   . Heart attack Brother [id number]       MI    Social History Social History  Substance Use Topics  . Smoking status: Never Smoker  . Smokeless tobacco: Never Used  . Alcohol use No     Allergies   Sulfa antibiotics   Review of Systems Review of Systems All other systems reviewed and are negative for acute change except as noted in the HPI.   Physical Exam Updated Vital Signs Pulse 92   Temp 97.8 F (36.6 C)   Ht 5\' 2"  (1.575 m)   Wt 127 lb (57.6 kg)  SpO2 100%   BMI 23.23 kg/m   Physical Exam  Constitutional: She is oriented to person, place, and time. Vital signs are normal. She appears well-developed and well-nourished.  HENT:  Head: Normocephalic and atraumatic.  Right Ear: Hearing normal.  Left Ear: Hearing normal.  Eyes: Conjunctivae and EOM are normal. Pupils are equal, round, and reactive to light.  Neck: Normal range of motion. Neck supple.  Cardiovascular: Normal rate, regular rhythm, normal heart sounds and intact distal pulses.   Pulmonary/Chest: Effort normal and breath sounds normal.  Abdominal: Soft. There is no tenderness.  Musculoskeletal: She exhibits deformity.  Left hip with obvious deformity. Internally rotated with leg shortening. No external injuries noted. No hematoma. No ecchymosis. NVI. Distal pulse is appreciated. Limited mobility due to pain.  Neurological: She is alert and oriented to person, place, and time.  Skin: Skin is warm and dry.  Psychiatric: She has a normal mood and affect. Her speech is normal and behavior is normal. Thought content normal.  Nursing note and vitals reviewed.  ED Treatments / Results  DIAGNOSTIC  STUDIES: Oxygen Saturation is 100% on RA, normal by my interpretation.   COORDINATION OF CARE: 1:48 PM-Discussed next steps with pt including XR. Pt verbalized understanding and is agreeable with the plan.   Labs (all labs ordered are listed, but only abnormal results are displayed) Labs Reviewed - No data to display  EKG  EKG Interpretation None       Radiology Dg Hip Unilat With Pelvis 2-3 Views Left  Result Date: 07/08/2016 CLINICAL DATA:  Left hip pain EXAM: DG HIP (WITH OR WITHOUT PELVIS) 2-3V LEFT COMPARISON:  06/06/2016 FINDINGS: Left hip prosthesis is noted. The femoral component has dislocated from the acetabular component superiorly. The pelvic ring is intact. No other focal abnormality is noted. IMPRESSION: Dislocation of the femoral component of the hip prosthesis on the left. Electronically Signed   By: Alcide Clever M.D.   On: 07/08/2016 14:18    Procedures Reduction of hip dislocation Date/Time: 07/08/2016 3:32 PM Performed by: Audry Pili Authorized by: Audry Pili  Consent: Verbal consent obtained. Written consent obtained. Consent given by: patient Patient understanding: patient states understanding of the procedure being performed Patient consent: the patient's understanding of the procedure matches consent given Procedure consent: procedure consent matches procedure scheduled Relevant documents: relevant documents present and verified Required items: required blood products, implants, devices, and special equipment available Patient identity confirmed: verbally with patient and arm band Time out: Immediately prior to procedure a "time out" was called to verify the correct patient, procedure, equipment, support staff and site/side marked as required.  Sedation: Patient sedated: yes Sedatives: propofol Vitals: Vital signs were monitored during sedation. Patient tolerance: Patient tolerated the procedure well with no immediate complications Comments:  Successful reduction of left hip dislocation    (including critical care time)  Medications Ordered in ED Medications - No data to display   Initial Impression / Assessment and Plan / ED Course  I have reviewed the triage vital signs and the nursing notes.  Pertinent labs & imaging results that were available during my care of the patient were reviewed by me and considered in my medical decision making (see chart for details).  Final Clinical Impressions(s) / ED Diagnoses   {I have reviewed and evaluated the relevant imaging studies.  {I have reviewed the relevant previous healthcare records.  {I obtained HPI from historian. {Patient discussed with supervising physician.  ED Course:  Assessment: Patient with mechanical fall.  Left Hip pain noted. EMS noted obvious deformity. X-Ray shows superior dislocation of prothesis. No fracture. Procedural sedation performed with reduction in ED.  Pt advised to follow up with orthopedics. Patient given brace while in ED, conservative therapy recommended and discussed. Patient will be discharged home & is agreeable with above plan. Returns precautions discussed. Pt appears safe for discharge.  Disposition/Plan:  DC Home Additional Verbal discharge instructions given and discussed with patient.  Pt Instructed to f/u with Ortho in the next week for evaluation and treatment of symptoms. Return precautions given Pt acknowledges and agrees with plan  Supervising Physician Arby Barrette, MD  Final diagnoses:  Closed dislocation of left hip, initial encounter [facility])    New Prescriptions New Prescriptions   No medications on file    I personally performed the services described in this documentation, which was scribed in my presence. The recorded information has been reviewed and is accurate.    Audry Pili, PA-C 07/08/16 1534    Arby Barrette, MD 07/11/16 [phone]

## 2016-07-08 NOTE — ED Notes (Signed)
Unable to obtain IV access x 1 attempt, Left forearm

## 2016-07-08 NOTE — ED Notes (Signed)
Pt understood dc material. NAD noted. 

## 2016-07-08 NOTE — ED Triage Notes (Signed)
Patient denies LOC, states she did not fall.  She felt the pop while walking down stairs and then sat down.

## 2016-07-08 NOTE — Discharge Instructions (Addendum)
Please read and follow all provided instructions.  Your diagnoses today include:  1. Closed dislocation of left hip, initial encounter [facility](HCC)     Tests performed today include: Vital signs. See below for your results today.   Medications prescribed:  Take as prescribed   Home care instructions:  Follow any educational materials contained in this packet.  Follow-up instructions: Please follow-up with your Orthopedic provider for further evaluation of symptoms and treatment   Return instructions:  Please return to the Emergency Department if you do not get better, if you get worse, or new symptoms OR  - Fever (temperature greater than 101.7F)  - Bleeding that does not stop with holding pressure to the area    -Severe pain (please note that you may be more sore the day after your accident)  - Chest Pain  - Difficulty breathing  - Severe nausea or vomiting  - Inability to tolerate food and liquids  - Passing out  - Skin becoming red around your wounds  - Change in mental status (confusion or lethargy)  - New numbness or weakness    Please return if you have any other emergent concerns.  Additional Information:  Your vital signs today were: BP (!) 163/67    Pulse 90    Temp 97.8 F (36.6 C)    Resp 14    Ht 5\' 2"  (1.575 m)    Wt 57.6 kg (127 lb)    SpO2 100%    BMI 23.23 kg/m  If your blood pressure (BP) was elevated above 135/85 this visit, please have this repeated by your doctor within one month. ---------------

## 2016-07-12 IMAGING — MR MR ABDOMEN WO/W CM
12 of 18 series · 31 of 48 positions shown · IV contrast (multihance)
Comparison: Noncontrast chest CT on 12/25/2014

CLINICAL DATA: Indeterminate left renal lesions seen on recent
noncontrast CT.

EXAM:
MRI ABDOMEN WITHOUT AND WITH CONTRAST
TECHNIQUE: Multiplanar multisequence MR imaging of the abdomen was performed
both before and after the administration of intravenous contrast.
CONTRAST:  10mL MULTIHANCE GADOBENATE DIMEGLUMINE 529 MG/ML IV SOLN

[Series 3: T2 · coronal · 8.0mm · 1.64mm/px · 2 of 19 slices shown]
[im 1/19]
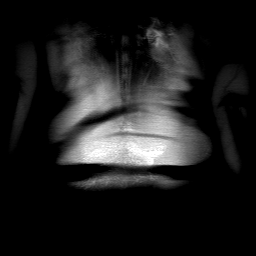
[im 19/19]
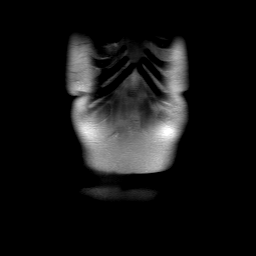

[Series 4: T2 fat-sat · axial · 8.0mm · 0.68mm/px · z∈[-77,+125]mm · 2 of 22 slices shown]
[im 1/22]
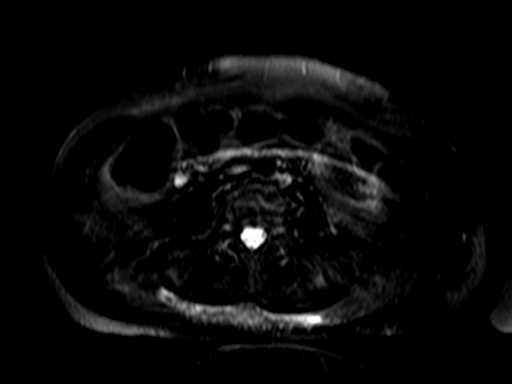
[im 22/22]
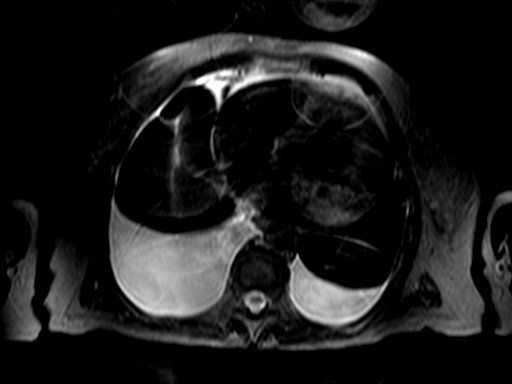

[Series 6: DWI · axial · 6.0mm · 2.97mm/px · z∈[-80,+129]mm · 6 of 90 slices shown]
[im 1/90]
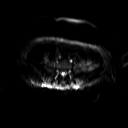
[im 18/90]
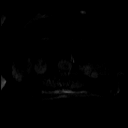
[im 36/90]
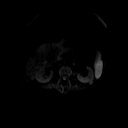
[im 54/90]
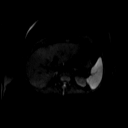
[im 72/90]
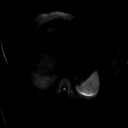
[im 90/90]
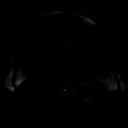

[Series 7: axial dwi_adc · axial · 6.0mm · 2.97mm/px · z∈[-80,+129]mm · 3 of 30 slices shown]
[im 1/30]
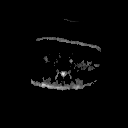
[im 15/30]
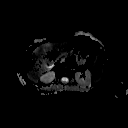
[im 30/30]
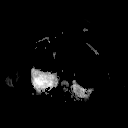

[Series 8: axial true fisp-- · axial · 4.0mm · 0.74mm/px · z∈[-76,+124]mm · 3 of 51 slices shown]
[im 1/51]
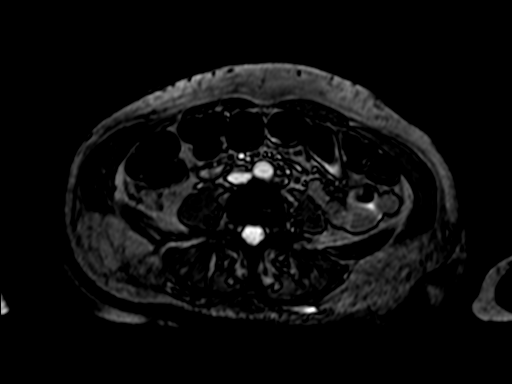
[im 26/51]
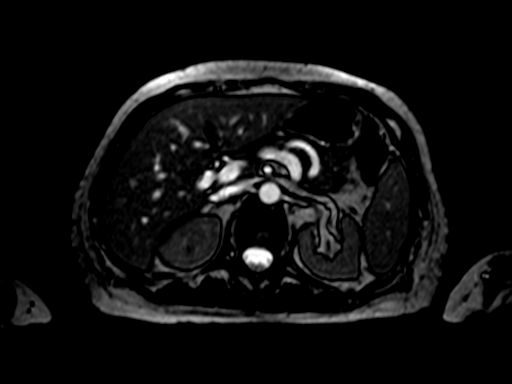
[im 51/51]
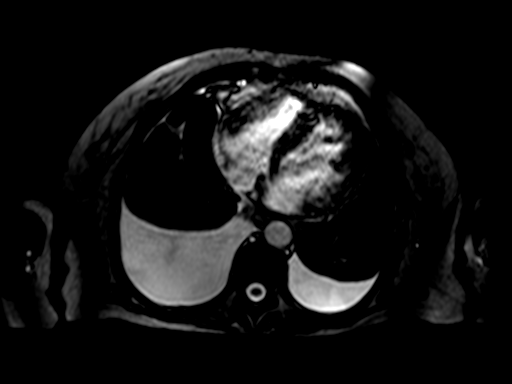

[Series 14: T2 post-contrast · axial · 8.5mm · 1.48mm/px · 1 of 22 slices shown]
[im 1/22]
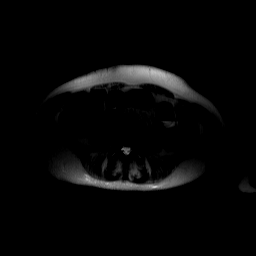

[Series 16: out of phase · axial · 8.0mm · 0.74mm/px · 1 of 22 slices shown]
[im 1/22]
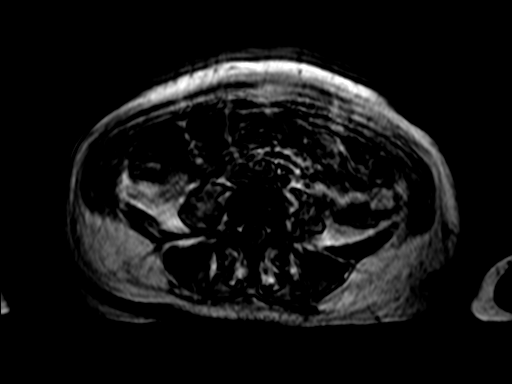

[Series 17: in phase · axial · 8.0mm · 0.74mm/px · 1 of 22 slices shown]
[im 1/22]
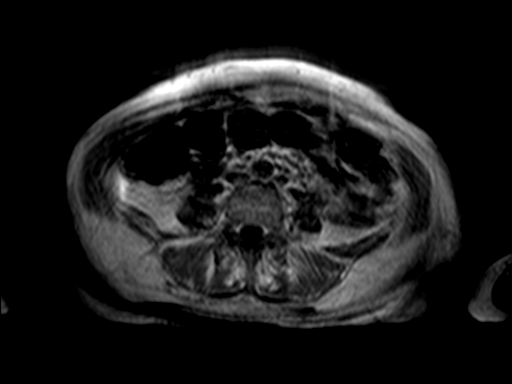

[Series 18: 23 sub · axial · 4.0mm · 0.74mm/px · z∈[-94,+142]mm · 3 of 60 slices shown]
[im 1/60]
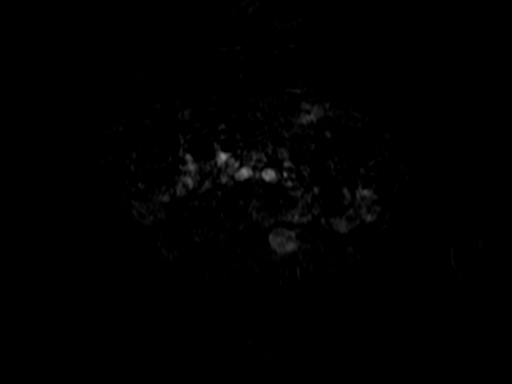
[im 30/60]
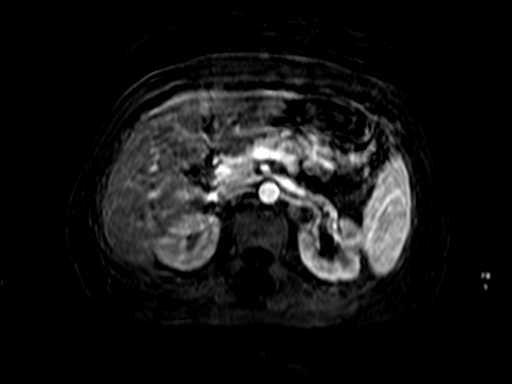
[im 60/60]
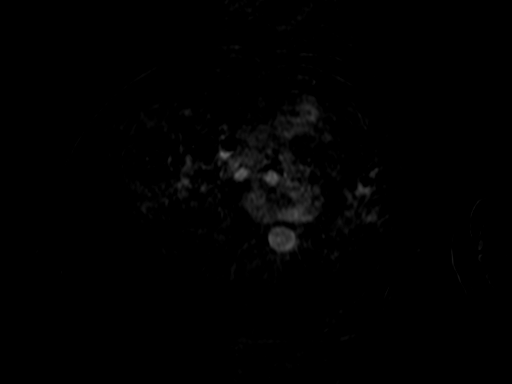

[Series 19: 45 sub · axial · 4.0mm · 0.74mm/px · z∈[-94,+142]mm · 3 of 60 slices shown]
[im 1/60]
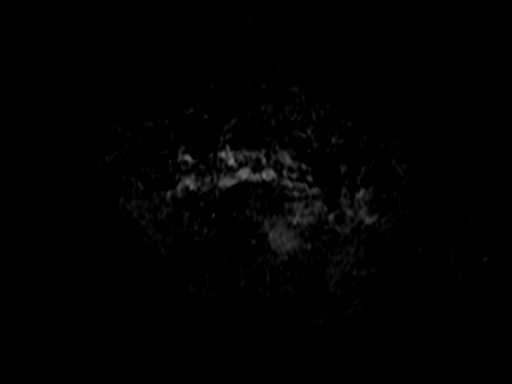
[im 30/60]
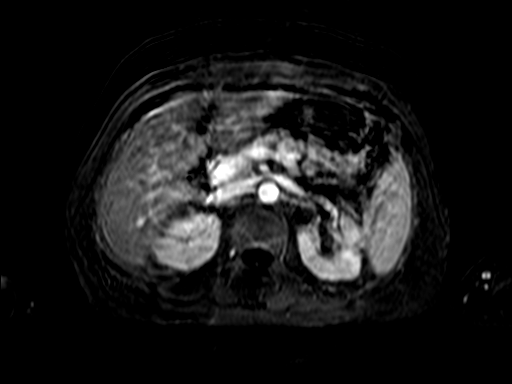
[im 60/60]
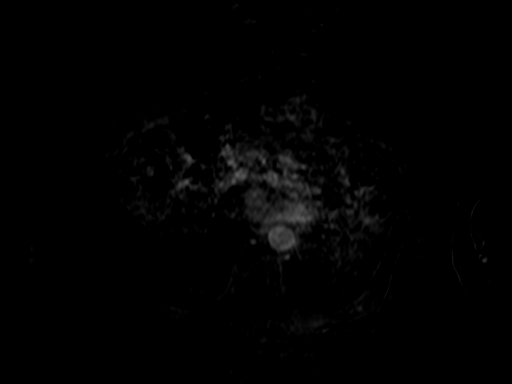

[Series 20: 90 sub · axial · 4.0mm · 0.74mm/px · z∈[-94,+142]mm · 3 of 60 slices shown]
[im 1/60]
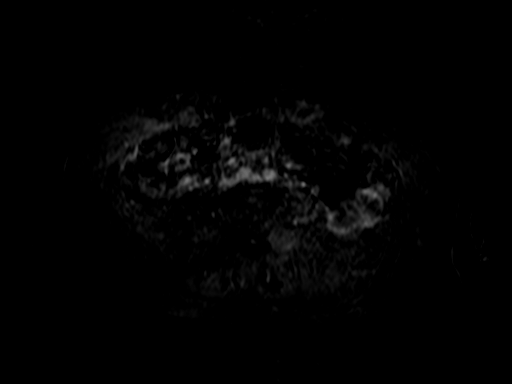
[im 30/60]
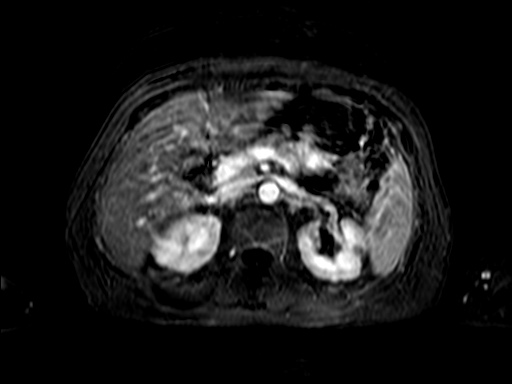
[im 60/60]
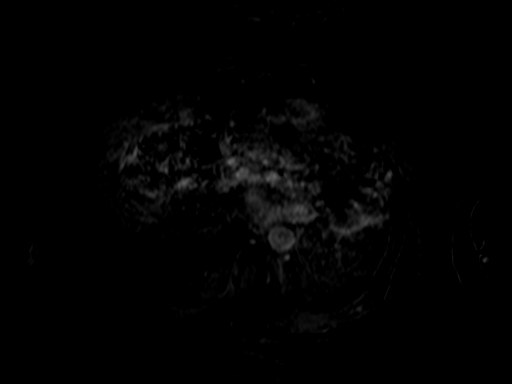

[Series 21: 3 min sub · axial · 4.0mm · 0.74mm/px · z∈[-94,+142]mm · 3 of 60 slices shown]
[im 1/60]
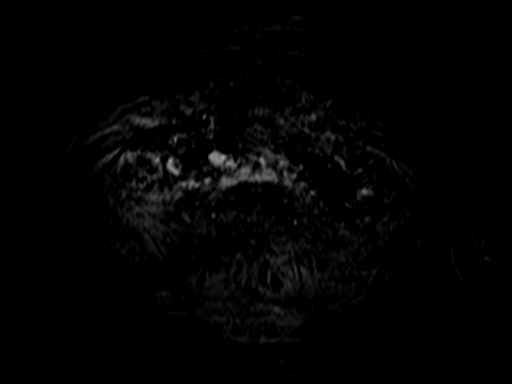
[im 30/60]
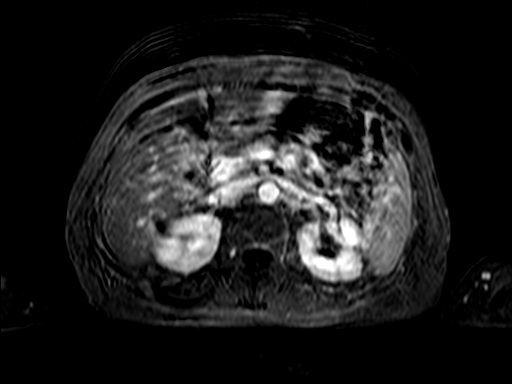
[im 60/60]
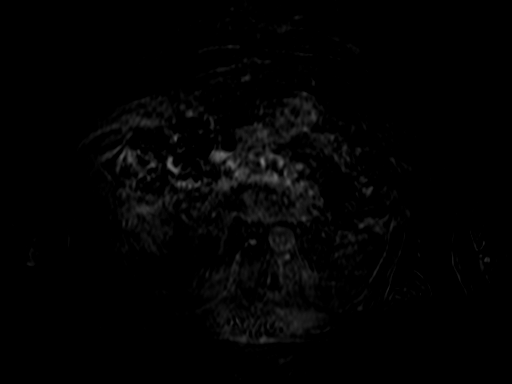

[31 of 48 positions shown; findings below may reference images not displayed]

FINDINGS: Exam partially degraded by respiratory motion artifact.

Lower chest: Moderate right and small left pleural effusions,
without significant change.

Hepatobiliary: No mass or other parenchymal abnormality identified.
Prior cholecystectomy. No significant biliary ductal dilatation.

Pancreas: No mass, inflammatory changes, or other parenchymal
abnormality identified.

Spleen:  Within normal limits in size and appearance.

Adrenals/Urinary Tract: The adrenal glands and right kidney are
normal in appearance. A simple Bosniak category 1 cyst is seen
posterior midpole of the left kidney which measures 1.3 cm. A 2.2 cm
subcapsular Bosniak category 2 hemorrhagic cyst is seen in the
lateral lower pole of the left kidney which shows no evidence of
enhancement on subtraction imaging. A 3rd 2.4 cm subcapsular cyst is
seen in the lateral lower pole left kidney which contains a layering
hematocrit level, consistent with a Bosniak category 2 hemorrhagic
cyst. Enhancing or solid renal masses are identified. No evidence of
hydronephrosis.

Stomach/Bowel: Visualized portions within the abdomen are
unremarkable.

Vascular/Lymphatic: No pathologically enlarged lymph nodes
identified. No abdominal aortic aneurysm demonstrated.

Other:  None.

Musculoskeletal:  No suspicious bone lesions identified.
IMPRESSION: Benign-appearing Bosniak category 1 and 2 left renal cysts which
correspond with the lesion seen on recent noncontrast CT. No
evidence of solid renal neoplasm or hydronephrosis.

Moderate right and small left pleural effusions.

## 2016-10-17 ENCOUNTER — Observation Stay (HOSPITAL_COMMUNITY)
Admission: EM | Admit: 2016-10-17 | Discharge: 2016-10-19 | Disposition: A | Discharge status: Home / Self Care | Payer: Medicare HMO | Attending: Internal Medicine | Admitting: Internal Medicine

## 2016-10-17 ENCOUNTER — Emergency Department (HOSPITAL_COMMUNITY): Payer: Medicare HMO

## 2016-10-17 DIAGNOSIS — S32592A Other specified fracture of left pubis, initial encounter for closed fracture: Secondary | ICD-10-CM | POA: Diagnosis not present

## 2016-10-17 DIAGNOSIS — K219 Gastro-esophageal reflux disease without esophagitis: Secondary | ICD-10-CM | POA: Insufficient documentation

## 2016-10-17 DIAGNOSIS — F329 Major depressive disorder, single episode, unspecified: Secondary | ICD-10-CM | POA: Insufficient documentation

## 2016-10-17 DIAGNOSIS — W19XXXA Unspecified fall, initial encounter: Secondary | ICD-10-CM

## 2016-10-17 DIAGNOSIS — S32492A Other specified fracture of left acetabulum, initial encounter for closed fracture: Secondary | ICD-10-CM | POA: Diagnosis present

## 2016-10-17 DIAGNOSIS — Z7982 Long term (current) use of aspirin: Secondary | ICD-10-CM | POA: Diagnosis not present

## 2016-10-17 DIAGNOSIS — Z79899 Other long term (current) drug therapy: Secondary | ICD-10-CM | POA: Insufficient documentation

## 2016-10-17 DIAGNOSIS — E785 Hyperlipidemia, unspecified: Secondary | ICD-10-CM | POA: Diagnosis not present

## 2016-10-17 DIAGNOSIS — S32409A Unspecified fracture of unspecified acetabulum, initial encounter for closed fracture: Secondary | ICD-10-CM | POA: Diagnosis present

## 2016-10-17 DIAGNOSIS — I10 Essential (primary) hypertension: Secondary | ICD-10-CM | POA: Diagnosis not present

## 2016-10-17 DIAGNOSIS — M25552 Pain in left hip: Secondary | ICD-10-CM | POA: Diagnosis present

## 2016-10-17 HISTORY — DX: Bacteremia: R78.81

## 2016-10-17 HISTORY — DX: Infection following a procedure, deep incisional surgical site, initial encounter: T81.42XA

## 2016-10-17 HISTORY — DX: Pneumonia, unspecified organism: J18.9

## 2016-10-17 HISTORY — DX: Tachycardia, unspecified: R00.0

## 2016-10-17 HISTORY — DX: Acute kidney failure, unspecified: N17.9

## 2016-10-17 HISTORY — DX: Acute posthemorrhagic anemia: D62

## 2016-10-17 HISTORY — DX: Displaced intertrochanteric fracture of left femur, initial encounter for closed fracture: S72.142A

## 2016-10-17 HISTORY — DX: Cardiomegaly: I51.7

## 2016-10-17 LAB — BASIC METABOLIC PANEL
ANION GAP: 13 (ref 5–15)
BUN: 11 mg/dL (ref 6–20)
CALCIUM: 9.3 mg/dL (ref 8.9–10.3)
CO2: 21 mmol/L — ABNORMAL LOW (ref 22–32)
CREATININE: 0.91 mg/dL (ref 0.44–1.00)
Chloride: 100 mmol/L — ABNORMAL LOW (ref 101–111)
Glucose, Bld: 102 mg/dL — ABNORMAL HIGH (ref 65–99)
Potassium: 4.2 mmol/L (ref 3.5–5.1)
Sodium: 134 mmol/L — ABNORMAL LOW (ref 135–145)

## 2016-10-17 LAB — CBC WITH DIFFERENTIAL/PLATELET
BASOS ABS: 0 10*3/uL (ref 0.0–0.1)
BASOS PCT: 0 %
EOS ABS: 0 10*3/uL (ref 0.0–0.7)
Eosinophils Relative: 0 %
HEMATOCRIT: 35.4 % — AB (ref 36.0–46.0)
Hemoglobin: 11.8 g/dL — ABNORMAL LOW (ref 12.0–15.0)
Lymphocytes Relative: 13 %
Lymphs Abs: 1.1 10*3/uL (ref 0.7–4.0)
MCH: 29.6 pg (ref 26.0–34.0)
MCHC: 33.3 g/dL (ref 30.0–36.0)
MCV: 88.9 fL (ref 78.0–100.0)
MONO ABS: 0.7 10*3/uL (ref 0.1–1.0)
MONOS PCT: 8 %
NEUTROS ABS: 6.6 10*3/uL (ref 1.7–7.7)
Neutrophils Relative %: 79 %
PLATELETS: 334 10*3/uL (ref 150–400)
RBC: 3.98 MIL/uL (ref 3.87–5.11)
RDW: 13.4 % (ref 11.5–15.5)
WBC: 8.3 10*3/uL (ref 4.0–10.5)

## 2016-10-17 LAB — PROTIME-INR
INR: 1.03
PROTHROMBIN TIME: 13.4 s (ref 11.4–15.2)

## 2016-10-17 LAB — TYPE AND SCREEN
ABO/RH(D): A POS
Antibody Screen: NEGATIVE

## 2016-10-17 MED ORDER — ACETAMINOPHEN 650 MG RE SUPP: 650.0000 mg | Freq: Four times a day (QID) | RECTAL | Status: DC | PRN

## 2016-10-17 MED ORDER — HYDROCODONE-ACETAMINOPHEN 5-325 MG PO TABS
1.0000 | ORAL_TABLET | Freq: Four times a day (QID) | ORAL | Status: DC | PRN
  Administered 2016-10-17 – 2016-10-18 (×3): 1 via ORAL
  Filled 2016-10-17 (×3): qty 1

## 2016-10-17 MED ORDER — MORPHINE SULFATE (PF) 4 MG/ML IV SOLN
2.0000 mg | Freq: Once | INTRAVENOUS | Status: AC
  Administered 2016-10-17: 2 mg via INTRAVENOUS
  Filled 2016-10-17: qty 1

## 2016-10-17 MED ORDER — ONDANSETRON HCL 4 MG/2ML IJ SOLN: 4.0000 mg | Freq: Four times a day (QID) | INTRAMUSCULAR | Status: DC | PRN

## 2016-10-17 MED ORDER — FENTANYL CITRATE (PF) 100 MCG/2ML IJ SOLN
50.0000 ug | INTRAMUSCULAR | Status: AC | PRN
  Administered 2016-10-17 (×2): 50 ug via INTRAVENOUS
  Filled 2016-10-17 (×2): qty 2

## 2016-10-17 MED ORDER — ACETAMINOPHEN 325 MG PO TABS: 650.0000 mg | ORAL_TABLET | Freq: Four times a day (QID) | ORAL | Status: DC | PRN

## 2016-10-17 MED ORDER — ONDANSETRON HCL 4 MG PO TABS: 4.0000 mg | ORAL_TABLET | Freq: Four times a day (QID) | ORAL | Status: DC | PRN

## 2016-10-17 MED ORDER — ENOXAPARIN SODIUM 40 MG/0.4ML ~~LOC~~ SOLN
40.0000 mg | SUBCUTANEOUS | Status: DC
  Administered 2016-10-17 – 2016-10-18 (×2): 40 mg via SUBCUTANEOUS
  Filled 2016-10-17 (×3): qty 0.4

## 2016-10-17 MED ORDER — METOPROLOL TARTRATE 25 MG PO TABS
25.0000 mg | ORAL_TABLET | Freq: Two times a day (BID) | ORAL | Status: DC
  Administered 2016-10-17 – 2016-10-19 (×4): 25 mg via ORAL
  Filled 2016-10-17 (×4): qty 1

## 2016-10-17 MED ORDER — PANTOPRAZOLE SODIUM 40 MG PO TBEC
40.0000 mg | DELAYED_RELEASE_TABLET | Freq: Every day | ORAL | Status: DC
  Administered 2016-10-18 – 2016-10-19 (×2): 40 mg via ORAL
  Filled 2016-10-17 (×2): qty 1

## 2016-10-17 MED ORDER — IBUPROFEN 200 MG PO TABS
400.0000 mg | ORAL_TABLET | Freq: Four times a day (QID) | ORAL | Status: DC | PRN
  Administered 2016-10-18 (×2): 400 mg via ORAL
  Filled 2016-10-17 (×2): qty 2

## 2016-10-17 MED ORDER — SENNA 8.6 MG PO TABS
1.0000 | ORAL_TABLET | Freq: Two times a day (BID) | ORAL | Status: DC
  Administered 2016-10-17 – 2016-10-18 (×3): 8.6 mg via ORAL
  Filled 2016-10-17 (×6): qty 1

## 2016-10-17 MED ORDER — DOCUSATE SODIUM 100 MG PO CAPS
100.0000 mg | ORAL_CAPSULE | Freq: Two times a day (BID) | ORAL | Status: DC
  Administered 2016-10-17 – 2016-10-18 (×3): 100 mg via ORAL
  Filled 2016-10-17 (×5): qty 1

## 2016-10-17 MED ORDER — ASPIRIN 325 MG PO TABS
325.0000 mg | ORAL_TABLET | Freq: Every day | ORAL | Status: DC
  Administered 2016-10-18 – 2016-10-19 (×2): 325 mg via ORAL
  Filled 2016-10-17 (×2): qty 1

## 2016-10-17 MED ORDER — SERTRALINE HCL 100 MG PO TABS
100.0000 mg | ORAL_TABLET | Freq: Every day | ORAL | Status: DC
  Administered 2016-10-18 – 2016-10-19 (×2): 100 mg via ORAL
  Filled 2016-10-17 (×2): qty 1

## 2016-10-17 MED ORDER — AMLODIPINE BESYLATE 5 MG PO TABS
5.0000 mg | ORAL_TABLET | Freq: Every day | ORAL | Status: DC
  Administered 2016-10-18 – 2016-10-19 (×2): 5 mg via ORAL
  Filled 2016-10-17 (×2): qty 1

## 2016-10-17 NOTE — H&P (Signed)
Date: 10/17/2016               Patient Name:  [name] MRN: [id number]  DOB: January 18, 1946 Age / Sex: 71 y.o., female   PCP: [facility]         Medical Service: Internal Medicine Teaching Service         Attending Physician: Dr. Rogelia Boga, Austin Miles, MD    First Contact: Dr. Rogue Bussing  Pager: [phone]  Second Contact: Dr. Johnny Bridge Pager: [phone]       After Hours (After 5p/  First Contact Pager: [phone]  weekends / holidays): Second Contact Pager: [phone]   Chief Complaint: L hip pain   History of Present Illness:  [name] is a 71 yo female with history of left hip hemiarthroplasty converted to left total hip replacement 05/2016 secondary to left avascular necrosis, HTN, HLD, depression, GERD who presents to the ED with left hip pain after a mechanical fall and was found to have a L displaced medial left acetabular fracture. Patient accompanied by daughter who provides part of the history. Patient complained of L hip on Wednesday. Daughter was planning to make outpatient follow-up appointment, but patient fell on Friday 9/14. Patient states she was trying to sit down in a chair when she slipped and fell on her left side. Denied LOC and head trauma. She has been complaining of left hip pain since then. Has tried ibuprofen at home with minimal relief. This morning she was not able to bear weight on left leg and was brought to the ED. She has no other complaints. Denies fever, chills, recent illness, chest pain, palpitations, shortness of breath, abdominal pain, N/V, changes in BM, and lower extremity swelling.   ED course: Patient was hemodynamically stable on arrival to the ED. XR and CT of L hip with L acetabular fracture. She received IV Fentanyl 50 mcg x1 and IV morphine 2 mg x1 for pain.    Review of Systems: A complete ROS was negative except as per HPI.   Meds:  Current Meds  Medication Sig  . amLODipine (NORVASC) 5 MG tablet Take 1  tablet (5 mg total) by mouth daily.  Marland Kitchen aspirin 325 MG tablet Take 325 mg by mouth daily.  . Calcium Carb-Cholecalciferol (CALCIUM 600 + D PO) Take 1 tablet by mouth daily.  Marland Kitchen ibuprofen (ADVIL,MOTRIN) 200 MG tablet Take 400 mg by mouth every 4 (four) hours as needed for headache or moderate pain.   . metoprolol tartrate (LOPRESSOR) 25 MG tablet Take 1 tablet (25 mg total) by mouth 2 (two) times daily.  . pantoprazole (PROTONIX) 40 MG tablet Take 40 mg by mouth daily. Before breakfast  . sertraline (ZOLOFT) 100 MG tablet Take 100 mg by mouth daily.     Allergies: Allergies as of 10/17/2016 - Review Complete 10/17/2016  Allergen Reaction Noted  . Sulfa antibiotics Hives 07/20/2011   Past Medical History:  Diagnosis Date  . Anemia   . Anxiety   . Arthritis   . Azotemia   . Chest pain   . Chicken pox   . Depressive disorder   . Dizziness   . Fall 06/15/2011   Caused discomfort in lower left rib and left lateral hip area  . GERD (gastroesophageal reflux disease)   . H/O: hysterectomy 1985  . History of MRSA infection   . Hypertension   . Hypokalemia   . Left trimalleolar fracture   . Leukocytosis   .  OCD (obsessive compulsive disorder)   . Opioid abuse   . Pre-syncope   . Respiratory failure (HCC)   . Severe sepsis (HCC)   . SOB (shortness of breath) on exertion     Family History:  Family History  Problem Relation Age of Onset  . Heart attack Mother   . Colon cancer Father   . Diabetes Brother   . Heart attack Brother 8       MI    Social History:  Social History  Substance Use Topics  . Smoking status: Never Smoker  . Smokeless tobacco: Never Used  . Alcohol use No    Physical Exam: Blood pressure 112/88, pulse 77, temperature 98.6 F (37 C), temperature source Oral, resp. rate (!) 23, SpO2 100 %.  General: pleasant female, well-nourished, well-developed, in pain but in no acute distress HENT: NCAT, few excoriations noted on scalp, neck supple and FROM    Cardiac: regular rate and rhythm, nl S1/S2, no murmurs, rubs or gallops  Pulm: CTAB, no wheezes or crackles, no increased work of breathing  Abd: soft, NTND, bowel sounds present  Neuro: A&Ox3, able to move all 4 extremities Ext: LLE cool to the touch, no peripheral edema, 2+ DP pulses bilaterally  Derm: bruising and excoriation of R cheek  Psych: attentive, appropriate affect, answers questions appropriately   Labs:  CBC WBC 8.3  H/H 11.8/35.4 Plt 334 BMP 134  4.2 100 21  11 0.91  102 INR 1.03  EKG: personally reviewed my interpretation is SR at 66 bpm, QRS , nl QTc, no axis deviation, no signs of acute ischemia, similar to prior EKGs  XR L  Hip: FINDINGS: Status post left total hip arthroplasty. Moderately displaced medial left acetabular fracture is now noted. Right hip appears normal. Vascular calcifications are noted.  CT L hip: IMPRESSION: 1. Acute, minimally displaced fracture of the left puboacetabular junction. Small left pelvic sidewall hematoma. 2. Acute nondisplaced fracture of the left inferior pubic ramus. 3. Postsurgical changes related to left total hip arthroplasty without evidence of hardware failure or loosening.   Assessment & Plan by Problem:  [name] is a 71 yo female with history of 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   # L acetabular hip fracture: Patient presenting with L hip pain after mechanical fall with L acetabular fracture seen on XR and CT. She is neurovascularly intact. Ortho consulted and recs as below.  - PT/OT eval  - Per Dr. Linna Caprice form ortho, weight bearing as tolerated and outpatient follow-up in 2 weeks - Norco 5-325 mg q6h PRN for pain    # Palpitations: TTE nl in 2017. Well controlled on metoprolol. -  Continue home metoprolol 25 mg BID   # HTN - Continue home amlodipine 5 mg QD  # GERD - Continue home Protonix 40 mg QD   # Depression - Continue home Zoloft 100 mg QD   F: None  E: will monitor and replete as needed N: HH diet   VTE ppx: SQ Lovenox   Code status: Full code, confirmed on admission    Dispo: Admit patient to Observation with expected length of stay less than 2 midnights.  Signed: Burna Cash, MD  Internal Medicine PGY-1  P [phone]

## 2016-10-17 NOTE — ED Triage Notes (Signed)
Pt here from home via [facility].  Pt states she "missed the couch" when she went to sit down and fell on her RIGHT hip, but her LEFT hip is hurting.  Pt has had LEFT hip replaced in May.  She states that her L hip feels out of place, but has been ambulatory with walker since injury and met ems at the door.  Pain 10/10.  VS 185/70, hr 69. O2 100, rr 17.

## 2016-10-17 NOTE — ED Provider Notes (Signed)
[facility]-EMERGENCY DEPT Provider Note   CSN: [id number] Arrival date & time: 10/17/16  4098     History   Chief Complaint No chief complaint on file.   HPI [name] is a 71 y.o. female.  HPI   Patient has a PMH of anxiety, anemia, chest pain, high cholesterol, hx of AKI, depression, hx of falls, h/o hysterectomy, hypertension, hx of opoid abuse, hx of severe sepsis saw Dr. 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 Was admitted 12/16/2015 for CP and ruled out.   She came to the ER from [facility]. She had a mechanical fall after missing the cough when she went to sit down falling on her RIGHT hip on Friday 9/14. She was able to walk with pain at first with a cane and then she required a walker and now she hurts so bad that she cannot ambulate at all due to pain on her LEFT hip. Denies right sided hip pain. l. Pain is 10/10. Denies hitting head, having neck pain or loc.  VS 185/70, hr 69. O2 100, rr 17.  Past Medical History:  Diagnosis Date  . Anemia   . Anxiety   . Arthritis   . Azotemia   . Chest pain   . Chicken pox   . Depressive disorder   . Dizziness   . Fall 06/15/2011   Caused discomfort in lower left rib and left lateral hip area  . GERD (gastroesophageal reflux disease)   . H/O: hysterectomy 1985  . History of MRSA infection   . Hypertension   . Hypokalemia   . Left trimalleolar fracture   . Leukocytosis   . OCD (obsessive compulsive disorder)   . Opioid abuse   . Pre-syncope   . Respiratory failure (HCC)   . Severe sepsis (HCC)   . SOB (shortness of breath) on exertion     Patient Active Problem List   Diagnosis Date Noted  . Avascular necrosis of hip, left (HCC) 06/06/2016  . Painful orthopaedic hardware (HCC) 06/06/2016  . Hyperkalemia 12/15/2015  . GERD (gastroesophageal reflux disease) 12/15/2015  . Staphylococcus aureus bacteremia   . Postoperative infection     . Acute blood loss anemia 03/01/2015  . Hip pain, acute   . Postop check   . Wound cellulitis   . Wound dehiscence 02/27/2015  . Tachycardia 02/27/2015  . Deep incisional surgical site infection 02/27/2015  . History of diastolic dysfunction 12/25/2014  . Closed left hip fracture (HCC) 12/25/2014  . History of sinus tachycardia 12/25/2014  . Anxiety 12/25/2014  . PNA (pneumonia) 12/25/2014  . Fracture, intertrochanteric, left femur (HCC) 12/25/2014  . Abnormal chest x-ray   . Sinus tachycardia 12/16/2014  . Right ventricular hypertrophy 12/16/2014  . Left Trimalleolar fracture with displacement of the ankle joint S/P ORIF 11/26/2014  . Essential hypertension 11/26/2014  . Encounter for central line care 11/26/2014  . HCAP (healthcare-associated pneumonia) 11/26/2014  . Leukocytosis 11/26/2014  . Chronic depression 11/26/2014  . Esophageal reflux 11/26/2014  . Protein-calorie malnutrition (HCC) 11/26/2014  . Chest pain 08/07/2014  . Anemia 08/07/2014  . AKI (acute kidney injury) (HCC) 08/07/2014  . Hyperglycemia 08/07/2014  . Hyponatremia 08/07/2014  . Hypoxia 05/18/2014  . Angina decubitus (HCC) 07/20/2011  . Palpitations 07/20/2011  . Hypercholesterolemia 07/20/2011  . Family history of coronary artery disease 07/20/2011    Past Surgical History:  Procedure Laterality Date  . APPENDECTOMY    .  APPLICATION OF WOUND VAC Left 02/27/2015   Procedure: APPLICATION OF WOUND VAC;  Surgeon: Samson Frederic, MD;  Location: [facility] OR;  Service: Orthopedics;  Laterality: Left;  . GALLBLADDER SURGERY    . HARDWARE REMOVAL Left 02/27/2015   Procedure: HARDWARE REMOVAL OF ANKLE AND I AND D;  Surgeon: Samson Frederic, MD;  Location: [facility] OR;  Service: Orthopedics;  Laterality: Left;  . INTRAMEDULLARY (IM) NAIL INTERTROCHANTERIC Left 12/25/2014   Procedure: INTRAMEDULLARY (IM) NAIL INTERTROCHANTRIC;  Surgeon: Toni Arthurs, MD;  Location: [facility] OR;  Service: Orthopedics;  Laterality: Left;  . TOTAL  ABDOMINAL HYSTERECTOMY    . TOTAL HIP ARTHROPLASTY WITH HARDWARE REMOVAL Left 06/06/2016   Procedure: LEFT TOTAL HIP ARTHROPLASTY WITH HARDWARE REMOVAL;  Surgeon: Samson Frederic, MD;  Location: [facility] OR;  Service: Orthopedics;  Laterality: Left;    OB History    No data available       Home Medications    Prior to Admission medications   Medication Sig Start Date End Date Taking? Authorizing Provider  amLODipine (NORVASC) 5 MG tablet Take 1 tablet (5 mg total) by mouth daily. 12/17/15  Yes Marlin Canary U, DO  aspirin 325 MG tablet Take 325 mg by mouth daily.   Yes [provider]  Calcium Carb-Cholecalciferol (CALCIUM 600 + D PO) Take 1 tablet by mouth daily.   Yes [provider]  ibuprofen (ADVIL,MOTRIN) 200 MG tablet Take 400 mg by mouth every 4 (four) hours as needed for headache or moderate pain.    Yes [provider]  metoprolol tartrate (LOPRESSOR) 25 MG tablet Take 1 tablet (25 mg total) by mouth 2 (two) times daily. 03/04/15  Yes Rhetta Mura, MD  pantoprazole (PROTONIX) 40 MG tablet Take 40 mg by mouth daily. Before breakfast   Yes [provider]  sertraline (ZOLOFT) 100 MG tablet Take 100 mg by mouth daily.   Yes [provider]  apixaban (ELIQUIS) 2.5 MG TABS tablet Take 1 tablet (2.5 mg total) by mouth every 12 (twelve) hours. Patient not taking: Reported on 10/17/2016 06/07/16   Samson Frederic, MD  docusate sodium (COLACE) 100 MG capsule Take 1 capsule (100 mg total) by mouth 2 (two) times daily. Patient not taking: Reported on 10/17/2016 06/07/16   Samson Frederic, MD  HYDROcodone-acetaminophen (NORCO/VICODIN) 5-325 MG tablet Take 1-2 tablets by mouth every 4 (four) hours as needed (breakthrough pain). Patient not taking: Reported on 07/01/2016 06/07/16   Samson Frederic, MD  ondansetron (ZOFRAN) 4 MG tablet Take 1 tablet (4 mg total) by mouth every 6 (six) hours as needed for nausea. Patient not taking: Reported on 10/17/2016 06/07/16    Samson Frederic, MD  senna (SENOKOT) 8.6 MG TABS tablet Take 2 tablets (17.2 mg total) by mouth at bedtime. Patient not taking: Reported on 10/17/2016 06/07/16   Samson Frederic, MD    Family History Family History  Problem Relation Age of Onset  . Heart attack Mother   . Colon cancer Father   . Diabetes Brother   . Heart attack Brother 38       MI    Social History Social History  Substance Use Topics  . Smoking status: Never Smoker  . Smokeless tobacco: Never Used  . Alcohol use No     Allergies   Sulfa antibiotics   Review of Systems Review of Systems The patient denies anorexia, fever, weight loss,, vision loss, decreased hearing, hoarseness, chest pain, syncope, dyspnea on exertion, peripheral edema, balance deficits, hemoptysis, abdominal pain, melena, hematochezia, severe  indigestion/heartburn, hematuria, incontinence, genital sores, muscle weakness, suspicious skin lesions, transient blindness, depression, unusual weight change, abnormal bleeding, enlarged lymph nodes, angioedema, and breast masses.   Physical Exam Updated Vital Signs BP 112/88 (BP Location: Right Arm)   Pulse 77   Temp 98.6 F (37 C) (Oral)   Resp (!) 23   SpO2 100%   Physical Exam  Constitutional: She appears well-developed and well-nourished.  HENT:  Head: Normocephalic and atraumatic.  Eyes: Pupils are equal, round, and reactive to light. Conjunctivae are normal.  Neck: Trachea normal, normal range of motion and full passive range of motion without pain. Neck supple.  Cardiovascular: Normal rate, regular rhythm and normal pulses.   Pulmonary/Chest: Effort normal and breath sounds normal. Chest wall is not dull to percussion. She exhibits no tenderness, no crepitus, no edema, no deformity and no retraction.  Abdominal: Soft. Normal appearance and bowel sounds are normal.  Musculoskeletal: Normal range of motion.  Good pulses to left foot but it is cold and her right foot is warm. Intact  sensation of touch. Decreased ROM to left hip with pain to palpation on the inguinal region. No obvious deformities.  Neurological: She is alert. She has normal strength.  Skin: Skin is warm, dry and intact.  Psychiatric: She has a normal mood and affect. Her speech is normal and behavior is normal. Judgment and thought content normal. Cognition and memory are normal.     ED Treatments / Results  Labs (all labs ordered are listed, but only abnormal results are displayed) Labs Reviewed  BASIC METABOLIC PANEL - Abnormal; Notable for the following:       Result Value   Sodium 134 (*)    Chloride 100 (*)    CO2 21 (*)    Glucose, Bld 102 (*)    All other components within normal limits  CBC WITH DIFFERENTIAL/PLATELET - Abnormal; Notable for the following:    Hemoglobin 11.8 (*)    HCT 35.4 (*)    All other components within normal limits  PROTIME-INR  TYPE AND SCREEN    EKG  EKG Interpretation  Date/Time:  Monday October 17 2016 10:19:55 EDT Ventricular Rate:  66 PR Interval:    QRS Duration: 120 QT Interval:  418 QTC Calculation: 438 R Axis:   67 Text Interpretation:  Sinus rhythm IVCD, consider atypical RBBB Artifact No significant change since last tracing Abnormal ekg Confirmed by Gerhard Munch [phone]) on 10/17/2016 12:32:32 PM       Radiology Dg Hip Unilat With Pelvis 2-3 Views Left  Result Date: 10/17/2016 CLINICAL DATA:  Left hip pain after fall. EXAM: DG HIP (WITH OR WITHOUT PELVIS) 2-3V LEFT COMPARISON:  Radiographs of July 08, 2016. FINDINGS: Status post left total hip arthroplasty. Moderately displaced medial left acetabular fracture is now noted. Right hip appears normal. Vascular calcifications are noted. IMPRESSION: Status post left total hip arthroplasty. Interval development of moderately displaced medial left acetabular fracture. Electronically Signed   By: Lupita Raider, M.D.   On: 10/17/2016 11:38    Procedures Procedures (including critical care  time)  Medications Ordered in ED Medications  fentaNYL (SUBLIMAZE) injection 50 mcg (50 mcg Intravenous Given 10/17/16 1100)     Initial Impression / Assessment and Plan / ED Course  I have reviewed the triage vital signs and the nursing notes.  Pertinent labs & imaging results that were available during my care of the patient were reviewed by me and considered in my medical decision making (see chart  for details).  Clinical Course as of Oct 18 1311  Mon Oct 17, 2016  1610 Patient seen and evaluated. She is having severe left hip pain. ED HIP/FEMUR fracture order set placed. Her Orthopedist is Dr. Linna Caprice.  [TG]  1311 Spoke with Dr. Linna Caprice who recommends CT scan of the hip. Ordered. If I feel patient requires admission it will need to be to medicine.  [TG]  1313 Consult to Medicine they have agreed to evaluate patient for possible admission.  [TG]    Clinical Course User Index [TG] Marlon Pel, PA-C     Final Clinical Impressions(s) / ED Diagnoses   Final diagnoses:  Other closed fracture of left acetabulum, initial encounter [facility])  Fall, initial encounter    New Prescriptions New Prescriptions   No medications on file     Marlon Pel, Cordelia Poche 10/17/16 1313    Gerhard Munch, MD 10/17/16 [phone]

## 2016-10-17 NOTE — ED Provider Notes (Signed)
Patient care for by PA Neva Seat who admitted the patient to the internal medicine service for pelvic fractures. Spoke with Dr. Linna Caprice, ortho surgery, regarding the patient's follow up care. Recommended weight bearing as tolerated and follow up in his office in 2 weeks. Spoke with the admitting resident to ensure this information was received.   Barkley Boards, PA-C 10/17/16 1556    Gerhard Munch, MD 10/17/16 [phone]

## 2016-10-17 NOTE — ED Notes (Signed)
Admitting at the bedside.  

## 2016-10-17 NOTE — ED Notes (Signed)
Patient transported to CT 

## 2016-10-17 NOTE — ED Notes (Signed)
Dinner Tray ordered 

## 2016-10-18 ENCOUNTER — Encounter (HOSPITAL_COMMUNITY): Payer: Self-pay

## 2016-10-18 DIAGNOSIS — K219 Gastro-esophageal reflux disease without esophagitis: Secondary | ICD-10-CM | POA: Diagnosis not present

## 2016-10-18 DIAGNOSIS — Z888 Allergy status to other drugs, medicaments and biological substances status: Secondary | ICD-10-CM

## 2016-10-18 DIAGNOSIS — Z8739 Personal history of other diseases of the musculoskeletal system and connective tissue: Secondary | ICD-10-CM | POA: Diagnosis not present

## 2016-10-18 DIAGNOSIS — I10 Essential (primary) hypertension: Secondary | ICD-10-CM | POA: Diagnosis not present

## 2016-10-18 DIAGNOSIS — E785 Hyperlipidemia, unspecified: Secondary | ICD-10-CM

## 2016-10-18 DIAGNOSIS — Z9181 History of falling: Secondary | ICD-10-CM

## 2016-10-18 DIAGNOSIS — Z79899 Other long term (current) drug therapy: Secondary | ICD-10-CM | POA: Diagnosis not present

## 2016-10-18 DIAGNOSIS — R002 Palpitations: Secondary | ICD-10-CM | POA: Diagnosis not present

## 2016-10-18 DIAGNOSIS — M80052A Age-related osteoporosis with current pathological fracture, left femur, initial encounter for fracture: Secondary | ICD-10-CM | POA: Diagnosis not present

## 2016-10-18 DIAGNOSIS — F329 Major depressive disorder, single episode, unspecified: Secondary | ICD-10-CM

## 2016-10-18 DIAGNOSIS — S32409A Unspecified fracture of unspecified acetabulum, initial encounter for closed fracture: Secondary | ICD-10-CM | POA: Diagnosis present

## 2016-10-18 DIAGNOSIS — Z96642 Presence of left artificial hip joint: Secondary | ICD-10-CM

## 2016-10-18 LAB — BASIC METABOLIC PANEL
ANION GAP: 8 (ref 5–15)
BUN: 17 mg/dL (ref 6–20)
CHLORIDE: 105 mmol/L (ref 101–111)
CO2: 22 mmol/L (ref 22–32)
Calcium: 8.6 mg/dL — ABNORMAL LOW (ref 8.9–10.3)
Creatinine, Ser: 0.87 mg/dL (ref 0.44–1.00)
GFR calc non Af Amer: >60 mL/min (ref >60)
GLUCOSE: 101 mg/dL — AB (ref 65–99)
Potassium: 3.4 mmol/L — ABNORMAL LOW (ref 3.5–5.1)
Sodium: 135 mmol/L (ref 135–145)

## 2016-10-18 LAB — CBC
HEMATOCRIT: 32.7 % — AB (ref 36.0–46.0)
Hemoglobin: 10.7 g/dL — ABNORMAL LOW (ref 12.0–15.0)
MCH: 29.8 pg (ref 26.0–34.0)
MCHC: 32.7 g/dL (ref 30.0–36.0)
MCV: 91.1 fL (ref 78.0–100.0)
Platelets: 268 10*3/uL (ref 150–400)
RBC: 3.59 MIL/uL — AB (ref 3.87–5.11)
RDW: 13.7 % (ref 11.5–15.5)
WBC: 6.1 10*3/uL (ref 4.0–10.5)

## 2016-10-18 MED ORDER — POTASSIUM CHLORIDE CRYS ER 20 MEQ PO TBCR
40.0000 meq | EXTENDED_RELEASE_TABLET | Freq: Once | ORAL | Status: AC
  Administered 2016-10-18: 40 meq via ORAL
  Filled 2016-10-18: qty 2

## 2016-10-18 MED ORDER — HYDROCODONE-ACETAMINOPHEN 5-325 MG PO TABS
2.0000 | ORAL_TABLET | Freq: Four times a day (QID) | ORAL | Status: DC | PRN
  Administered 2016-10-18 – 2016-10-19 (×4): 2 via ORAL
  Filled 2016-10-18 (×4): qty 2

## 2016-10-18 MED ORDER — HYDROMORPHONE HCL 1 MG/ML IJ SOLN
0.1000 mg | INTRAMUSCULAR | Status: DC | PRN
  Administered 2016-10-18: 0.1 mg via INTRAVENOUS
  Filled 2016-10-18: qty 1

## 2016-10-18 MED ORDER — ACETAMINOPHEN 325 MG PO TABS
650.0000 mg | ORAL_TABLET | Freq: Three times a day (TID) | ORAL | Status: DC
  Administered 2016-10-18 – 2016-10-19 (×3): 650 mg via ORAL
  Filled 2016-10-18 (×3): qty 2

## 2016-10-18 MED ORDER — POTASSIUM CHLORIDE CRYS ER 20 MEQ PO TBCR
20.0000 meq | EXTENDED_RELEASE_TABLET | Freq: Once | ORAL | Status: AC
  Administered 2016-10-18: 20 meq via ORAL
  Filled 2016-10-18: qty 1

## 2016-10-18 NOTE — NC FL2 (Signed)
[place] MEDICAID FL2 LEVEL OF CARE SCREENING TOOL     IDENTIFICATION  Patient Name: [name] Birthdate: 02-Sep-1945 Sex: female Admission Date (Current Location): 10/17/2016  [facility] and [place] Number:  Producer, television/film/video and Address:  [facility] [place], [address] [place], [place] [address]      Provider Number: [id number]  Attending Physician Name and Address:  Burns Spain, MD  Relative Name and Phone Number:  [name], [phone]    Current Level of Care: Hospital Recommended Level of Care: Skilled Nursing Facility Prior Approval Number:    Date Approved/Denied: 10/18/16 PASRR Number: [id number]  Discharge Plan: SNF    Current Diagnoses: Patient Active Problem List   Diagnosis Date Noted  . Left hip pain 10/17/2016  . Avascular necrosis of hip, left (HCC) 06/06/2016  . Painful orthopaedic hardware (HCC) 06/06/2016  . Hyperkalemia 12/15/2015  . GERD (gastroesophageal reflux disease) 12/15/2015  . Staphylococcus aureus bacteremia   . Postoperative infection   . Acute blood loss anemia 03/01/2015  . Hip pain, acute   . Postop check   . Wound cellulitis   . Wound dehiscence 02/27/2015  . Tachycardia 02/27/2015  . Deep incisional surgical site infection 02/27/2015  . History of diastolic dysfunction 12/25/2014  . Closed left hip fracture (HCC) 12/25/2014  . History of sinus tachycardia 12/25/2014  . Anxiety 12/25/2014  . PNA (pneumonia) 12/25/2014  . Fracture, intertrochanteric, left femur (HCC) 12/25/2014  . Abnormal chest x-ray   . Sinus tachycardia 12/16/2014  . Right ventricular hypertrophy 12/16/2014  . Left Trimalleolar fracture with displacement of the ankle joint S/P ORIF 11/26/2014  . Essential hypertension 11/26/2014  . Encounter for central line care 11/26/2014  . HCAP (healthcare-associated pneumonia) 11/26/2014  . Leukocytosis 11/26/2014  . Chronic depression 11/26/2014  . Esophageal  reflux 11/26/2014  . Protein-calorie malnutrition (HCC) 11/26/2014  . Chest pain 08/07/2014  . Anemia 08/07/2014  . AKI (acute kidney injury) (HCC) 08/07/2014  . Hyperglycemia 08/07/2014  . Hyponatremia 08/07/2014  . Hypoxia 05/18/2014  . Angina decubitus (HCC) 07/20/2011  . Palpitations 07/20/2011  . Hypercholesterolemia 07/20/2011  . Family history of coronary artery disease 07/20/2011    Orientation RESPIRATION BLADDER Height & Weight     Self, Time, Situation, Place  Normal Continent Weight: 134 lb 4.2 oz (60.9 kg) Height:   (157.5 cm)  BEHAVIORAL SYMPTOMS/MOOD NEUROLOGICAL BOWEL NUTRITION STATUS      Continent Diet (See DC Summary)  AMBULATORY STATUS COMMUNICATION OF NEEDS Skin   Limited Assist Verbally Bruising (L actebular fracture)                       Personal Care Assistance Level of Assistance  Bathing, Dressing, Feeding Bathing Assistance: Maximum assistance Feeding assistance: Limited assistance Dressing Assistance: Limited assistance     Functional Limitations Info             SPECIAL CARE FACTORS FREQUENCY  PT (By licensed PT), OT (By licensed OT)     PT Frequency: 3x week OT Frequency: 2x week            Contractures      Additional Factors Info  Code Status, Allergies Code Status Info: Full code Allergies Info: SULFA ANTIBIOTICS            Current Medications (10/18/2016):  This is the current hospital active medication list Current Facility-Administered Medications  Medication Dose Route Frequency Provider Last Rate Last Dose  .  acetaminophen (TYLENOL) tablet 650 mg  650 mg Oral Q6H PRN Deneise Lever, MD       Or  . acetaminophen (TYLENOL) suppository 650 mg  650 mg Rectal Q6H PRN Deneise Lever, MD      . amLODipine (NORVASC) tablet 5 mg  5 mg Oral Daily Deneise Lever, MD   5 mg at 10/18/16 0954  . aspirin tablet 325 mg  325 mg Oral Daily Deneise Lever, MD   325 mg at 10/18/16 0954  . docusate sodium (COLACE) capsule  100 mg  100 mg Oral BID Deneise Lever, MD   100 mg at 10/18/16 0954  . enoxaparin (LOVENOX) injection 40 mg  40 mg Subcutaneous Q24H Deneise Lever, MD   40 mg at 10/17/16 1905  . HYDROcodone-acetaminophen (NORCO/VICODIN) 5-325 MG per tablet 2 tablet  2 tablet Oral Q6H PRN Levora Dredge, MD   2 tablet at 10/18/16 1449  . HYDROmorphone (DILAUDID) injection 0.1 mg  0.1 mg Intravenous Q4H PRN Samson Frederic, MD   0.1 mg at 10/18/16 1223  . ibuprofen (ADVIL,MOTRIN) tablet 400 mg  400 mg Oral Q6H PRN Deneise Lever, MD   400 mg at 10/18/16 0954  . metoprolol tartrate (LOPRESSOR) tablet 25 mg  25 mg Oral BID Deneise Lever, MD   25 mg at 10/18/16 0954  . ondansetron (ZOFRAN) tablet 4 mg  4 mg Oral Q6H PRN Deneise Lever, MD       Or  . ondansetron (ZOFRAN) injection 4 mg  4 mg Intravenous Q6H PRN Deneise Lever, MD      . pantoprazole (PROTONIX) EC tablet 40 mg  40 mg Oral Daily Deneise Lever, MD   40 mg at 10/18/16 0954  . senna (SENOKOT) tablet 8.6 mg  1 tablet Oral BID Deneise Lever, MD   8.6 mg at 10/18/16 0954  . sertraline (ZOLOFT) tablet 100 mg  100 mg Oral Daily Deneise Lever, MD   100 mg at 10/18/16 9604     Discharge Medications: Please see discharge summary for a list of discharge medications.  Relevant Imaging Results:  Relevant Lab Results:   Additional Information SS#: [id number]  Tresa Moore, LCSW

## 2016-10-18 NOTE — Progress Notes (Signed)
  Date: 10/18/2016  Patient name: [name]  Medical record number: [id number]  Date of birth: [birth year]   I have seen and evaluated [name] and discussed their care with the Residency Team. [name] a 71 year old woman with a history of a left total hip replacement in May 2018 secondary to left AVN. She presented with a chief complaint of left hip pain since September 12. She then fell on September 14 from standing height when she tried to sit in a chair and slipped, landing on her left side. In the ED, she was found to have a left displaced medial acetabular fracture. Orthopedics has recommended weightbearing as tolerated and outpatient follow-up. This morning, she continues complaining of pain without relief from her oral hydrocodone 5 mg. PT has recommended SNF as her ambulation is limited significantly by pain.  PMHx, Fam Hx, and/or Soc Hx : Son lives with her in one story home without any steps. Has shower chair. Never had a DEXA or been on a bisphosphonate.   T max 98.7 HR 68 BP 132/54 98% O2 sat  She is sitting in recliner in NAD. She was unable to sit up as it caused L hip pain. She was spontaneously moving both LE.   HgB 10.7 stable Cr 0.87  I personally viewed the CT L hip images and confirmed my reading with the official read. S/p L THR, displaced medial L acetabular fx  I personally viewed the EKG and confirmed my reading with the official read. Sinus, nl axis, RBBB, unchanged from prior  Assessment and Plan: I have seen and evaluated the patient as outlined above. I agree with the formulated Assessment and Plan as detailed in the residents' note, with the following changes: Madison. [name] is a 71 year old woman in he is status post left total hip replacement and is now presenting with left hip pain from a left displaced medial acetabular fracture due to a fragility fracture. She has no knowledge of any outpatient DEXA scan and does not endorse taking a bisphosphonate  but is taking calcium. Due to limited ambulation, she will need to be discharged to a SNF if she agrees.  1. L displaced medial acetabular fracture - work on pain control for better PT participation.Sch APAP as might provide a baseline of pain control and decrease need for opiates. Discuss SNF.   2. Osteoporosis - her fragility fracture is diagnostic of osteoporosis. We will recommend a bisphosphonate on discharge. She and her PCP can decide if they want a baseline DEXA scan. We will continue her calcium. Vitamin D is extremely controversial and I will leave that up to the patient and her PCP.  Burns Spain, MD 9/18/201811:25 AM

## 2016-10-18 NOTE — Consult Note (Signed)
ORTHOPAEDIC CONSULTATION  REQUESTING PHYSICIAN: Burns Spain, MD  PCP:  [facility]  Chief Complaint: Left hip pain  HPI: [name] is a 71 y.o. female who complains of  left hip pain after a ground-level fall at home on 10/14/2016. She had progressive pain with weightbearing. She came to the emergency department yesterday. X-rays and a CT scan revealed a left LC 1 pelvis fracture. No periprosthetic fractures were seen. She was admitted to the hospitalist service for pain control and physical therapy.  Past Medical History:  Diagnosis Date  . Anemia   . Anxiety   . Arthritis   . Azotemia   . Chest pain   . Chicken pox   . Depressive disorder   . Dizziness   . Fall 06/15/2011   Caused discomfort in lower left rib and left lateral hip area  . GERD (gastroesophageal reflux disease)   . H/O: hysterectomy 1985  . History of MRSA infection   . Hypertension   . Hypokalemia   . Left trimalleolar fracture   . Leukocytosis   . OCD (obsessive compulsive disorder)   . Opioid abuse   . Pre-syncope   . Respiratory failure (HCC)   . Severe sepsis (HCC)   . SOB (shortness of breath) on exertion    Past Surgical History:  Procedure Laterality Date  . APPENDECTOMY    . APPLICATION OF WOUND VAC Left 02/27/2015   Procedure: APPLICATION OF WOUND VAC;  Surgeon: Samson Frederic, MD;  Location: [facility] OR;  Service: Orthopedics;  Laterality: Left;  . GALLBLADDER SURGERY    . HARDWARE REMOVAL Left 02/27/2015   Procedure: HARDWARE REMOVAL OF ANKLE AND I AND D;  Surgeon: Samson Frederic, MD;  Location: [facility] OR;  Service: Orthopedics;  Laterality: Left;  . INTRAMEDULLARY (IM) NAIL INTERTROCHANTERIC Left 12/25/2014   Procedure: INTRAMEDULLARY (IM) NAIL INTERTROCHANTRIC;  Surgeon: Toni Arthurs, MD;  Location: [facility] OR;  Service: Orthopedics;  Laterality: Left;  . TOTAL ABDOMINAL HYSTERECTOMY    . TOTAL HIP ARTHROPLASTY WITH HARDWARE REMOVAL Left 06/06/2016   Procedure:  LEFT TOTAL HIP ARTHROPLASTY WITH HARDWARE REMOVAL;  Surgeon: Samson Frederic, MD;  Location: [facility] OR;  Service: Orthopedics;  Laterality: Left;   Social History   Social History  . Marital status: Widowed    Spouse name: N/A  . Number of children: 3  . Years of education: N/A   Occupational History  . retail-retired    Social History Main Topics  . Smoking status: Never Smoker  . Smokeless tobacco: Never Used  . Alcohol use No  . Drug use: No  . Sexual activity: Not on file   Other Topics Concern  . Not on file   Social History Narrative  . No narrative on file   Family History  Problem Relation Age of Onset  . Heart attack Mother   . Colon cancer Father   . Diabetes Brother   . Heart attack Brother 8       MI   Allergies  Allergen Reactions  . Sulfa Antibiotics Hives   Prior to Admission medications   Medication Sig Start Date End Date Taking? Authorizing Provider  amLODipine (NORVASC) 5 MG tablet Take 1 tablet (5 mg total) by mouth daily. 12/17/15  Yes Marlin Canary U, DO  aspirin 325 MG tablet Take 325 mg by mouth daily.   Yes [provider]  Calcium Carb-Cholecalciferol (CALCIUM 600 + D PO) Take 1 tablet by mouth daily.   Yes  [provider]  ibuprofen (ADVIL,MOTRIN) 200 MG tablet Take 400 mg by mouth every 4 (four) hours as needed for headache or moderate pain.    Yes [provider]  metoprolol tartrate (LOPRESSOR) 25 MG tablet Take 1 tablet (25 mg total) by mouth 2 (two) times daily. 03/04/15  Yes Rhetta Mura, MD  pantoprazole (PROTONIX) 40 MG tablet Take 40 mg by mouth daily. Before breakfast   Yes [provider]  sertraline (ZOLOFT) 100 MG tablet Take 100 mg by mouth daily.   Yes [provider]  docusate sodium (COLACE) 100 MG capsule Take 1 capsule (100 mg total) by mouth 2 (two) times daily. Patient not taking: Reported on 10/17/2016 06/07/16   Samson Frederic, MD  HYDROcodone-acetaminophen (NORCO/VICODIN)  5-325 MG tablet Take 1-2 tablets by mouth every 4 (four) hours as needed (breakthrough pain). Patient not taking: Reported on 07/01/2016 06/07/16   Samson Frederic, MD  ondansetron (ZOFRAN) 4 MG tablet Take 1 tablet (4 mg total) by mouth every 6 (six) hours as needed for nausea. Patient not taking: Reported on 10/17/2016 06/07/16   Samson Frederic, MD  senna (SENOKOT) 8.6 MG TABS tablet Take 2 tablets (17.2 mg total) by mouth at bedtime. Patient not taking: Reported on 10/17/2016 06/07/16   Samson Frederic, MD   Ct Hip Left Wo Contrast  Result Date: 10/17/2016 CLINICAL DATA:  Left hip pain after fall. EXAM: CT OF THE LEFT HIP WITHOUT CONTRAST TECHNIQUE: Multidetector CT imaging of the left hip was performed according to the standard protocol. Multiplanar CT image reconstructions were also generated. COMPARISON:  Left hip x-rays from same date. FINDINGS: Bones/Joint/Cartilage Acute minimally displaced fracture of the left puboacetabular junction. Actue nondisplaced fracture of the left inferior pubic ramus with slight apex lateral angulation. Postsurgical changes related to left total hip arthroplasty without evidence of hardware failure or loosening. Chronic healed intertrochanteric fracture. Diffuse osteopenia. Ligaments Suboptimally assessed by CT. Muscles and Tendons No focal abnormality. Soft tissues Small left pelvic sidewall hematoma. There are a few small foci of air within the left pelvic sidewall, possibly intravascular. Prior hysterectomy. No lymphadenopathy. Atherosclerotic vascular calcifications. IMPRESSION: 1. Acute, minimally displaced fracture of the left puboacetabular junction. Small left pelvic sidewall hematoma. 2. Acute nondisplaced fracture of the left inferior pubic ramus. 3. Postsurgical changes related to left total hip arthroplasty without evidence of hardware failure or loosening. Electronically Signed   By: Obie Dredge M.D.   On: 10/17/2016 15:12   Dg Hip Unilat With Pelvis 2-3 Views  Left  Result Date: 10/17/2016 CLINICAL DATA:  Left hip pain after fall. EXAM: DG HIP (WITH OR WITHOUT PELVIS) 2-3V LEFT COMPARISON:  Radiographs of July 08, 2016. FINDINGS: Status post left total hip arthroplasty. Moderately displaced medial left acetabular fracture is now noted. Right hip appears normal. Vascular calcifications are noted. IMPRESSION: Status post left total hip arthroplasty. Interval development of moderately displaced medial left acetabular fracture. Electronically Signed   By: Lupita Raider, M.D.   On: 10/17/2016 11:38    Positive ROS: All other systems have been reviewed and were otherwise negative with the exception of those mentioned in the HPI and as above.  Physical Exam: General: Alert, no acute distress Cardiovascular: No pedal edema Respiratory: No cyanosis, no use of accessory musculature GI: No organomegaly, abdomen is soft and non-tender Skin: No lesions in the area of chief complaint Neurologic: Sensation intact distally Psychiatric: Patient is competent for consent with normal mood and affect Lymphatic: No axillary or cervical lymphadenopathy  MUSCULOSKELETAL: Examination of the left lower extremity reveals well-healed incision about the hip. No rotational deformity. No pain with logrolling of the hip. She does have pain with lateral compression of the pelvis. She is neurovascularly intact.  Assessment: 1.  Status post left total hip arthroplasty. 2.  Left LC 1 pelvis fracture  Plan: I discussed the findings with the patient. She may weight-bear as tolerated with a walker. I would recommend oral calcium and vitamin D supplementation. Avoid bisphosphonates. I will see her in the office 2 weeks after discharge for repeat radiographs. The patient understands and is amenable to the treatment plan. Call with further questions.    Linna Caprice Cloyde Reams, MD Cell [phone]    10/18/2016 12:33 PM

## 2016-10-18 NOTE — Plan of Care (Signed)
Problem: Safety: Goal: Ability to remain free from injury will improve Outcome: Progressing Bed alarm on. Call bell within reach. Bedrest Total x1 No fall this shift

## 2016-10-18 NOTE — Evaluation (Addendum)
Physical Therapy Evaluation Patient Details Name: [name] MRN: [id number] DOB: [birth year] Today's Date: 10/18/2016   History of Present Illness  [name] is a 71 yo female with history of left hip hemiarthroplasty converted to left total hip replacement 05/2016 secondary to left avascular necrosis, HTN, HLD, depression, GERD who presents to the ED with left hip pain after a mechanical fall and was found to have a L displaced medial left acetabular fracture.  Clinical Impression  Pt with L actebular fracture. Per MD pt WBAT. Pt however is greatly limited by L hip and groin pain. RN is aware. Pt unsafe to return home at this time without 24/7 assist. Anticipate once pain is under control the patient with progress quickly.   Follow Up Recommendations SNF;Supervision/Assistance - 24 hour    Equipment Recommendations  None recommended by PT (has walker and tub bench)    Recommendations for Other Services       Precautions / Restrictions Precautions Precautions: Fall Precaution Comments: L hip/groin pain Restrictions Weight Bearing Restrictions: Yes LLE Weight Bearing: Weight bearing as tolerated      Mobility  Bed Mobility Overal bed mobility: Needs Assistance Bed Mobility: Supine to Sit     Supine to sit: Max assist;+2 for physical assistance     General bed mobility comments: due to L hip pain pt required maxAx2 to complete helicopter transfer to EOB. assist for LEs and trunk elevation  Transfers Overall transfer level: Needs assistance Equipment used: Rolling walker (2 wheeled) Transfers: Sit to/from [employer] Sit to Stand: Min assist;+2 physical assistance Stand pivot transfers: Min assist;Mod assist;+2 physical assistance       General transfer comment: v/c's for hand placement, slow, v/c's for sequencing and minA for walker management, unable to tolerate L LE WBing, appeared to be TTWB  Ambulation/Gait             General Gait  Details: unable due to pain  Stairs            Wheelchair Mobility    Modified Rankin (Stroke Patients Only)       Balance Overall balance assessment: Needs assistance Sitting-balance support: Bilateral upper extremity supported Sitting balance-Leahy Scale: Fair     Standing balance support: Bilateral upper extremity supported Standing balance-Leahy Scale: Poor Standing balance comment: dependent on RW                             Pertinent Vitals/Pain Pain Assessment: 0-10 Pain Score: 7  Pain Location: L hip/groin Pain Intervention(s): Limited activity within patient's tolerance;Monitored during session    Home Living Family/patient expects to be discharged to:: Private residence Living Arrangements: Children Available Help at Discharge: Family;Available 24 hours/day Type of Home: House Home Access: Stairs to enter Entrance Stairs-Rails: Can reach both;Right;Left Entrance Stairs-Number of Steps: 2 Home Layout: Two level;Able to live on main level with bedroom/bathroom Home Equipment: Dan Humphreys - 2 wheels;Tub bench Additional Comments: pt states her son works but her grandson will come when son is working    Prior Function Level of Independence: Independent         Comments: wasn't using an AD     Hand Dominance   Dominant Hand: Right    Extremity/Trunk Assessment   Upper Extremity Assessment Upper Extremity Assessment: Overall WFL for tasks assessed    Lower Extremity Assessment Lower Extremity Assessment: LLE deficits/detail LLE Deficits / Details: attempted quad set however unable to complete multiple  due to pain. no initiation of L ROM due to pain    Cervical / Trunk Assessment Cervical / Trunk Assessment: Normal  Communication   Communication: No difficulties  Cognition Arousal/Alertness: Awake/alert Behavior During Therapy: Anxious (regarding the pain and plan to manage it) Overall Cognitive Status: Difficult to assess                                  General Comments: pt able to follow commands but very distracted by pain      General Comments      Exercises     Assessment/Plan    PT Assessment Patient needs continued PT services  PT Problem List Decreased strength;Decreased range of motion;Decreased activity tolerance;Decreased balance;Decreased mobility;Decreased coordination;Decreased cognition;Decreased knowledge of use of DME;Decreased safety awareness;Decreased knowledge of precautions;Pain       PT Treatment Interventions DME instruction;Gait training;Stair training;Functional mobility training;Therapeutic activities;Therapeutic exercise;Balance training;Cognitive remediation;Patient/family education;Wheelchair mobility training    PT Goals (Current goals can be found in the Care Plan section)  Acute Rehab PT Goals Patient Stated Goal: stop the pain PT Goal Formulation: With patient Time For Goal Achievement: 10/25/16 Potential to Achieve Goals: Good    Frequency Min 3X/week   Barriers to discharge Decreased caregiver support unsure if pt has 24/7 assist, reports her son works and "sometime" my grandson comes over    Co-evaluation               AM-PAC PT "6 Clicks" Daily Activity  Outcome Measure Difficulty turning over in bed (including adjusting bedclothes, sheets and blankets)?: Unable Difficulty moving from lying on back to sitting on the side of the bed? : Unable Difficulty sitting down on and standing up from a chair with arms (e.g., wheelchair, bedside commode, etc,.)?: A Little Help needed moving to and from a bed to chair (including a wheelchair)?: A Little Help needed walking in hospital room?: A Little Help needed climbing 3-5 steps with a railing? : Total 6 Click Score: 12    End of Session Equipment Utilized During Treatment: Gait belt Activity Tolerance: Patient limited by pain Patient left: in chair;with call bell/phone within reach;with chair alarm  set Nurse Communication: Mobility status;Patient requests pain meds PT Visit Diagnosis: Unsteadiness on feet (R26.81);History of falling (Z91.81);Pain Pain - Right/Left: Left Pain - part of body: Hip    Time: 0812-0831 PT Time Calculation (min) (ACUTE ONLY): 19 min   Charges:   PT Evaluation $PT Eval Moderate Complexity: 1 Mod     PT G Codes:   PT G-Codes **NOT FOR INPATIENT CLASS** Functional Assessment Tool Used: Clinical judgement Functional Limitation: Mobility: Walking and moving around Mobility: Walking and Moving Around Current Status (R6045): At least 60 percent but less than 80 percent impaired, limited or restricted Mobility: Walking and Moving Around Goal Status [phone]): At least 20 percent but less than 40 percent impaired, limited or restricted    Lewis Shock, PT, DPT Pager #: [phone] Office #: [phone]   Owais Pruett M Joesiah Lonon 10/18/2016, 9:00 AM

## 2016-10-18 NOTE — Progress Notes (Addendum)
   Subjective:  No acute events overnight. Patient continues to complain of left hip pain that is moderately control with current pain regimen. Denied any other complaints this morning.  Objective:  Vital signs in last 24 hours: Vitals:   10/17/16 2044 10/18/16 0100 10/18/16 0132 10/18/16 0629  BP: (!) 131/46  (!) 159/46 (!) 132/54  Pulse:   64 68  Resp: 15     Temp: 98.2 F (36.8 C)   98.7 F (37.1 C)  TempSrc: Oral   Oral  SpO2: 99%   98%  Weight:  134 lb 4.2 oz (60.9 kg)    Height:   (1.575 m)     General: pleasant female, well-nourished, well-developed, sitting up in chair in no acute distress Cardiac: regular rate and rhythm, nl S1/S2, no murmurs, rubs or gallops  Pulm: unable to assess today as patient having difficulty sitting up and leaning forward secondary to pain Abd: soft, NTND, bowel sounds present  Neuro: A&Ox3, able to move all 4 extremities Ext: warm and well perfused, no peripheral edema, 2+ DP pulses on R and 1+ on L     Assessment/Plan:  [name] is a 71 yo female with history of 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   # L acetabular hip fracture: Patient presenting with L hip pain after mechanical fall with L acetabular fracture seen on XR and CT. She is neurovascularly intact. Ortho consulted and recs as below.  - Weight bearing as tolerated and outpatient follow-up in 2 weeks per Dr. Linna Caprice recommendations - Continue home calcium and vitamin D - PT recommended SNF - OT eval pending - Pain control: Norco 5-325 mg q6h PRN for pain, increased to 2 tablets today + scheduled Tylenol 650 mg TID + IV Dilaudid 0.1 mg q4h PRN (per ortho) - Bowel regimen: senokot BID  - SW consult for SNF placement - Recommend starting bisphosphonate on  discharge given fragility fracture - Consider DEXA scan as an outpatient  # Palpitations: TTE nl in 2017. Well controlled on metoprolol. - Continue home metoprolol 25 mg BID   # HTN - Continue home amlodipine 5 mg QD  # GERD - Continue home Protonix 40 mg QD   # Depression - Continue home Zoloft 100 mg QD   F: None  E: will monitor and replete as needed N: HH diet   VTE ppx: SQ Lovenox   Code status: Full code, confirmed on admission    Dispo: Anticipated discharge in approximately 2-3 day(s).   Burna Cash, MD  Internal Medicine PGY-1  P [phone]

## 2016-10-18 NOTE — Evaluation (Addendum)
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   Clinical Impression   PTA, pt was living with her son who assisted with LB ADLs and IADLs; son assisting with LB ADLs to adhere to posterior hip precautions from L THA in 05/2016. Pt currently requiring Min Guard A for functional mobility and ADLs in standing. Pt would benefit form further acute OT to facilitate safe dc. Recommend dc home with HHOT to optimize safety and independence with ADLs and functional mobility.     Follow Up Recommendations  Home health OT;Supervision/Assistance - 24 hour    Equipment Recommendations  None recommended by OT    Recommendations for Other Services PT consult     Precautions / Restrictions Precautions Precautions: Fall Precaution Comments: Posterior hip precautions from THA in May. Reviewed precautions with pt Restrictions Weight Bearing Restrictions: Yes LLE Weight Bearing: Weight bearing as tolerated      Mobility Bed Mobility               General bed mobility comments: Pt in recliner upon arrival  Transfers Overall transfer level: Needs assistance Equipment used: Rolling walker (2 wheeled) Transfers: Sit to/from Stand Sit to Stand: Min guard         General transfer comment: Min Guard A for safety    Balance Overall balance assessment: Needs assistance Sitting-balance support: No upper extremity supported;Feet supported Sitting balance-Leahy Scale: Fair     Standing balance support: No upper extremity supported;During functional activity Standing  balance-Leahy Scale: Fair Standing balance comment: Able to maintain standing at sink to wash hands                           ADL either performed or assessed with clinical judgement   ADL Overall ADL's : Needs assistance/impaired Eating/Feeding: Set up;Sitting   Grooming: Wash/dry hands;Min guard;Standing;Cueing for safety Grooming Details (indicate cue type and reason): Pt requiring cues for RW management Upper Body Bathing: Set up;Sitting   Lower Body Bathing: Maximal assistance;Sit to/from stand Lower Body Bathing Details (indicate cue type and reason): Pt continuing to adhere to posterior hip precautions Upper Body Dressing : Set up;Sitting   Lower Body Dressing: Sit to/from stand;Maximal assistance Lower Body Dressing Details (indicate cue type and reason): Pt continuing to adhere to posterior hip precautions Toilet Transfer: Min guard;Cueing for safety;Ambulation;BSC Toilet Transfer Details (indicate cue type and reason): Cues for safe transfer techniques. Pt with tendency to sit prematurely Toileting- Architect and Hygiene: Min guard;Sit to/from stand Toileting - Architect Details (indicate cue type and reason): Min Guard for safety     Functional mobility during ADLs: Min guard;Rolling walker;Cueing for safety;Cueing for sequencing General ADL Comments: Pt requiring Min Guard A for funcitonal mobility and standing ADLs for safety. Pt stating that she is still using the hip precautions because "the Doctor told her to not bend foward." Pt requiring cues for RW managament, fall prevention, and safety.     Vision Baseline Vision/History: Wears glasses Wears Glasses: Reading only Patient Visual Report: No change from baseline  Perception     Praxis      Pertinent Vitals/Pain Pain Assessment: 0-10 Pain Score: 8  Pain Location: L hip/groin Pain Descriptors / Indicators: Constant;Discomfort;Grimacing;Guarding Pain Intervention(s):  Monitored during session;Limited activity within patient's tolerance;Repositioned;Patient requesting pain meds-RN notified     Hand Dominance Right   Extremity/Trunk Assessment Upper Extremity Assessment Upper Extremity Assessment: Overall WFL for tasks assessed   Lower Extremity Assessment Lower Extremity Assessment: Defer to PT evaluation   Cervical / Trunk Assessment Cervical / Trunk Assessment: Normal   Communication Communication Communication: No difficulties   Cognition Arousal/Alertness: Awake/alert Behavior During Therapy: WFL for tasks assessed/performed Overall Cognitive Status: Within Functional Limits for tasks assessed                                     General Comments  Pt daughter present for session    Exercises     Shoulder Instructions      Home Living Family/patient expects to be discharged to:: Private residence Living Arrangements: Children (Son) Available Help at Discharge: Family;Available 24 hours/day Type of Home: Mobile home Home Access: Stairs to enter Entrance Stairs-Number of Steps: 2 Entrance Stairs-Rails: Can reach both;Right;Left Home Layout: One level Alternate Level Stairs-Number of Steps: flight   Bathroom Shower/Tub: Chief Strategy Officer: Standard Bathroom Accessibility: Yes   Home Equipment: Environmental consultant - 2 wheels;Tub bench;Bedside commode;Cane - single point   Additional Comments: pt states her son works but her grandson will come when son is working      Prior Functioning/Environment Level of Independence: Needs assistance    ADL's / Homemaking Assistance Needed: Pt son assisting with LB ADLs due to post hip precautions            OT Problem List: Decreased strength;Decreased range of motion;Decreased activity tolerance;Impaired balance (sitting and/or standing);Decreased safety awareness;Decreased knowledge of use of DME or AE;Decreased knowledge of precautions;Pain      OT  Treatment/Interventions: Self-care/ADL training;Therapeutic exercise;Energy conservation;DME and/or AE instruction;Therapeutic activities;Patient/family education    OT Goals(Current goals can be found in the care plan section) Acute Rehab OT Goals Patient Stated Goal: stop the pain OT Goal Formulation: With patient Time For Goal Achievement: 11/01/16 Potential to Achieve Goals: Good ADL Goals Pt Will Perform Grooming: with modified independence;standing Pt Will Perform Upper Body Dressing: with modified independence;sitting Pt Will Perform Lower Body Dressing: with set-up;with supervision;sit to/from stand;with adaptive equipment Pt Will Transfer to Toilet: with set-up;with supervision;ambulating;bedside commode  OT Frequency: Min 2X/week   Barriers to D/C:            Co-evaluation              AM-PAC PT "6 Clicks" Daily Activity     Outcome Measure Help from another person eating meals?: None Help from another person taking care of personal grooming?: A Little Help from another person toileting, which includes using toliet, bedpan, or urinal?: A Little Help from another person bathing (including washing, rinsing, drying)?: A Lot Help from another person to put on and taking off regular upper body clothing?: None Help from another person to put on and taking off regular lower body clothing?: A Lot 6 Click Score: 18   End of Session Equipment Utilized During Treatment: Gait belt;Rolling walker Nurse Communication: Mobility status;Patient requests pain meds  Activity Tolerance: Patient tolerated treatment well;Patient limited by pain Patient left: in chair;with call bell/phone within reach;with family/visitor present  OT Visit Diagnosis: Unsteadiness on feet (R26.81);Other abnormalities of gait and mobility (R26.89);History of falling (Z91.81);Pain;Muscle weakness (generalized) (M62.81) Pain - Right/Left: Left Pain - part of body: Hip                Time: 1430-1447 OT  Time Calculation (min): 17 min Charges:  OT General Charges $OT Visit: 1 Visit OT Evaluation $OT Eval Moderate Complexity: 1 Mod G-Codes: OT G-codes **NOT FOR INPATIENT CLASS** Functional Assessment Tool Used: Clinical judgement Functional Limitation: Self care Self Care Current Status (Z6109): At least 20 percent but less than 40 percent impaired, limited or restricted Self Care Goal Status (U0454): At least 1 percent but less than 20 percent impaired, limited or restricted   [employer] MSOT, OTR/L Acute Rehab Pager: [phone] Office: [phone]  Theodoro Grist [name] 10/18/2016, 3:07 PM

## 2016-10-18 NOTE — Clinical Social Work Note (Signed)
Clinical Social Work Assessment  Patient Details  Name: [name] MRN: [id number] Date of Birth: 01-25-1946  Date of referral:  10/18/16               Reason for consult:  Facility Placement                Permission sought to share information with:  Facility Art therapist granted to share information::  Yes, Verbal Permission Granted  Name::     daughter, [name]  Agency::  [facility], [facility]  Relationship::     Contact Information:     Housing/Transportation Living arrangements for the past 2 months:  Single Family Home Source of Information:  Adult Children, Patient Patient Interpreter Needed:  None Criminal Activity/Legal Involvement Pertinent to Current Situation/Hospitalization:  No - Comment as needed Significant Relationships:  Adult Children Lives with:  Self, Adult Children Do you feel safe going back to the place where you live?  Yes Need for family participation in patient care:  Yes (Comment)  Care giving concerns:  CSW met with patient at bedside and she indicated that she resides at home with her son, who assists her as needed. She was ambulating without support until the day before yesterday when she fell. She desires to go home and would give CSW permission to work on SNF placement if warranted.  Social Worker assessment / plan:  CSW met with daughter and patient at bedside to discuss clinical team's recommendation for SNF.  CSW explained her role and and SNF process.  Pt desires to go home and hoping to do so. CSW validated and discussed the SNF process as a back up plan. CSW discussed needing insurance authorization and the process. CSW obtained permission to begin the SNF process. Pt has experience with SNf and has been to SNF in the past.  CSW completed FL2, confirmed passr, sent out offers to [facility] and [facility]. CSW will f/u as clinicals will need to be sent to Insurance when bed offer has been made.  Employment status:   Retired Nurse, adult PT Recommendations:  [place] / Referral to community resources:  [place]  Patient/Family's Response to care:  Patient appreciative of CSW f/u however, desires to go home. No issues or concerns identified.  Patient/Family's Understanding of and Emotional Response to Diagnosis, Current Treatment, and Prognosis:  Patient has good understanding of diagnosis, current treatment and prognosis. Pt hopeful to forgo SNF and return home. CSW validates and will continue to provide support. No issues identified at this time.  Emotional Assessment Appearance:  Appears stated age Attitude/Demeanor/Rapport:   (Cooperative) Affect (typically observed):  Accepting, Appropriate Orientation:  Oriented to Self, Oriented to Place, Oriented to  Time, Oriented to Situation Alcohol / Substance use:  Not Applicable Psych involvement (Current and /or in the community):  No (Comment)  Discharge Needs  Concerns to be addressed:  Care Coordination Readmission within the last 30 days:  No Current discharge risk:  Dependent with Mobility, Physical Impairment Barriers to Discharge:  No Barriers Identified   Normajean Baxter, LCSW 10/18/2016, 4:30 PM

## 2016-10-19 DIAGNOSIS — M80052A Age-related osteoporosis with current pathological fracture, left femur, initial encounter for fracture: Secondary | ICD-10-CM | POA: Diagnosis not present

## 2016-10-19 DIAGNOSIS — I10 Essential (primary) hypertension: Secondary | ICD-10-CM | POA: Diagnosis not present

## 2016-10-19 DIAGNOSIS — K219 Gastro-esophageal reflux disease without esophagitis: Secondary | ICD-10-CM | POA: Diagnosis not present

## 2016-10-19 DIAGNOSIS — E785 Hyperlipidemia, unspecified: Secondary | ICD-10-CM | POA: Diagnosis not present

## 2016-10-19 LAB — CBC
HEMATOCRIT: 34.8 % — AB (ref 36.0–46.0)
Hemoglobin: 11.3 g/dL — ABNORMAL LOW (ref 12.0–15.0)
MCH: 29.7 pg (ref 26.0–34.0)
MCHC: 32.5 g/dL (ref 30.0–36.0)
MCV: 91.6 fL (ref 78.0–100.0)
PLATELETS: 256 10*3/uL (ref 150–400)
RBC: 3.8 MIL/uL — ABNORMAL LOW (ref 3.87–5.11)
RDW: 14 % (ref 11.5–15.5)
WBC: 5.5 10*3/uL (ref 4.0–10.5)

## 2016-10-19 LAB — MRSA PCR SCREENING: MRSA by PCR: NEGATIVE

## 2016-10-19 MED ORDER — ACETAMINOPHEN 325 MG PO TABS: 650.0000 mg | ORAL_TABLET | Freq: Three times a day (TID) | ORAL | 0 refills | Status: AC

## 2016-10-19 MED ORDER — HYDROCODONE-ACETAMINOPHEN 5-325 MG PO TABS: 2.0000 | ORAL_TABLET | Freq: Four times a day (QID) | ORAL | 0 refills | Status: AC | PRN

## 2016-10-19 NOTE — Progress Notes (Signed)
   Subjective:  No acute events overnight. Patient states L hip pain is well controlled on current pain regimen. She declined SNF and states she would like to go home and work with [facility] PT. States she has good family support at home. No other complaints this morning.   Objective:  Vital signs in last 24 hours: Vitals:   10/18/16 1213 10/18/16 2126 10/18/16 2220 10/19/16 0433  BP: 130/89 128/74 (!) 157/45 (!) 170/49  Pulse: 62 60 64 68  Resp: Temp: 97.6 F (36.4 C)  98.3 F (36.8 C) 97.7 F (36.5 C)  TempSrc: Oral  Oral Oral  SpO2: 98%  100% 99%  Weight:      Height:       General: pleasant female, well-nourished, well-developed, lying bed in no acute distress Cardiac: regular rate and rhythm, nl S1/S2, no murmurs, rubs or gallops  Pulm: CTAB, no wheezes or crackles, no increased work of breathing  Abd: soft, NTND, bowel sounds present  Neuro: A&Ox3, able to move all 4 extremities Ext: warm and well perfused, no peripheral edema, 2+ DP pulses on R and 1+ on L     Assessment/Plan:  [name] is a 71 yo female with history of 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   # L acetabular hip fracture: Patient presenting with L hip pain after mechanical fall with L acetabular fracture seen on XR and CT. Remains neurovascularly intact and pain well controlled on current regimen. Ortho consulted and recs as below.  - Weight bearing as tolerated and outpatient follow-up in 2 weeks per Dr. Linna Caprice recommendations - Continue home calcium and vitamin D - PT recommended SNF. Patient declined and will do home PT - Pain control: Norco 5-325 mg q6h PRN for pain, increased to 2 tablets today + scheduled Tylenol 650 mg TID + IV Dilaudid 0.1 mg q4h PRN (per ortho) - Bowel  regimen: senokot BID  - Recommend starting bisphosphonate on discharge given fragility fracture. Patient would like to discuss this with her PCP.  - Consider DEXA scan as an outpatient  # Palpitations: TTE nl in 2017. Well controlled on metoprolol. - Continue home metoprolol 25 mg BID   # HTN - Continue home amlodipine 5 mg QD  # GERD - Continue home Protonix 40 mg QD   # Depression - Continue home Zoloft 100 mg QD   F: None  E: will monitor and replete as needed N: HH diet   VTE ppx: SQ Lovenox   Code status: Full code, confirmed on admission    Dispo: Anticipated discharge in approximately 2-3 day(s).   Burna Cash, MD  Internal Medicine PGY-1  P [phone]

## 2016-10-19 NOTE — Care Management Note (Addendum)
Case Management Note  Patient Details  Name: [name] MRN: [id number] Date of Birth: 1945/11/19  Subjective/Objective:   71 yr old female admitted s/p fall with left pelvic fracture.                 Action/Plan: Case manager spoke with patient and her daughter concerning discharge plan. Choice for Home Health agency was offered. They requested same agency that came out in the past. CM called referral to [name], [facility] Liaison.  Patient's daughter, [name] is to be contacted to arrange therapy: [phone].   Expected Discharge Date:  10/19/16               Expected Discharge Plan:  Home w Home Health Services  In-House Referral:  NA  Discharge planning Services  CM Consult  Post Acute Care Choice:  Home Health Choice offered to:  Adult Children, Patient  DME Arranged:   (Has all DME) DME Agency:  NA  HH Arranged:  PT, OT HH Agency:  [facility]  Status of Service:  Completed, signed off  If discussed at Long Length of Stay Meetings, dates discussed:    Additional Comments:  [name], RN 10/19/2016, 12:15 PM

## 2016-10-19 NOTE — Progress Notes (Signed)
  Date: 10/19/2016  Patient name: [name]  Medical record number: [id number]  Date of birth: [birth year]   I have seen and evaluated this patient and I have discussed the plan of care with the house staff. Please see their note for complete details. I concur with their findings with the following additions/corrections: Stable for D/C home. Already has PCP appt next week to discuss bisphosphonate.  Burns Spain, MD 10/19/2016, 1:44 PM

## 2016-10-19 NOTE — Progress Notes (Signed)
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   OT comments  Pt progressing towards goals, completed functional mobility and functional mobility transfers at RW level with MinGuard assist this session and Min verbal cues for safety throughout. Pt requires ModA for LB dressing secondary to adhering to posterior hip precautions from previous THA in May. Pt reports son typically assists with ADL completion at home PRN. Based on Pt's current assist level, feel Pt is safe to return home with 24 hr assist and HHOT services. Will continue to follow acutely.    Follow Up Recommendations  Home health OT;Supervision/Assistance - 24 hour    Equipment Recommendations  None recommended by OT          Precautions / Restrictions Precautions Precautions: Fall Precaution Comments: Posterior hip precautions from THA in May. Pt able to recall 1/3 precautions, verbally reviewed additional precautions  Restrictions Weight Bearing Restrictions: Yes LLE Weight Bearing: Weight bearing as tolerated       Mobility Bed Mobility Overal bed mobility: Needs Assistance Bed Mobility: Supine to Sit;Sit to Supine     Supine to sit: Min assist Sit to supine: Min assist   General bed mobility comments: MinA for LLE management when transferring to sitting EOB and returning to supine; verbal cues for sequencing/technique   Transfers Overall transfer level: Needs assistance Equipment used: Rolling walker (2 wheeled) Transfers: Sit to/from Stand Sit to Stand: Min guard         General  transfer comment: Min Guard A for safety; verbal cues for hand placement     Balance Overall balance assessment: Needs assistance Sitting-balance support: No upper extremity supported;Feet supported Sitting balance-Leahy Scale: Fair     Standing balance support: No upper extremity supported;During functional activity Standing balance-Leahy Scale: Fair Standing balance comment: static standing while advancing underwear over hips with close guard for safety                            ADL either performed or assessed with clinical judgement   ADL Overall ADL's : Needs assistance/impaired                     Lower Body Dressing: Moderate assistance;Sit to/from stand;Adhering to hip precautions;Cueing for safety Lower Body Dressing Details (indicate cue type and reason): cueing for hip precautions; assist to thread LEs into underwear, Pt able to advance underwear over hips while standing at RW with MinGuard  Toilet Transfer: Min guard;Cueing for safety;Ambulation;BSC Toilet Transfer Details (indicate cue type and reason): Cues for safe transfer techniques Toileting- Clothing Manipulation and Hygiene: Min guard;Sit to/from stand Toileting - Architect Details (indicate cue type and reason): Min Guard for Medical sales representative Details (indicate cue type and reason): Pt verbalizes using tub bench to complete tub transfers  Functional mobility during ADLs: Min guard;Rolling walker;Cueing for safety General ADL Comments: Pt requiring Min Guard A for funcitonal mobility for safety. Reviewed hip precautions and techniques for completing ADLs while adhering to precautions. Pt requiring cues for fall prevention and  safety; declined bathing/grooming ADLs this session, declined education on AE for ADL completion as Pt reports son assists with ADL completion for adherence to hip precautions                       Cognition Arousal/Alertness:  Awake/alert Behavior During Therapy: WFL for tasks assessed/performed Overall Cognitive Status: Within Functional Limits for tasks assessed                                                            Pertinent Vitals/ Pain       Pain Assessment: Faces Faces Pain Scale: Hurts little more Pain Location: L hip/groin Pain Descriptors / Indicators: Constant;Discomfort;Grimacing;Guarding Pain Intervention(s): Limited activity within patient's tolerance;Monitored during session;Repositioned                                                          Frequency  Min 2X/week        Progress Toward Goals  OT Goals(current goals can now be found in the care plan section)  Progress towards OT goals: Progressing toward goals  Acute Rehab OT Goals Patient Stated Goal: to go home  OT Goal Formulation: With patient Time For Goal Achievement: 11/01/16 Potential to Achieve Goals: Good  Plan Discharge plan remains appropriate                     AM-PAC PT "6 Clicks" Daily Activity     Outcome Measure   Help from another person eating meals?: None Help from another person taking care of personal grooming?: A Little Help from another person toileting, which includes using toliet, bedpan, or urinal?: A Little Help from another person bathing (including washing, rinsing, drying)?: A Lot Help from another person to put on and taking off regular upper body clothing?: None Help from another person to put on and taking off regular lower body clothing?: A Lot 6 Click Score: 18    End of Session Equipment Utilized During Treatment: Gait belt;Rolling walker  OT Visit Diagnosis: Unsteadiness on feet (R26.81);Other abnormalities of gait and mobility (R26.89);History of falling (Z91.81);Pain;Muscle weakness (generalized) (M62.81) Pain - Right/Left: Left Pain - part of body: Hip   Activity Tolerance Patient tolerated treatment well   Patient  Left in bed;with call bell/phone within reach   Nurse Communication Mobility status    Functional Assessment Tool Used: AM-PAC 6 Clicks Daily Activity;Clinical judgement Functional Limitation: Self care Self Care Current Status (Z6109): At least 20 percent but less than 40 percent impaired, limited or restricted Self Care Goal Status (U0454): At least 1 percent but less than 20 percent impaired, limited or restricted   Time: 0981-1914 OT Time Calculation (min): 17 min  Charges: OT G-codes **NOT FOR INPATIENT CLASS** Functional Assessment Tool Used: AM-PAC 6 Clicks Daily Activity;Clinical judgement Functional Limitation: Self care Self Care Current Status (N8295): At least 20 percent but less than 40 percent impaired, limited or restricted Self Care Goal Status (A2130): At least 1 percent but less than 20 percent impaired, limited or restricted OT General Charges $OT Visit: 1 Visit OT Treatments $Self Care/Home  Management : 8-22 mins  Marcy Siren, [place] Pager [phone] 10/19/2016    Orlando Penner 10/19/2016, 10:43 AM

## 2016-10-19 NOTE — Social Work (Signed)
CSW met with patient this morning to discuss bed offers as patient desires to go home and CSW discussed having back up plan just in case. Pt selected [facility]. CSW faxed clinicals to [facility] to begin the auth process just in case SNF is the final recommendation.  CSW called daughter to confirm the choice of [facility].  CSW will continue to follow as patient desires to return home at discharge.  Elissa Hefty, LCSW Clinical Social Worker [phone]

## 2016-10-19 NOTE — Discharge Instructions (Signed)
You were admitted to [facility] for a fracture in your left hip. Please continue to work with physical therapy at home. This will improve movement in your left leg. You were prescribed the medication Norco 5-325 mg for pain control. Please take 1 or 2 tablets every 6 hours as needed for pain.   Dr. Linna Caprice, your orthopredic surgeon, recommended bearing weight on the left leg as you tolerate it. You will also follow up with him in 2 weeks.   Please follow up with your primary care doctor. I have asked them to see you sooner than your scheduled appointment on 10/9. They will be calling you with new appointment date and time. Ask your doctor if it would be good for you to start a medication class called bisphosphonates and VitaminD  to make your bones stronger.

## 2016-10-19 NOTE — Care Management Obs Status (Signed)
MEDICARE OBSERVATION STATUS NOTIFICATION   Patient Details  Name: [name] MRN: [id number] Date of Birth: July 24, 1945   Medicare Observation Status Notification Given:  Yes    [name], RN 10/19/2016, 12:39 PM

## 2016-10-19 NOTE — Discharge Summary (Signed)
Name: [name] MRN: [id number] DOB: 1945/10/31 71 y.o. PCP: [facility]  Date of Admission: 10/17/2016  9:37 AM Date of Discharge: 10/19/2016 Attending Physician: Burns Spain, MD  Discharge Diagnosis: 1. Left puboacetabular fracture   Principal Problem:   Left Acetabular Fracture, Closed, 10/17/16, LC 1 Active Problems:   Left hip pain   Discharge Medications: Allergies as of 10/19/2016      Reactions   Sulfa Antibiotics Hives      Medication List    TAKE these medications   acetaminophen 325 MG tablet Commonly known as:  TYLENOL Take 2 tablets (650 mg total) by mouth 3 (three) times daily.   amLODipine 5 MG tablet Commonly known as:  NORVASC Take 1 tablet (5 mg total) by mouth daily.   aspirin 325 MG tablet Take 325 mg by mouth daily.   CALCIUM 600 + D PO Take 1 tablet by mouth daily.   docusate sodium 100 MG capsule Commonly known as:  COLACE Take 1 capsule (100 mg total) by mouth 2 (two) times daily.   HYDROcodone-acetaminophen 5-325 MG tablet Commonly known as:  NORCO/VICODIN Take 2 tablets by mouth every 6 (six) hours as needed for severe pain. What changed:  how much to take  when to take this  reasons to take this   ibuprofen 200 MG tablet Commonly known as:  ADVIL,MOTRIN Take 400 mg by mouth every 4 (four) hours as needed for headache or moderate pain.   metoprolol tartrate 25 MG tablet Commonly known as:  LOPRESSOR Take 1 tablet (25 mg total) by mouth 2 (two) times daily.   ondansetron 4 MG tablet Commonly known as:  ZOFRAN Take 1 tablet (4 mg total) by mouth every 6 (six) hours as needed for nausea.   pantoprazole 40 MG tablet Commonly known as:  PROTONIX Take 40 mg by mouth daily. Before breakfast   senna 8.6 MG Tabs tablet Commonly known as:  SENOKOT Take 2 tablets (17.2 mg total) by mouth at bedtime.   sertraline 100 MG tablet Commonly known as:  ZOLOFT Take 100 mg by mouth daily.              Discharge Care Instructions        Start     Ordered   10/19/16 0000  acetaminophen (TYLENOL) 325 MG tablet  3 times daily     10/19/16 1132   10/19/16 0000  HYDROcodone-acetaminophen (NORCO/VICODIN) 5-325 MG tablet  Every 6 hours PRN     10/19/16 1132   10/19/16 0000  Increase activity slowly     10/19/16 1132   10/19/16 0000  Diet - low sodium heart healthy     10/19/16 1132   10/19/16 0000  Call MD for:  severe uncontrolled pain     10/19/16 1132      Disposition and follow-up:   Ms.Sammantha L Osburn was discharged from [facility] in Stable condition.  At the hospital follow up visit please address:  1.  Please assess for L hip pain and difficulty with ambulation and weight bearing. Given admission for fragility fracture, consider starting Vitamin D and a bisphosphonate at hospital follow up appointment.   2.  Labs / imaging needed at time of follow-up: None   3.  Pending labs/ test needing follow-up: None  Follow-up Appointments:  Contact information for follow-up providers    Swinteck, Arlys John, MD. Schedule an appointment as soon as possible for a visit in 2 week(s).  Specialty:  Orthopedic Surgery Contact information: [address] [place] [address] [phone]        [facility] Follow up.   Contact information: [address] [place] [address] [phone]            Contact information for after-discharge care    Destination    [facility] .   Specialty:  Skilled Nursing Facility Contact information: [address] [place] [address] [phone]                  Hospital Course by problem list:   Samyria [name]. 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  1. L hip fracture: Patient presented with left-sided hip pain after mechanical fall from standing at home was found to have a left hip puboacetabular fracture on CT (see full report below). She was seen by her orthopedic surgeon, Dr. Linna Caprice, who recommended weightbearing as tolerated and outpatient follow-up in 2 weeks. Pain was treated with Norco 5, scheduled Tylenol and IV Dilaudid 0.1 MG PRN. She was evaluated by PT who recommended skilled nursing facility. However patient declined and was discharged home with home health PT as she lives home with her son and has good social support. She declined assisting devices stating that she has everything she needs at home. She was given Norco 5 for 7 days for pain and instructed to take 1-2 tablets every 6 hours as needed. She was also encouraged to discuss starting vitamin D and a bisphosphonate with her PCP given admission for fragility fracture.  Patient was continued on all of her home medications for her chronic medical problems.  Discharge Vitals:   BP (!) 170/49 (BP Location: Left Arm)   Pulse 68   Temp 97.7 F (36.5 C) (Oral)   Resp 16   Ht  (1.575 m)   Wt 134 lb 4.2 oz (60.9 kg)   SpO2 99%   BMI 24.56 kg/m   Pertinent Labs, Studies, and Procedures:   L hip XR: FINDINGS: Status post left total hip arthroplasty. Moderately displaced medial left acetabular fracture is now noted. Right hip appears normal. Vascular calcifications are noted.   L hip CT: IMPRESSION: 1. Acute, minimally displaced fracture of the left puboacetabular junction. Small left pelvic sidewall hematoma. 2. Acute nondisplaced fracture of the left inferior pubic ramus. 3. Postsurgical changes related to left total hip arthroplastywithout evidence of hardware failure or loosening.    Discharge Instructions: Discharge Instructions    Call MD for:  severe uncontrolled pain    Complete by:   As directed    Diet - low sodium heart healthy    Complete by:  As directed    Increase activity slowly    Complete by:  As directed       Signed: Burna Cash, MD  Internal Medicine PGY-1  P [phone]

## 2016-10-19 NOTE — Progress Notes (Signed)
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    PT Comments    Pt much improved from yesterday however con't to be impulsive and unsafe with functional mobility requiring 24/7 assist as pt is a increased falls risk. Acute PT to con't to follow.   Follow Up Recommendations  Home health PT;Supervision/Assistance - 24 hour (if pt doesn't have 24/7 asssist pt will need ST-SNF)     Equipment Recommendations  None recommended by PT    Recommendations for Other Services       Precautions / Restrictions Precautions Precautions: Fall Precaution Comments: Posterior hip precautions from [facility] in May. Pt able to recall 1/3 precautions, verbally reviewed additional precautions  Restrictions Weight Bearing Restrictions: Yes LLE Weight Bearing: Weight bearing as tolerated    Mobility  Bed Mobility Overal bed mobility: Needs Assistance Bed Mobility: Supine to Sit     Supine to sit: Modified independent (Device/Increase time) Sit to supine: Min assist   General bed mobility comments: pt rolled to the R and used bed rail to pull self up at EOB. pt managed LEs off EOB without physical assist  Transfers Overall transfer level: Needs assistance Equipment used: Rolling walker (2 wheeled) Transfers: Sit to/from Stand Sit to Stand: Min guard         General transfer comment: v/c's for safe hand placement, increased time  Ambulation/Gait Ambulation/Gait assistance: Min guard Ambulation Distance (Feet): 100 Feet Assistive device: Rolling walker (2 wheeled) Gait  Pattern/deviations: Step-to pattern;Decreased stride length Gait velocity: slow Gait velocity interpretation: Below normal speed for age/gender General Gait Details: attempted to increase fluidity of gait pattern however pt insistant on step to gait pattern. pt with incraseed bilat UE discomfort due to dependence on bilat UEs.   Stairs Stairs: Yes   Stair Management: One rail Left;Step to pattern;Sideways Number of Stairs: 2 (x2) General stair comments: pt pulling up with both handrails initially despite education that she doesn't have 2 rails at home. pt very stubborn initially but then demo'd safe technique, dtr present  Wheelchair Mobility    Modified Rankin (Stroke Patients Only)       Balance Overall balance assessment: Needs assistance Sitting-balance support: No upper extremity supported;Feet supported Sitting balance-Leahy Scale: Fair     Standing balance support: No upper extremity supported;During functional activity Standing balance-Leahy Scale: Fair Standing balance comment: static standing while advancing underwear over hips with close guard for safety                             Cognition Arousal/Alertness: Awake/alert Behavior During Therapy: Impulsive Overall Cognitive Status: Impaired/Different from baseline Area of Impairment: Safety/judgement;Problem solving                         Safety/Judgement: Decreased awareness of safety   Problem Solving: Decreased initiation;Difficulty sequencing General Comments: pt with poor walker management and impulsive on stairs requiring max v/c's for safety      Exercises General Exercises - Lower Extremity Quad Sets: AROM;Both;10 reps;Supine Gluteal Sets: AROM;Both;10  reps;Supine Heel Slides: AROM;Left;10 reps;Seated    General Comments        Pertinent Vitals/Pain Pain Assessment: 0-10 Pain Score: 6  Faces Pain Scale: Hurts little more Pain Location: L hip/groin Pain Descriptors /  Indicators: Constant;Discomfort;Grimacing;Guarding Pain Intervention(s): Monitored during session    Home Living                      Prior Function            PT Goals (current goals can now be found in the care plan section) Acute Rehab PT Goals Patient Stated Goal: go home Progress towards PT goals: Progressing toward goals    Frequency    Min 3X/week      PT Plan Discharge plan needs to be updated    Co-evaluation              AM-PAC PT "6 Clicks" Daily Activity  Outcome Measure  Difficulty turning over in bed (including adjusting bedclothes, sheets and blankets)?: Unable Difficulty moving from lying on back to sitting on the side of the bed? : Unable Difficulty sitting down on and standing up from a chair with arms (e.g., wheelchair, bedside commode, etc,.)?: A Little Help needed moving to and from a bed to chair (including a wheelchair)?: A Little Help needed walking in hospital room?: A Little Help needed climbing 3-5 steps with a railing? : A Lot 6 Click Score: 13    End of Session Equipment Utilized During Treatment: Gait belt Activity Tolerance: Patient tolerated treatment well Patient left: in chair;with call bell/phone within reach;with family/visitor present Nurse Communication: Mobility status;Patient requests pain meds PT Visit Diagnosis: Unsteadiness on feet (R26.81);History of falling (Z91.81);Pain Pain - Right/Left: Left Pain - part of body: Hip     Time: 9562-1308 PT Time Calculation (min) (ACUTE ONLY): 24 min  Charges:  $Gait Training: 8-22 mins $Therapeutic Exercise: 8-22 mins                    G Codes:       Lewis Shock, PT, DPT Pager #: [phone] Office #: [phone]    [name] 10/19/2016, 12:29 PM

## 2016-12-31 DEATH — deceased
